# Patient Record
Sex: Male | Born: 1970 | Race: Black or African American | Hispanic: No | Smoking: Former smoker
Health system: Southern US, Community
[De-identification: ages and names within clinical notes are randomized; demographics above are authoritative.]

## PROBLEM LIST (undated history)

## (undated) DIAGNOSIS — N289 Disorder of kidney and ureter, unspecified: Secondary | ICD-10-CM

## (undated) DIAGNOSIS — G4730 Sleep apnea, unspecified: Secondary | ICD-10-CM

## (undated) DIAGNOSIS — J45909 Unspecified asthma, uncomplicated: Secondary | ICD-10-CM

## (undated) DIAGNOSIS — I10 Essential (primary) hypertension: Secondary | ICD-10-CM

## (undated) DIAGNOSIS — Z87442 Personal history of urinary calculi: Secondary | ICD-10-CM

## (undated) DIAGNOSIS — F32A Depression, unspecified: Secondary | ICD-10-CM

## (undated) DIAGNOSIS — D571 Sickle-cell disease without crisis: Secondary | ICD-10-CM

## (undated) DIAGNOSIS — I517 Cardiomegaly: Secondary | ICD-10-CM

## (undated) DIAGNOSIS — I739 Peripheral vascular disease, unspecified: Secondary | ICD-10-CM

## (undated) DIAGNOSIS — D573 Sickle-cell trait: Secondary | ICD-10-CM

## (undated) HISTORY — PX: NO PAST SURGERIES: SHX2092

---

## 1999-04-07 ENCOUNTER — Emergency Department (HOSPITAL_COMMUNITY): Admission: EM | Admit: 1999-04-07 | Discharge: 1999-04-07 | Payer: Self-pay | Admitting: Emergency Medicine

## 1999-11-30 ENCOUNTER — Encounter: Payer: Self-pay | Admitting: Internal Medicine

## 1999-11-30 ENCOUNTER — Emergency Department (HOSPITAL_COMMUNITY): Admission: EM | Admit: 1999-11-30 | Discharge: 1999-11-30 | Payer: Self-pay | Admitting: Internal Medicine

## 2000-07-22 ENCOUNTER — Emergency Department (HOSPITAL_COMMUNITY): Admission: EM | Admit: 2000-07-22 | Discharge: 2000-07-23 | Payer: Self-pay | Admitting: Emergency Medicine

## 2000-07-22 ENCOUNTER — Encounter: Payer: Self-pay | Admitting: Emergency Medicine

## 2015-03-12 ENCOUNTER — Emergency Department (HOSPITAL_COMMUNITY): Payer: Non-veteran care

## 2015-03-12 ENCOUNTER — Inpatient Hospital Stay (HOSPITAL_COMMUNITY)
Admission: EM | Admit: 2015-03-12 | Discharge: 2015-03-19 | DRG: 304 | Disposition: A | Discharge status: Home / Self Care | Payer: Non-veteran care | Attending: Internal Medicine | Admitting: Internal Medicine

## 2015-03-12 ENCOUNTER — Encounter (HOSPITAL_COMMUNITY): Payer: Self-pay | Admitting: Emergency Medicine

## 2015-03-12 DIAGNOSIS — M549 Dorsalgia, unspecified: Secondary | ICD-10-CM

## 2015-03-12 DIAGNOSIS — N9489 Other specified conditions associated with female genital organs and menstrual cycle: Secondary | ICD-10-CM

## 2015-03-12 DIAGNOSIS — R52 Pain, unspecified: Secondary | ICD-10-CM | POA: Diagnosis not present

## 2015-03-12 DIAGNOSIS — Z8249 Family history of ischemic heart disease and other diseases of the circulatory system: Secondary | ICD-10-CM

## 2015-03-12 DIAGNOSIS — F1210 Cannabis abuse, uncomplicated: Secondary | ICD-10-CM | POA: Diagnosis not present

## 2015-03-12 DIAGNOSIS — I10 Essential (primary) hypertension: Secondary | ICD-10-CM

## 2015-03-12 DIAGNOSIS — Z79899 Other long term (current) drug therapy: Secondary | ICD-10-CM | POA: Diagnosis not present

## 2015-03-12 DIAGNOSIS — R079 Chest pain, unspecified: Secondary | ICD-10-CM | POA: Diagnosis not present

## 2015-03-12 DIAGNOSIS — I129 Hypertensive chronic kidney disease with stage 1 through stage 4 chronic kidney disease, or unspecified chronic kidney disease: Secondary | ICD-10-CM | POA: Diagnosis present

## 2015-03-12 DIAGNOSIS — M5440 Lumbago with sciatica, unspecified side: Secondary | ICD-10-CM | POA: Diagnosis not present

## 2015-03-12 DIAGNOSIS — N289 Disorder of kidney and ureter, unspecified: Secondary | ICD-10-CM

## 2015-03-12 DIAGNOSIS — K5900 Constipation, unspecified: Secondary | ICD-10-CM | POA: Diagnosis present

## 2015-03-12 DIAGNOSIS — N186 End stage renal disease: Secondary | ICD-10-CM

## 2015-03-12 DIAGNOSIS — I701 Atherosclerosis of renal artery: Secondary | ICD-10-CM | POA: Diagnosis not present

## 2015-03-12 DIAGNOSIS — N182 Chronic kidney disease, stage 2 (mild): Secondary | ICD-10-CM | POA: Diagnosis present

## 2015-03-12 DIAGNOSIS — N179 Acute kidney failure, unspecified: Secondary | ICD-10-CM | POA: Diagnosis present

## 2015-03-12 DIAGNOSIS — D571 Sickle-cell disease without crisis: Secondary | ICD-10-CM | POA: Diagnosis present

## 2015-03-12 DIAGNOSIS — J45909 Unspecified asthma, uncomplicated: Secondary | ICD-10-CM | POA: Diagnosis present

## 2015-03-12 DIAGNOSIS — I7773 Dissection of renal artery: Secondary | ICD-10-CM | POA: Diagnosis present

## 2015-03-12 DIAGNOSIS — F1290 Cannabis use, unspecified, uncomplicated: Secondary | ICD-10-CM

## 2015-03-12 DIAGNOSIS — I160 Hypertensive urgency: Principal | ICD-10-CM | POA: Diagnosis present

## 2015-03-12 DIAGNOSIS — I776 Arteritis, unspecified: Secondary | ICD-10-CM

## 2015-03-12 HISTORY — DX: Sickle-cell disease without crisis: D57.1

## 2015-03-12 HISTORY — DX: Disorder of kidney and ureter, unspecified: N28.9

## 2015-03-12 HISTORY — DX: Unspecified asthma, uncomplicated: J45.909

## 2015-03-12 HISTORY — DX: Essential (primary) hypertension: I10

## 2015-03-12 LAB — URINALYSIS, ROUTINE W REFLEX MICROSCOPIC
Bilirubin Urine: NEGATIVE
GLUCOSE, UA: NEGATIVE mg/dL
Ketones, ur: NEGATIVE mg/dL
LEUKOCYTES UA: NEGATIVE
Nitrite: NEGATIVE
PH: 5.5 (ref 5.0–8.0)
Specific Gravity, Urine: 1.024 (ref 1.005–1.030)
Urobilinogen, UA: 0.2 mg/dL (ref 0.0–1.0)

## 2015-03-12 LAB — CREATININE, SERUM
CREATININE: 2.15 mg/dL — AB (ref 0.61–1.24)
GFR, EST AFRICAN AMERICAN: 41 mL/min — AB (ref >60)
GFR, EST NON AFRICAN AMERICAN: 35 mL/min — AB (ref >60)

## 2015-03-12 LAB — RAPID URINE DRUG SCREEN, HOSP PERFORMED
AMPHETAMINES: NOT DETECTED
BENZODIAZEPINES: NOT DETECTED
Barbiturates: NOT DETECTED
Cocaine: NOT DETECTED
Opiates: NOT DETECTED
TETRAHYDROCANNABINOL: POSITIVE — AB

## 2015-03-12 LAB — CBC WITH DIFFERENTIAL/PLATELET
BASOS ABS: 0 10*3/uL (ref 0.0–0.1)
Basophils Relative: 0 %
Eosinophils Absolute: 0.6 10*3/uL (ref 0.0–0.7)
Eosinophils Relative: 6 %
HEMATOCRIT: 44.9 % (ref 39.0–52.0)
Hemoglobin: 15.5 g/dL (ref 13.0–17.0)
LYMPHS ABS: 2.4 10*3/uL (ref 0.7–4.0)
LYMPHS PCT: 22 %
MCH: 29.3 pg (ref 26.0–34.0)
MCHC: 34.5 g/dL (ref 30.0–36.0)
MCV: 84.9 fL (ref 78.0–100.0)
Monocytes Absolute: 0.5 10*3/uL (ref 0.1–1.0)
Monocytes Relative: 5 %
NEUTROS ABS: 7.3 10*3/uL (ref 1.7–7.7)
Neutrophils Relative %: 67 %
Platelets: 276 10*3/uL (ref 150–400)
RBC: 5.29 MIL/uL (ref 4.22–5.81)
RDW: 12.1 % (ref 11.5–15.5)
WBC: 10.9 10*3/uL — ABNORMAL HIGH (ref 4.0–10.5)

## 2015-03-12 LAB — TSH: TSH: 1.597 u[IU]/mL (ref 0.350–4.500)

## 2015-03-12 LAB — CBC
HCT: 39.9 % (ref 39.0–52.0)
Hemoglobin: 13.6 g/dL (ref 13.0–17.0)
MCH: 29.3 pg (ref 26.0–34.0)
MCHC: 34.1 g/dL (ref 30.0–36.0)
MCV: 86 fL (ref 78.0–100.0)
PLATELETS: 242 10*3/uL (ref 150–400)
RBC: 4.64 MIL/uL (ref 4.22–5.81)
RDW: 12.1 % (ref 11.5–15.5)
WBC: 13.1 10*3/uL — AB (ref 4.0–10.5)

## 2015-03-12 LAB — URINE MICROSCOPIC-ADD ON

## 2015-03-12 LAB — MAGNESIUM: MAGNESIUM: 2 mg/dL (ref 1.7–2.4)

## 2015-03-12 LAB — I-STAT CHEM 8, ED
BUN: 24 mg/dL — ABNORMAL HIGH (ref 6–20)
CALCIUM ION: 1.1 mmol/L — AB (ref 1.12–1.23)
CHLORIDE: 100 mmol/L — AB (ref 101–111)
CREATININE: 1.8 mg/dL — AB (ref 0.61–1.24)
GLUCOSE: 95 mg/dL (ref 65–99)
HCT: 50 % (ref 39.0–52.0)
Hemoglobin: 17 g/dL (ref 13.0–17.0)
POTASSIUM: 3.7 mmol/L (ref 3.5–5.1)
Sodium: 138 mmol/L (ref 135–145)
TCO2: 29 mmol/L (ref 0–100)

## 2015-03-12 LAB — HEPATIC FUNCTION PANEL
ALBUMIN: 3.4 g/dL — AB (ref 3.5–5.0)
ALK PHOS: 48 U/L (ref 38–126)
ALT: 15 U/L — AB (ref 17–63)
AST: 18 U/L (ref 15–41)
BILIRUBIN TOTAL: 0.7 mg/dL (ref 0.3–1.2)
Bilirubin, Direct: <0.1 mg/dL — ABNORMAL LOW (ref 0.1–0.5)
TOTAL PROTEIN: 7 g/dL (ref 6.5–8.1)

## 2015-03-12 LAB — SEDIMENTATION RATE: SED RATE: 10 mm/h (ref 0–16)

## 2015-03-12 LAB — TROPONIN I: Troponin I: <0.03 ng/mL (ref <0.031)

## 2015-03-12 LAB — C-REACTIVE PROTEIN: CRP: 1.2 mg/dL — AB (ref <1.0)

## 2015-03-12 MED ORDER — LORAZEPAM 2 MG/ML IJ SOLN: 1.0000 mg | Freq: Four times a day (QID) | INTRAMUSCULAR | Status: AC | PRN

## 2015-03-12 MED ORDER — NITROGLYCERIN IN D5W 200-5 MCG/ML-% IV SOLN
5.0000 ug/min | INTRAVENOUS | Status: DC
  Administered 2015-03-12: 20 ug/min via INTRAVENOUS
  Filled 2015-03-12: qty 250

## 2015-03-12 MED ORDER — SODIUM CHLORIDE 0.9 % IV SOLN
INTRAVENOUS | Status: DC
  Administered 2015-03-12 – 2015-03-13 (×2): via INTRAVENOUS

## 2015-03-12 MED ORDER — MOMETASONE FURO-FORMOTEROL FUM 100-5 MCG/ACT IN AERO
2.0000 | INHALATION_SPRAY | Freq: Two times a day (BID) | RESPIRATORY_TRACT | Status: DC
  Administered 2015-03-13 – 2015-03-19 (×11): 2 via RESPIRATORY_TRACT
  Filled 2015-03-12 (×2): qty 8.8

## 2015-03-12 MED ORDER — SODIUM CHLORIDE 0.9 % IJ SOLN
3.0000 mL | Freq: Two times a day (BID) | INTRAMUSCULAR | Status: DC
  Administered 2015-03-12 – 2015-03-18 (×8): 3 mL via INTRAVENOUS

## 2015-03-12 MED ORDER — MOMETASONE FURO-FORMOTEROL FUM 100-5 MCG/ACT IN AERO
2.0000 | INHALATION_SPRAY | Freq: Two times a day (BID) | RESPIRATORY_TRACT | Status: DC
  Filled 2015-03-12: qty 8.8

## 2015-03-12 MED ORDER — TAMSULOSIN HCL 0.4 MG PO CAPS
0.4000 mg | ORAL_CAPSULE | Freq: Every day | ORAL | Status: DC
  Administered 2015-03-13 – 2015-03-14 (×2): 0.4 mg via ORAL
  Filled 2015-03-12 (×2): qty 1

## 2015-03-12 MED ORDER — HEPARIN SODIUM (PORCINE) 5000 UNIT/ML IJ SOLN: 5000.0000 [IU] | Freq: Three times a day (TID) | INTRAMUSCULAR | Status: DC

## 2015-03-12 MED ORDER — HYDROMORPHONE HCL 1 MG/ML IJ SOLN
0.5000 mg | INTRAMUSCULAR | Status: DC | PRN
  Administered 2015-03-13 – 2015-03-19 (×20): 0.5 mg via INTRAVENOUS
  Filled 2015-03-12 (×20): qty 1

## 2015-03-12 MED ORDER — ADULT MULTIVITAMIN W/MINERALS CH
1.0000 | ORAL_TABLET | Freq: Every day | ORAL | Status: DC
  Administered 2015-03-12 – 2015-03-19 (×8): 1 via ORAL
  Filled 2015-03-12 (×8): qty 1

## 2015-03-12 MED ORDER — ALBUTEROL SULFATE (2.5 MG/3ML) 0.083% IN NEBU: 3.0000 mL | INHALATION_SOLUTION | Freq: Four times a day (QID) | RESPIRATORY_TRACT | Status: DC | PRN

## 2015-03-12 MED ORDER — OMEGA-3-ACID ETHYL ESTERS 1 G PO CAPS
1.0000 g | ORAL_CAPSULE | Freq: Two times a day (BID) | ORAL | Status: DC
  Administered 2015-03-12 – 2015-03-19 (×14): 1 g via ORAL
  Filled 2015-03-12 (×17): qty 1

## 2015-03-12 MED ORDER — HYDRALAZINE HCL 50 MG PO TABS
100.0000 mg | ORAL_TABLET | Freq: Three times a day (TID) | ORAL | Status: DC
  Administered 2015-03-12 – 2015-03-19 (×21): 100 mg via ORAL
  Filled 2015-03-12 (×22): qty 2

## 2015-03-12 MED ORDER — HEPARIN (PORCINE) IN NACL 100-0.45 UNIT/ML-% IJ SOLN
1800.0000 [IU]/h | INTRAMUSCULAR | Status: DC
  Administered 2015-03-12: 1100 [IU]/h via INTRAVENOUS
  Administered 2015-03-13: 1300 [IU]/h via INTRAVENOUS
  Administered 2015-03-13: 1350 [IU]/h via INTRAVENOUS
  Administered 2015-03-14: 1750 [IU]/h via INTRAVENOUS
  Administered 2015-03-14: 1450 [IU]/h via INTRAVENOUS
  Administered 2015-03-16 – 2015-03-19 (×5): 1800 [IU]/h via INTRAVENOUS
  Filled 2015-03-12 (×11): qty 250

## 2015-03-12 MED ORDER — LABETALOL HCL 5 MG/ML IV SOLN
20.0000 mg | INTRAVENOUS | Status: AC | PRN
  Administered 2015-03-12 (×5): 20 mg via INTRAVENOUS
  Filled 2015-03-12 (×5): qty 4

## 2015-03-12 MED ORDER — ONDANSETRON HCL 4 MG PO TABS: 4.0000 mg | ORAL_TABLET | Freq: Four times a day (QID) | ORAL | Status: DC | PRN

## 2015-03-12 MED ORDER — LABETALOL HCL 5 MG/ML IV SOLN
5.0000 mg | INTRAVENOUS | Status: DC | PRN
  Administered 2015-03-13 – 2015-03-15 (×7): 5 mg via INTRAVENOUS
  Filled 2015-03-12 (×7): qty 4

## 2015-03-12 MED ORDER — ONDANSETRON HCL 4 MG/2ML IJ SOLN
4.0000 mg | Freq: Four times a day (QID) | INTRAMUSCULAR | Status: DC | PRN
Start: 2015-03-12 — End: 2015-03-19
  Filled 2015-03-12 (×2): qty 2

## 2015-03-12 MED ORDER — THIAMINE HCL 100 MG/ML IJ SOLN
100.0000 mg | Freq: Every day | INTRAMUSCULAR | Status: DC
  Filled 2015-03-12: qty 2

## 2015-03-12 MED ORDER — IOHEXOL 350 MG/ML SOLN
100.0000 mL | Freq: Once | INTRAVENOUS | Status: AC | PRN
  Administered 2015-03-12: 80 mL via INTRAVENOUS

## 2015-03-12 MED ORDER — VITAMIN B-1 100 MG PO TABS
100.0000 mg | ORAL_TABLET | Freq: Every day | ORAL | Status: DC
  Administered 2015-03-12 – 2015-03-19 (×8): 100 mg via ORAL
  Filled 2015-03-12 (×8): qty 1

## 2015-03-12 MED ORDER — LORAZEPAM 1 MG PO TABS: 1.0000 mg | ORAL_TABLET | Freq: Four times a day (QID) | ORAL | Status: AC | PRN

## 2015-03-12 MED ORDER — FOLIC ACID 1 MG PO TABS
1.0000 mg | ORAL_TABLET | Freq: Every day | ORAL | Status: DC
  Administered 2015-03-12 – 2015-03-19 (×8): 1 mg via ORAL
  Filled 2015-03-12 (×8): qty 1

## 2015-03-12 NOTE — ED Notes (Signed)
Report attempted 

## 2015-03-12 NOTE — H&P (Addendum)
[facility] History and Physical  Dylan Sweeney [id number] DOB: 08/10/1970 DOA: 03/12/2015  Referring physician: ER  PCP: No primary care provider on file.   Chief Complaint: Lower back pain  HPI:  44 year old male with a history of chronic kidney disease stage II followed at the [facility] in [place], hypertension, asthma which is under control, presents to the ER with left flank pain. Blood pressure upon arrival was 222/148. Flank pain is described as dull, constant pain, not particularly worse with movement, not reproducible to palpation. Denies any history of nephrolithiasis. Patient received 5 doses of IV labetalol, subsequently started on a nitroglycerin drip. CT scan which was performed to rule out dissection showed an irregularity and focal narrowing of the left renal artery with surrounding fat stranding. Findings nonspecific but ddx include renal artery dissection or vasculitis. Patient has a history of sickle cell trait, admits to using marijuana, denies cocaine use, drinks beer on a daily basis.       Review of Systems: negative for the following  Constitutional: Denies fever, chills, diaphoresis, appetite change and fatigue.  HEENT: Denies photophobia, eye pain, redness, hearing loss, ear pain, congestion, sore throat, rhinorrhea, sneezing, mouth sores, trouble swallowing, neck pain, neck stiffness and tinnitus.  Respiratory: Denies SOB, DOE, cough, chest tightness, and wheezing.  Cardiovascular: Denies chest pain, palpitations and leg swelling.  Gastrointestinal: Denies nausea, vomiting, abdominal pain, diarrhea, constipation, blood in stool and abdominal distention.  Genitourinary: Denies dysuria, urgency, frequency, hematuria, flank pain and difficulty urinating.  Musculoskeletal: Positive for back pain and neck stiffness. Negative for myalgias and arthralgias.  Skin: Denies pallor, rash and wound.  Neurological: Denies dizziness, seizures, syncope,  weakness, light-headedness, numbness and headaches.  Hematological: Denies adenopathy. Easy bruising, personal or family bleeding history  Psychiatric/Behavioral: Denies suicidal ideation, mood changes, confusion, nervousness, sleep disturbance and agitation       Past Medical History  Diagnosis Date  . Hypertension   . Renal disorder   . Asthma   . Renal insufficiency   . Sickle cell anemia (HCC)     Has the trait-per patient (03/12/15)     History reviewed. No pertinent past surgical history.    Social History:  reports that he has never smoked. He does not have any smokeless tobacco history on file. He reports that he drinks alcohol. He reports that he does not use illicit drugs.    Allergies  Allergen Reactions  . Other Other (See Comments)    G6 pill allergy: pill given before travel in [place]  When questioned  Directly-patient reports  No family history of HTN, CVA ,DIABETES, TB, Cancer CAD, Bleeding Disorder , diabetes, anemia, asthma,  Positive for sickle cell disease in his son and mother   Prior to Admission medications   Medication Sig Start Date End Date Taking? Authorizing Provider  albuterol (PROVENTIL HFA;VENTOLIN HFA) 108 (90 BASE) MCG/ACT inhaler Inhale into the lungs every 6 (six) hours as needed for wheezing or shortness of breath.   Yes Historical Provider, MD  cholecalciferol (VITAMIN D) 1000 UNITS tablet Take 1,000 Units by mouth daily.   Yes Historical Provider, MD  Fluticasone-Salmeterol (ADVAIR) 250-50 MCG/DOSE AEPB Inhale 1 puff into the lungs 2 (two) times daily.   Yes Historical Provider, MD  hydrALAZINE (APRESOLINE) 25 MG tablet Take 25 mg by mouth 2 (two) times daily.   Yes Historical Provider, MD  hydrochlorothiazide (HYDRODIURIL) 25 MG tablet Take 25 mg by mouth daily.  Yes Historical Provider, MD  losartan (COZAAR) 50 MG tablet Take 50 mg by mouth daily.   Yes Historical Provider, MD  omega-3 acid ethyl esters  (LOVAZA) 1 G capsule Take by mouth 2 (two) times daily.   Yes Historical Provider, MD  spironolactone (ALDACTONE) 50 MG tablet Take 50 mg by mouth daily.   Yes Historical Provider, MD  tamsulosin (FLOMAX) 0.4 MG CAPS capsule Take 0.4 mg by mouth.   Yes Historical Provider, MD     Physical Exam: Filed Vitals:   03/12/15 1830 03/12/15 1845 03/12/15 1900 03/12/15 1915  BP: 184/151 170/127 173/125 175/130  Pulse: 84 87 88 84  Temp:      TempSrc:      Resp:      Height:      Weight:      SpO2: 92% 91% 91% 95%    Constitutional: He is oriented to person, place, and time. He appears well-developed and well-nourished.  No acute distress  Neck:  Full ROM without pain No meningeal signs No midline tenderness Mild tenderness of paraspinal musculature  Cardiovascular: Normal rate, regular rhythm, normal heart sounds and intact distal pulses. Exam reveals no gallop and no friction rub.  No murmur heard. Pulmonary/Chest: Effort normal and breath sounds normal. No respiratory distress. He has no wheezes. He has no rales.  Abdominal: Soft. Bowel sounds are normal. He exhibits no distension. There is no tenderness.  Musculoskeletal:  Gait is not antalgic; patient is able to ambulate without difficulty.  No noted deformities or signs of inflammation. Curvature of cervical, thoracic, and lumbar spine within normal limits. No midline tenderness; no tenderness to palpation of lumbar paraspinal musculature Full lumbar ROM without pain Straight leg raises are negative bilaterally for radicular symptoms. 5/5 muscle strength of bilateral LE's  Neurological: He is alert and oriented to person, place, and time. He has normal reflexes.  Bilateral lower extremities neurovascularly intact.  Neurological: A&O x3, Strenght is normal and symmetric bilaterally, cranial nerve II-XII are grossly intact, no focal motor deficit, sensory intact to light touch bilaterally.  Skin: Warm, dry and intact. No rash,  cyanosis, or clubbing.  Psychiatric: Normal mood and affect. speech and behavior is normal. Judgment and thought content normal. Cognition and memory are normal.      Data Review   Micro Results No results found for this or any previous visit (from the past 240 hour(s)).  Radiology Reports Ct Angio Chest Aorta W/cm &/or Wo/cm  03/12/2015  CLINICAL DATA:  Patient with back pain. Evaluate for aortic dissection. EXAM: CT ANGIOGRAPHY CHEST, ABDOMEN AND PELVIS TECHNIQUE: Multidetector CT imaging of the chest, abdomen and pelvis was performed using the standard protocol during bolus administration of intravenous contrast. Multiplanar CT image reconstructions and MIPs were obtained to evaluate the vascular anatomy. CONTRAST:  53mL OMNIPAQUE IOHEXOL 350 MG/ML SOLN COMPARISON:  None. FINDINGS: CTA CHEST FINDINGS Mediastinum/Nodes: Noncontrast imaging through the thorax demonstrates no peripheral high attenuation within the thoracic aorta to suggest intramural hematoma formation. Post contrast-enhanced images demonstrate motion artifact limiting evaluation of the aortic root. The thoracic aorta is opacified with contrast material. No evidence for acute thoracic aortic dissection. The takeoff of the great vessels are patent. Visualized thyroid is unremarkable. No enlarged axillary, mediastinal or hilar lymphadenopathy. Heart is normal in size. No pericardial effusion. Aorta and main pulmonary artery normal in caliber. No evidence for central pulmonary embolism. Coronary arterial vascular calcifications. Lungs/Pleura: The central airways are patent. No consolidative pulmonary opacities. No pleural effusion or  pneumothorax. Review of the MIP images confirms the above findings. CTA ABDOMEN FINDINGS Hepatobiliary: Liver is normal in size and contour. No focal hepatic lesion is identified. The gallbladder is unremarkable. No intrahepatic or extrahepatic biliary ductal dilatation. Pancreas: Unremarkable Spleen:  Unremarkable Adrenals/Urinary Tract: The adrenal glands are normal. The kidneys enhance symmetrically with contrast. Multiple bilateral low-attenuation subcentimeter renal lesions are demonstrated, too small to accurately characterize. There is circumferential wall thickening of the urinary bladder. Stomach/Bowel: No abnormal bowel wall thickening or evidence for bowel obstruction. No free fluid or free intraperitoneal air. No evidence for bowel obstruction. Vascular/Lymphatic: The abdominal aorta is patent and normal in caliber. There is fat stranding about the left renal artery with apparent focal areas of narrowing, demonstrated best on sagittal images (image 103; series 503). The branch vessels including the celiac, superior mesenteric and inferior mesenteric arteries are patent. The common iliac vessels as well as the internal and external iliac vessels are patent. Other: Prostate enlarged. Musculoskeletal: No aggressive or acute appearing osseous lesions. Review of the MIP images confirms the above findings. IMPRESSION: There is irregularity and focal narrowing of the left renal artery with with surrounding fat stranding. There is some motion artifact limiting evaluation of this location. Overall findings are nonspecific however the top differential considerations would be focal left renal artery dissection or vasculitis. Additional but less likely consideration would be left renal artery infection in the setting of pyelonephritis. On single phase contrast-enhanced exam, the left kidney appears perfused. Critical Value/emergent results were called by telephone at the time of interpretation on 03/12/2015 at 3:28 pm to Dr. Eulis Foster, who verbally acknowledged these results. Electronically Signed   By: Lovey Newcomer M.D.   On: 03/12/2015 15:30   Ct Angio Abd/pel W/ And/or W/o  03/12/2015  CLINICAL DATA:  Patient with back pain. Evaluate for aortic dissection. EXAM: CT ANGIOGRAPHY CHEST, ABDOMEN AND PELVIS TECHNIQUE:  Multidetector CT imaging of the chest, abdomen and pelvis was performed using the standard protocol during bolus administration of intravenous contrast. Multiplanar CT image reconstructions and MIPs were obtained to evaluate the vascular anatomy. CONTRAST:  77mL OMNIPAQUE IOHEXOL 350 MG/ML SOLN COMPARISON:  None. FINDINGS: CTA CHEST FINDINGS Mediastinum/Nodes: Noncontrast imaging through the thorax demonstrates no peripheral high attenuation within the thoracic aorta to suggest intramural hematoma formation. Post contrast-enhanced images demonstrate motion artifact limiting evaluation of the aortic root. The thoracic aorta is opacified with contrast material. No evidence for acute thoracic aortic dissection. The takeoff of the great vessels are patent. Visualized thyroid is unremarkable. No enlarged axillary, mediastinal or hilar lymphadenopathy. Heart is normal in size. No pericardial effusion. Aorta and main pulmonary artery normal in caliber. No evidence for central pulmonary embolism. Coronary arterial vascular calcifications. Lungs/Pleura: The central airways are patent. No consolidative pulmonary opacities. No pleural effusion or pneumothorax. Review of the MIP images confirms the above findings. CTA ABDOMEN FINDINGS Hepatobiliary: Liver is normal in size and contour. No focal hepatic lesion is identified. The gallbladder is unremarkable. No intrahepatic or extrahepatic biliary ductal dilatation. Pancreas: Unremarkable Spleen: Unremarkable Adrenals/Urinary Tract: The adrenal glands are normal. The kidneys enhance symmetrically with contrast. Multiple bilateral low-attenuation subcentimeter renal lesions are demonstrated, too small to accurately characterize. There is circumferential wall thickening of the urinary bladder. Stomach/Bowel: No abnormal bowel wall thickening or evidence for bowel obstruction. No free fluid or free intraperitoneal air. No evidence for bowel obstruction. Vascular/Lymphatic: The  abdominal aorta is patent and normal in caliber. There is fat stranding about the left renal artery  with apparent focal areas of narrowing, demonstrated best on sagittal images (image 103; series 503). The branch vessels including the celiac, superior mesenteric and inferior mesenteric arteries are patent. The common iliac vessels as well as the internal and external iliac vessels are patent. Other: Prostate enlarged. Musculoskeletal: No aggressive or acute appearing osseous lesions. Review of the MIP images confirms the above findings. IMPRESSION: There is irregularity and focal narrowing of the left renal artery with with surrounding fat stranding. There is some motion artifact limiting evaluation of this location. Overall findings are nonspecific however the top differential considerations would be focal left renal artery dissection or vasculitis. Additional but less likely consideration would be left renal artery infection in the setting of pyelonephritis. On single phase contrast-enhanced exam, the left kidney appears perfused. Critical Value/emergent results were called by telephone at the time of interpretation on 03/12/2015 at 3:28 pm to Dr. Eulis Foster, who verbally acknowledged these results. Electronically Signed   By: Lovey Newcomer M.D.   On: 03/12/2015 15:30     CBC  Recent Labs Lab 03/12/15 1335 03/12/15 1345  WBC 10.9*  --   HGB 15.5 17.0  HCT 44.9 50.0  PLT 276  --   MCV 84.9  --   MCH 29.3  --   MCHC 34.5  --   RDW 12.1  --   LYMPHSABS 2.4  --   MONOABS 0.5  --   EOSABS 0.6  --   BASOSABS 0.0  --     Chemistries   Recent Labs Lab 03/12/15 1345  NA 138  K 3.7  CL 100*  GLUCOSE 95  BUN 24*  CREATININE 1.80*   ------------------------------------------------------------------------------------------------------------------ estimated creatinine clearance is 54.6 mL/min (by C-G formula based on Cr of  1.8). ------------------------------------------------------------------------------------------------------------------ No results for input(s): HGBA1C in the last 72 hours. ------------------------------------------------------------------------------------------------------------------ No results for input(s): CHOL, HDL, LDLCALC, TRIG, CHOLHDL, LDLDIRECT in the last 72 hours. ------------------------------------------------------------------------------------------------------------------ No results for input(s): TSH, T4TOTAL, T3FREE, THYROIDAB in the last 72 hours.  Invalid input(s): FREET3 ------------------------------------------------------------------------------------------------------------------ No results for input(s): VITAMINB12, FOLATE, FERRITIN, TIBC, IRON, RETICCTPCT in the last 72 hours.  Coagulation profile No results for input(s): INR, PROTIME in the last 168 hours.  No results for input(s): DDIMER in the last 72 hours.  Cardiac Enzymes No results for input(s): CKMB, TROPONINI, MYOGLOBIN in the last 168 hours.  Invalid input(s): CK ------------------------------------------------------------------------------------------------------------------ Invalid input(s): POCBNP   CBG: No results for input(s): GLUCAP in the last 168 hours.     EKG: Independently reviewed.    Assessment/Plan Active Problems:    Hypertensive urgency, malignant-in the setting of known history of hypertension, patient on HCTZ, Cozaar, Aldactone, hydralazine at home. He states that he has been compliant with his medications. CT imaging concerning for focal narrowing of the left renal artery, concern for focal left renal artery dissection vs vasculitis. Will obtain nephrology consultation, start MRA of the renal arteries to further evaluate for renal artery dissection, as well as order a vasculitic workup including antibody panel for   vasculitis. Increased dose of hydralazine, continue when  necessary labetalol, taper off nitroglycerin drip Admit patient to step down I have   called Dr. Florene Glen , he recommended to call vascular , also discussed with Dr Trula Slade for further recommendations.he does not recommend MRI/MRA tonight. He recommends anticoagulation with heparin, and repeat a scan in 48 hours to confirm findings. He will see the patient in the morning. I have also paged Dr. Daryll Brod with interventional radiology, to consult and  make further recommendations,  He recommends anticoagulation, will start heparin and wait for further recommendations from IR and vascular .    Marijuana use-counseling done  Asthma-continue Advair, albuterol,  Alcohol abuse patient started on CIWA protocol  Code Status:   full Family Communication: bedside Disposition Plan: admit   Total time spent 55 minutes.Greater than 50% of this time was spent in counseling, explanation of diagnosis, planning of further management, and coordination of care  [facility] Pager [phone]  If 7PM-7AM, please contact night-coverage [email or web address] Password [facility] 03/12/2015, 7:41 PM

## 2015-03-12 NOTE — Progress Notes (Signed)
ANTICOAGULATION CONSULT NOTE - Initial Consult  Pharmacy Consult for Heparin Indication: Renal artery dissection  Allergies  Allergen Reactions  . Other Other (See Comments)    G6 pill allergy: pill given before travel in [place]    Patient Measurements: Height: 5\' 11"  (180.3 cm) Weight: 190 lb (86.183 kg) IBW/kg (Calculated) : 75.3   Vital Signs: Temp: 98.2 F (36.8 C) (11/08 1141) Temp Source: Oral (11/08 1141) BP: 153/115 mmHg (11/08 2015) Pulse Rate: 82 (11/08 2015)  Labs:  Recent Labs  03/12/15 1335 03/12/15 1345  HGB 15.5 17.0  HCT 44.9 50.0  PLT 276  --   CREATININE  --  1.80*    Estimated Creatinine Clearance: 54.6 mL/min (by C-G formula based on Cr of 1.8).   Medical History: Past Medical History  Diagnosis Date  . Hypertension   . Renal disorder   . Asthma   . Renal insufficiency   . Sickle cell anemia (HCC)     Has the trait-per patient (03/12/15)    Assessment: 44 year old male with a history of chronic kidney disease stage II  hypertension, asthma,  presented to the ER with left flank pain.  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

## 2015-03-12 NOTE — ED Provider Notes (Signed)
CSN: [id number]     Arrival date & time 03/12/15  1114 History   First MD Initiated Contact with Patient 03/12/15 1302     Chief Complaint  Patient presents with  . Hypertension  . Flank Pain     (Consider location/radiation/quality/duration/timing/severity/associated sxs/prior Treatment) The history is provided by the patient and medical records. No language interpreter was used.   KEDRIC SETTERS is a 44 y.o. male  with a PMH of HTN, CKD stage 2 presents to the Emergency Department complaining of constant, dull, achy left lower back pain that has been worsening since Friday (11/04). Patient states he has a history of low back pain in the past, however this feels different from his usual symptoms. 10/10 at worst, 5/10 at present. Associated symptoms include neck stiffness. Of note, patient missed his home regimen of blood pressure medications this morning.     Past Medical History  Diagnosis Date  . Hypertension   . Renal disorder   . Asthma   . Renal insufficiency   . Sickle cell anemia (HCC)     Has the trait-per patient (03/12/15)   History reviewed. No pertinent past surgical history. No family history on file. Social History  Substance Use Topics  . Smoking status: Never Smoker   . Smokeless tobacco: None  . Alcohol Use: Yes     Comment: 6 pack/week    Review of Systems  Constitutional: Negative.   HENT: Negative for congestion, rhinorrhea and sore throat.   Eyes: Negative for visual disturbance.  Respiratory: Negative for cough, shortness of breath and wheezing.   Cardiovascular: Negative.   Gastrointestinal: Negative for nausea, vomiting, abdominal pain, diarrhea and constipation.  Endocrine: Negative for polydipsia and polyuria.  Musculoskeletal: Positive for back pain and neck stiffness. Negative for myalgias and arthralgias.  Skin: Negative for rash.  Neurological: Negative for dizziness, weakness and headaches.      Allergies  Other  Home Medications    Prior to Admission medications   Medication Sig Start Date End Date Taking? Authorizing Provider  albuterol (PROVENTIL HFA;VENTOLIN HFA) 108 (90 BASE) MCG/ACT inhaler Inhale into the lungs every 6 (six) hours as needed for wheezing or shortness of breath.   Yes Historical Provider, MD  cholecalciferol (VITAMIN D) 1000 UNITS tablet Take 1,000 Units by mouth daily.   Yes Historical Provider, MD  Fluticasone-Salmeterol (ADVAIR) 250-50 MCG/DOSE AEPB Inhale 1 puff into the lungs 2 (two) times daily.   Yes Historical Provider, MD  hydrALAZINE (APRESOLINE) 25 MG tablet Take 25 mg by mouth 2 (two) times daily.   Yes Historical Provider, MD  hydrochlorothiazide (HYDRODIURIL) 25 MG tablet Take 25 mg by mouth daily.   Yes Historical Provider, MD  losartan (COZAAR) 50 MG tablet Take 50 mg by mouth daily.   Yes Historical Provider, MD  omega-3 acid ethyl esters (LOVAZA) 1 G capsule Take by mouth 2 (two) times daily.   Yes Historical Provider, MD  spironolactone (ALDACTONE) 50 MG tablet Take 50 mg by mouth daily.   Yes Historical Provider, MD  tamsulosin (FLOMAX) 0.4 MG CAPS capsule Take 0.4 mg by mouth.   Yes Historical Provider, MD   BP 186/128 mmHg  Pulse 88  Temp(Src) 98.2 F (36.8 C) (Oral)  Resp 18  Ht 5\' 11"  (1.803 m)  Wt 190 lb (86.183 kg)  BMI 26.51 kg/m2  SpO2 92% Physical Exam  Constitutional: He is oriented to person, place, and time. He appears well-developed and well-nourished.  No acute distress  Neck:  Full ROM without pain No meningeal signs No midline tenderness Mild tenderness of paraspinal musculature  Cardiovascular: Normal rate, regular rhythm, normal heart sounds and intact distal pulses.  Exam reveals no gallop and no friction rub.   No murmur heard. Pulmonary/Chest: Effort normal and breath sounds normal. No respiratory distress. He has no wheezes. He has no rales.  Abdominal: Soft. Bowel sounds are normal. He exhibits no distension. There is no tenderness.   Musculoskeletal:  Gait is not antalgic; patient is able to ambulate without difficulty.  No noted deformities or signs of inflammation. Curvature of cervical, thoracic, and lumbar spine within normal limits. No midline tenderness; no tenderness to palpation of lumbar paraspinal musculature Full lumbar ROM without pain Straight leg raises are negative bilaterally for radicular symptoms. 5/5 muscle strength of bilateral LE's   Neurological: He is alert and oriented to person, place, and time. He has normal reflexes.  Bilateral lower extremities neurovascularly intact.  Skin: Skin is warm and dry. No rash noted. No erythema.  Nursing note and vitals reviewed.   ED Course  Procedures (including critical care time) Labs Review Labs Reviewed  CBC WITH DIFFERENTIAL/PLATELET - Abnormal; Notable for the following:    WBC 10.9 (*)    All other components within normal limits  URINALYSIS, ROUTINE W REFLEX MICROSCOPIC (NOT AT [facility]) - Abnormal; Notable for the following:    Hgb urine dipstick MODERATE (*)    Protein, ur >300 (*)    All other components within normal limits  URINE RAPID DRUG SCREEN, HOSP PERFORMED - Abnormal; Notable for the following:    Tetrahydrocannabinol POSITIVE (*)    All other components within normal limits  I-STAT CHEM 8, ED - Abnormal; Notable for the following:    Chloride 100 (*)    BUN 24 (*)    Creatinine, Ser 1.80 (*)    Calcium, Ion 1.10 (*)    All other components within normal limits  URINE MICROSCOPIC-ADD ON    Imaging Review Ct Angio Chest Aorta W/cm &/or Wo/cm  03/12/2015  CLINICAL DATA:  Patient with back pain. Evaluate for aortic dissection. EXAM: CT ANGIOGRAPHY CHEST, ABDOMEN AND PELVIS TECHNIQUE: Multidetector CT imaging of the chest, abdomen and pelvis was performed using the standard protocol during bolus administration of intravenous contrast. Multiplanar CT image reconstructions and MIPs were obtained to evaluate the vascular anatomy.  CONTRAST:  46mL OMNIPAQUE IOHEXOL 350 MG/ML SOLN COMPARISON:  None. FINDINGS: CTA CHEST FINDINGS Mediastinum/Nodes: Noncontrast imaging through the thorax demonstrates no peripheral high attenuation within the thoracic aorta to suggest intramural hematoma formation. Post contrast-enhanced images demonstrate motion artifact limiting evaluation of the aortic root. The thoracic aorta is opacified with contrast material. No evidence for acute thoracic aortic dissection. The takeoff of the great vessels are patent. Visualized thyroid is unremarkable. No enlarged axillary, mediastinal or hilar lymphadenopathy. Heart is normal in size. No pericardial effusion. Aorta and main pulmonary artery normal in caliber. No evidence for central pulmonary embolism. Coronary arterial vascular calcifications. Lungs/Pleura: The central airways are patent. No consolidative pulmonary opacities. No pleural effusion or pneumothorax. Review of the MIP images confirms the above findings. CTA ABDOMEN FINDINGS Hepatobiliary: Liver is normal in size and contour. No focal hepatic lesion is identified. The gallbladder is unremarkable. No intrahepatic or extrahepatic biliary ductal dilatation. Pancreas: Unremarkable Spleen: Unremarkable Adrenals/Urinary Tract: The adrenal glands are normal. The kidneys enhance symmetrically with contrast. Multiple bilateral low-attenuation subcentimeter renal lesions are demonstrated, too small to accurately characterize. There is circumferential wall thickening of  the urinary bladder. Stomach/Bowel: No abnormal bowel wall thickening or evidence for bowel obstruction. No free fluid or free intraperitoneal air. No evidence for bowel obstruction. Vascular/Lymphatic: The abdominal aorta is patent and normal in caliber. There is fat stranding about the left renal artery with apparent focal areas of narrowing, demonstrated best on sagittal images (image 103; series 503). The branch vessels including the celiac, superior  mesenteric and inferior mesenteric arteries are patent. The common iliac vessels as well as the internal and external iliac vessels are patent. Other: Prostate enlarged. Musculoskeletal: No aggressive or acute appearing osseous lesions. Review of the MIP images confirms the above findings. IMPRESSION: There is irregularity and focal narrowing of the left renal artery with with surrounding fat stranding. There is some motion artifact limiting evaluation of this location. Overall findings are nonspecific however the top differential considerations would be focal left renal artery dissection or vasculitis. Additional but less likely consideration would be left renal artery infection in the setting of pyelonephritis. On single phase contrast-enhanced exam, the left kidney appears perfused. Critical Value/emergent results were called by telephone at the time of interpretation on 03/12/2015 at 3:28 pm to Dr. Eulis Foster, who verbally acknowledged these results. Electronically Signed   By: Lovey Newcomer M.D.   On: 03/12/2015 15:30   Ct Angio Abd/pel W/ And/or W/o  03/12/2015  CLINICAL DATA:  Patient with back pain. Evaluate for aortic dissection. EXAM: CT ANGIOGRAPHY CHEST, ABDOMEN AND PELVIS TECHNIQUE: Multidetector CT imaging of the chest, abdomen and pelvis was performed using the standard protocol during bolus administration of intravenous contrast. Multiplanar CT image reconstructions and MIPs were obtained to evaluate the vascular anatomy. CONTRAST:  66mL OMNIPAQUE IOHEXOL 350 MG/ML SOLN COMPARISON:  None. FINDINGS: CTA CHEST FINDINGS Mediastinum/Nodes: Noncontrast imaging through the thorax demonstrates no peripheral high attenuation within the thoracic aorta to suggest intramural hematoma formation. Post contrast-enhanced images demonstrate motion artifact limiting evaluation of the aortic root. The thoracic aorta is opacified with contrast material. No evidence for acute thoracic aortic dissection. The takeoff of the  great vessels are patent. Visualized thyroid is unremarkable. No enlarged axillary, mediastinal or hilar lymphadenopathy. Heart is normal in size. No pericardial effusion. Aorta and main pulmonary artery normal in caliber. No evidence for central pulmonary embolism. Coronary arterial vascular calcifications. Lungs/Pleura: The central airways are patent. No consolidative pulmonary opacities. No pleural effusion or pneumothorax. Review of the MIP images confirms the above findings. CTA ABDOMEN FINDINGS Hepatobiliary: Liver is normal in size and contour. No focal hepatic lesion is identified. The gallbladder is unremarkable. No intrahepatic or extrahepatic biliary ductal dilatation. Pancreas: Unremarkable Spleen: Unremarkable Adrenals/Urinary Tract: The adrenal glands are normal. The kidneys enhance symmetrically with contrast. Multiple bilateral low-attenuation subcentimeter renal lesions are demonstrated, too small to accurately characterize. There is circumferential wall thickening of the urinary bladder. Stomach/Bowel: No abnormal bowel wall thickening or evidence for bowel obstruction. No free fluid or free intraperitoneal air. No evidence for bowel obstruction. Vascular/Lymphatic: The abdominal aorta is patent and normal in caliber. There is fat stranding about the left renal artery with apparent focal areas of narrowing, demonstrated best on sagittal images (image 103; series 503). The branch vessels including the celiac, superior mesenteric and inferior mesenteric arteries are patent. The common iliac vessels as well as the internal and external iliac vessels are patent. Other: Prostate enlarged. Musculoskeletal: No aggressive or acute appearing osseous lesions. Review of the MIP images confirms the above findings. IMPRESSION: There is irregularity and focal narrowing of the left renal  artery with with surrounding fat stranding. There is some motion artifact limiting evaluation of this location. Overall  findings are nonspecific however the top differential considerations would be focal left renal artery dissection or vasculitis. Additional but less likely consideration would be left renal artery infection in the setting of pyelonephritis. On single phase contrast-enhanced exam, the left kidney appears perfused. Critical Value/emergent results were called by telephone at the time of interpretation on 03/12/2015 at 3:28 pm to Dr. Eulis Foster, who verbally acknowledged these results. Electronically Signed   By: Lovey Newcomer M.D.   On: 03/12/2015 15:30   I have personally reviewed and evaluated these images and lab results as part of my medical decision-making.   EKG Interpretation None      MDM   Final diagnoses:  Back pain  Hypertensive urgency  Vasculitis ([place])  Renal insufficiency   [name] presents with ~ 4 days of worsening back pain that is not reproducible.  BP is markedly elevated: 208-222/148-153 -- Will order CT to r/o dissection   Labs: UA: > 300 protein, moderate hgb, Chem 8: cl 100, BUN 24, Cr 1.8, Ca ion 1.1; CBC: WBC 10.9 Imaging: CT showed an irregularity and focal narrowing of the left renal artery with surrounding fat stranding. Findings nonspecific but ddx include renal artery dissection or vasculitis.  BP - 193/131 after 3 doses of 20mg  labetalol 5 doses of labetalol tried with little improvement. Will start nitro drip  Patient to be admitted to hospitalist for further evaluation and care.   Patient seen by and discussed with Dr. Eulis Foster who agrees with treatment plan.    [facility] Shyah Cadmus, PA-C 03/12/15 1837  Daleen Bo, MD 03/13/15 [phone]

## 2015-03-12 NOTE — ED Provider Notes (Signed)
Face-to-face evaluation   History: Presents for evaluation of left mid to low back pain. He did not take his medication this morning. He has intermittent ongoing chronic low back pain.  Physical exam: Alert, calm, cooperative. Abdomen is soft and nontender. Normal range of motion  Back. Neuro- alert and oriented 3. Affect appropriate.  Medications  nitroGLYCERIN 50 mg in dextrose 5 % 250 mL (0.2 mg/mL) infusion (not administered)  iohexol (OMNIPAQUE) 350 MG/ML injection 100 mL (80 mLs Intravenous Contrast Given 03/12/15 1356)  labetalol (NORMODYNE,TRANDATE) injection 20 mg (20 mg Intravenous Given 03/12/15 1653)    Patient Vitals for the past 24 hrs:  BP Temp Temp src Pulse Resp SpO2 Height Weight  03/12/15 1745 (!) 186/128 mmHg - - 88 - 92 % - -  03/12/15 1730 (!) 170/116 mmHg - - 86 - 94 % - -  03/12/15 1715 (!) 181/121 mmHg - - 87 - 93 % - -  03/12/15 1700 (!) 183/124 mmHg - - 102 - 94 % - -  03/12/15 1654 (!) 182/137 mmHg - - 88 - 94 % - -  03/12/15 1645 (!) 193/131 mmHg - - 85 - 94 % - -  03/12/15 1630 (!) 197/136 mmHg - - 88 - 98 % - -  03/12/15 1615 (!) 204/142 mmHg - - 83 - 97 % - -  03/12/15 1600 (!) 209/147 mmHg - - - - - - -  03/12/15 1545 (!) 207/142 mmHg - - 92 - 94 % - -  03/12/15 1514 (!) 206/151 mmHg - - 88 - 96 % - -  03/12/15 1315 (!) 213/161 mmHg - - 98 - 94 % - -  03/12/15 1300 (!) 208/145 mmHg - - 86 - 97 % - -  03/12/15 1230 (!) 212/153 mmHg - - 89 - 96 % - -  03/12/15 1141 (!) 222/148 mmHg 98.2 F (36.8 C) Oral 61 18 95 % 5\' 11"  (1.803 m) 190 lb (86.183 kg)    6:23 PM Reevaluation with update and discussion. After initial assessment and treatment, an updated evaluation rev. He continues to have significant hypertension, mean arterial pressure has improved from 171-148, but remains markedly elevated. Nitroglycerin drip has been ordered. Dayn Barich L  6:24 PM-Consult complete with Hospitalist. Patient case explained and discussed. She agrees to admit patient  for further evaluation and treatment. Call ended at [place] Performed by: Richarda Blade Total critical care time: 50 minutes minutes Critical care time was exclusive of separately billable procedures and treating other patients. Critical care was necessary to treat or prevent imminent or life-threatening deterioration. Critical care was time spent personally by me on the following activities: development of treatment plan with patient and/or surrogate as well as nursing, discussions with consultants, evaluation of patient's response to treatment, examination of patient, obtaining history from patient or surrogate, ordering and performing treatments and interventions, ordering and review of laboratory studies, ordering and review of radiographic studies, pulse oximetry and re-evaluation of patient's condition.  Ct Angio Chest Aorta W/cm &/or Wo/cm  03/12/2015  CLINICAL DATA:  Patient with back pain. Evaluate for aortic dissection. EXAM: CT ANGIOGRAPHY CHEST, ABDOMEN AND PELVIS TECHNIQUE: Multidetector CT imaging of the chest, abdomen and pelvis was performed using the standard protocol during bolus administration of intravenous contrast. Multiplanar CT image reconstructions and MIPs were obtained to evaluate the vascular anatomy. CONTRAST:  19mL OMNIPAQUE IOHEXOL 350 MG/ML SOLN COMPARISON:  None. FINDINGS: CTA CHEST FINDINGS Mediastinum/Nodes: Noncontrast imaging through the thorax demonstrates no peripheral  high attenuation within the thoracic aorta to suggest intramural hematoma formation. Post contrast-enhanced images demonstrate motion artifact limiting evaluation of the aortic root. The thoracic aorta is opacified with contrast material. No evidence for acute thoracic aortic dissection. The takeoff of the great vessels are patent. Visualized thyroid is unremarkable. No enlarged axillary, mediastinal or hilar lymphadenopathy. Heart is normal in size. No pericardial effusion. Aorta and main  pulmonary artery normal in caliber. No evidence for central pulmonary embolism. Coronary arterial vascular calcifications. Lungs/Pleura: The central airways are patent. No consolidative pulmonary opacities. No pleural effusion or pneumothorax. Review of the MIP images confirms the above findings. CTA ABDOMEN FINDINGS Hepatobiliary: Liver is normal in size and contour. No focal hepatic lesion is identified. The gallbladder is unremarkable. No intrahepatic or extrahepatic biliary ductal dilatation. Pancreas: Unremarkable Spleen: Unremarkable Adrenals/Urinary Tract: The adrenal glands are normal. The kidneys enhance symmetrically with contrast. Multiple bilateral low-attenuation subcentimeter renal lesions are demonstrated, too small to accurately characterize. There is circumferential wall thickening of the urinary bladder. Stomach/Bowel: No abnormal bowel wall thickening or evidence for bowel obstruction. No free fluid or free intraperitoneal air. No evidence for bowel obstruction. Vascular/Lymphatic: The abdominal aorta is patent and normal in caliber. There is fat stranding about the left renal artery with apparent focal areas of narrowing, demonstrated best on sagittal images (image 103; series 503). The branch vessels including the celiac, superior mesenteric and inferior mesenteric arteries are patent. The common iliac vessels as well as the internal and external iliac vessels are patent. Other: Prostate enlarged. Musculoskeletal: No aggressive or acute appearing osseous lesions. Review of the MIP images confirms the above findings. IMPRESSION: There is irregularity and focal narrowing of the left renal artery with with surrounding fat stranding. There is some motion artifact limiting evaluation of this location. Overall findings are nonspecific however the top differential considerations would be focal left renal artery dissection or vasculitis. Additional but less likely consideration would be left renal  artery infection in the setting of pyelonephritis. On single phase contrast-enhanced exam, the left kidney appears perfused. Critical Value/emergent results were called by telephone at the time of interpretation on 03/12/2015 at 3:28 pm to Dr. Eulis Foster, who verbally acknowledged these results. Electronically Signed   By: Lovey Newcomer M.D.   On: 03/12/2015 15:30   Ct Angio Abd/pel W/ And/or W/o  03/12/2015  CLINICAL DATA:  Patient with back pain. Evaluate for aortic dissection. EXAM: CT ANGIOGRAPHY CHEST, ABDOMEN AND PELVIS TECHNIQUE: Multidetector CT imaging of the chest, abdomen and pelvis was performed using the standard protocol during bolus administration of intravenous contrast. Multiplanar CT image reconstructions and MIPs were obtained to evaluate the vascular anatomy. CONTRAST:  57mL OMNIPAQUE IOHEXOL 350 MG/ML SOLN COMPARISON:  None. FINDINGS: CTA CHEST FINDINGS Mediastinum/Nodes: Noncontrast imaging through the thorax demonstrates no peripheral high attenuation within the thoracic aorta to suggest intramural hematoma formation. Post contrast-enhanced images demonstrate motion artifact limiting evaluation of the aortic root. The thoracic aorta is opacified with contrast material. No evidence for acute thoracic aortic dissection. The takeoff of the great vessels are patent. Visualized thyroid is unremarkable. No enlarged axillary, mediastinal or hilar lymphadenopathy. Heart is normal in size. No pericardial effusion. Aorta and main pulmonary artery normal in caliber. No evidence for central pulmonary embolism. Coronary arterial vascular calcifications. Lungs/Pleura: The central airways are patent. No consolidative pulmonary opacities. No pleural effusion or pneumothorax. Review of the MIP images confirms the above findings. CTA ABDOMEN FINDINGS Hepatobiliary: Liver is normal in size and contour. No focal  hepatic lesion is identified. The gallbladder is unremarkable. No intrahepatic or extrahepatic biliary  ductal dilatation. Pancreas: Unremarkable Spleen: Unremarkable Adrenals/Urinary Tract: The adrenal glands are normal. The kidneys enhance symmetrically with contrast. Multiple bilateral low-attenuation subcentimeter renal lesions are demonstrated, too small to accurately characterize. There is circumferential wall thickening of the urinary bladder. Stomach/Bowel: No abnormal bowel wall thickening or evidence for bowel obstruction. No free fluid or free intraperitoneal air. No evidence for bowel obstruction. Vascular/Lymphatic: The abdominal aorta is patent and normal in caliber. There is fat stranding about the left renal artery with apparent focal areas of narrowing, demonstrated best on sagittal images (image 103; series 503). The branch vessels including the celiac, superior mesenteric and inferior mesenteric arteries are patent. The common iliac vessels as well as the internal and external iliac vessels are patent. Other: Prostate enlarged. Musculoskeletal: No aggressive or acute appearing osseous lesions. Review of the MIP images confirms the above findings. IMPRESSION: There is irregularity and focal narrowing of the left renal artery with with surrounding fat stranding. There is some motion artifact limiting evaluation of this location. Overall findings are nonspecific however the top differential considerations would be focal left renal artery dissection or vasculitis. Additional but less likely consideration would be left renal artery infection in the setting of pyelonephritis. On single phase contrast-enhanced exam, the left kidney appears perfused. Critical Value/emergent results were called by telephone at the time of interpretation on 03/12/2015 at 3:28 pm to Dr. Eulis Foster, who verbally acknowledged these results. Electronically Signed   By: Lovey Newcomer M.D.   On: 03/12/2015 15:30   Medical decision-making- marked blood pressure elevation secondary to medication noncompliance. Back pain is nonspecific but  may be related to a renal artery abnormality on the left side. The CT findings were nondiagnostic. He may require evaluation and further workup by vascular surgery. He has mild renal insufficiency. There no baseline labs for comparison.  Diagnoses that have been ruled out:  None  Diagnoses that are still under consideration:  None  Final diagnoses:  Back pain  Hypertensive urgency  Vasculitis [facility])  Renal insufficiency     Medical screening examination/treatment/procedure(s) were conducted as a shared visit with non-physician practitioner(s) and myself.  I personally evaluated the patient during the encounter  Daleen Bo, MD 03/12/15 [phone]

## 2015-03-12 NOTE — ED Notes (Signed)
Patient states "I need to have my back checked to make sure it's not my kidneys".  Patient denies urinary symptoms or other issues, but bp is 222/148.   Patient states he has not taken his htn medications today.

## 2015-03-12 NOTE — ED Notes (Signed)
Pt screened for [facility]. "I want to make sure my back pain is not coming from my kidneys.Marland KitchenMarland KitchenI have kidney disease".

## 2015-03-12 NOTE — ED Notes (Signed)
Meal tray ordered 

## 2015-03-13 ENCOUNTER — Inpatient Hospital Stay (HOSPITAL_COMMUNITY): Payer: Non-veteran care

## 2015-03-13 DIAGNOSIS — R079 Chest pain, unspecified: Secondary | ICD-10-CM

## 2015-03-13 LAB — TROPONIN I
Troponin I: <0.03 ng/mL (ref <0.031)
Troponin I: <0.03 ng/mL (ref <0.031)

## 2015-03-13 LAB — PROTEIN / CREATININE RATIO, URINE
Creatinine, Urine: 122.62 mg/dL
PROTEIN CREATININE RATIO: 0.93 mg/mg{creat} — AB (ref 0.00–0.15)
Total Protein, Urine: 114 mg/dL

## 2015-03-13 LAB — COMPREHENSIVE METABOLIC PANEL
ALBUMIN: 3.3 g/dL — AB (ref 3.5–5.0)
ALK PHOS: 47 U/L (ref 38–126)
ALT: 14 U/L — ABNORMAL LOW (ref 17–63)
AST: 17 U/L (ref 15–41)
Anion gap: 8 (ref 5–15)
BUN: 17 mg/dL (ref 6–20)
CALCIUM: 8.9 mg/dL (ref 8.9–10.3)
CO2: 28 mmol/L (ref 22–32)
CREATININE: 1.91 mg/dL — AB (ref 0.61–1.24)
Chloride: 100 mmol/L — ABNORMAL LOW (ref 101–111)
GFR calc Af Amer: 47 mL/min — ABNORMAL LOW (ref >60)
GFR calc non Af Amer: 40 mL/min — ABNORMAL LOW (ref >60)
GLUCOSE: 105 mg/dL — AB (ref 65–99)
Potassium: 3.2 mmol/L — ABNORMAL LOW (ref 3.5–5.1)
SODIUM: 136 mmol/L (ref 135–145)
Total Bilirubin: 0.7 mg/dL (ref 0.3–1.2)
Total Protein: 6.4 g/dL — ABNORMAL LOW (ref 6.5–8.1)

## 2015-03-13 LAB — CBC
HCT: 38.5 % — ABNORMAL LOW (ref 39.0–52.0)
HEMOGLOBIN: 13.1 g/dL (ref 13.0–17.0)
MCH: 29.2 pg (ref 26.0–34.0)
MCHC: 34 g/dL (ref 30.0–36.0)
MCV: 85.7 fL (ref 78.0–100.0)
Platelets: 239 10*3/uL (ref 150–400)
RBC: 4.49 MIL/uL (ref 4.22–5.81)
RDW: 12.3 % (ref 11.5–15.5)
WBC: 14.5 10*3/uL — ABNORMAL HIGH (ref 4.0–10.5)

## 2015-03-13 LAB — HEPARIN LEVEL (UNFRACTIONATED)
HEPARIN UNFRACTIONATED: 0.13 [IU]/mL — AB (ref 0.30–0.70)
Heparin Unfractionated: 0.36 IU/mL (ref 0.30–0.70)

## 2015-03-13 LAB — LIPID PANEL
CHOLESTEROL: 163 mg/dL (ref 0–200)
HDL: 32 mg/dL — ABNORMAL LOW (ref >40)
LDL Cholesterol: 66 mg/dL (ref 0–99)
TRIGLYCERIDES: 325 mg/dL — AB (ref <150)
Total CHOL/HDL Ratio: 5.1 RATIO
VLDL: 65 mg/dL — AB (ref 0–40)

## 2015-03-13 LAB — MRSA PCR SCREENING: MRSA by PCR: NEGATIVE

## 2015-03-13 MED ORDER — POTASSIUM CHLORIDE IN NACL 40-0.9 MEQ/L-% IV SOLN
INTRAVENOUS | Status: DC
  Administered 2015-03-13 (×2): 75 mL/h via INTRAVENOUS
  Filled 2015-03-13 (×3): qty 1000

## 2015-03-13 MED ORDER — ONDANSETRON HCL 4 MG/2ML IJ SOLN
4.0000 mg | Freq: Four times a day (QID) | INTRAMUSCULAR | Status: DC | PRN
  Administered 2015-03-13: 4 mg via INTRAVENOUS

## 2015-03-13 MED ORDER — LABETALOL HCL 200 MG PO TABS
200.0000 mg | ORAL_TABLET | Freq: Two times a day (BID) | ORAL | Status: DC
  Administered 2015-03-13 – 2015-03-14 (×3): 200 mg via ORAL
  Filled 2015-03-13 (×3): qty 1

## 2015-03-13 MED ORDER — ACETAMINOPHEN 325 MG PO TABS
650.0000 mg | ORAL_TABLET | ORAL | Status: DC | PRN
  Administered 2015-03-13 – 2015-03-17 (×4): 650 mg via ORAL
  Filled 2015-03-13 (×4): qty 2

## 2015-03-13 MED ORDER — HEPARIN BOLUS VIA INFUSION
2000.0000 [IU] | Freq: Once | INTRAVENOUS | Status: AC
  Administered 2015-03-13: 2000 [IU] via INTRAVENOUS
  Filled 2015-03-13: qty 2000

## 2015-03-13 NOTE — Progress Notes (Signed)
ANTICOAGULATION CONSULT NOTE - Follow up [place] for Heparin Indication: Renal artery dissection  Allergies  Allergen Reactions  . Other Other (See Comments)    G6 pill allergy: pill given before travel in [place]    Patient Measurements: Height: 5\' 11"  (180.3 cm) Weight: 187 lb 13.3 oz (85.2 kg) IBW/kg (Calculated) : 75.3   Vital Signs: Temp: 97.7 F (36.5 C) (11/09 0400) Temp Source: Oral (11/09 0400) BP: 158/104 mmHg (11/09 0700) Pulse Rate: 95 (11/09 0700)  Labs:  Recent Labs  03/12/15 1335 03/12/15 1345 03/12/15 2115 03/13/15 0049 03/13/15 0710 03/13/15 0810  HGB 15.5 17.0 13.6  --  13.1  --   HCT 44.9 50.0 39.9  --  38.5*  --   PLT 276  --  242  --  239  --   HEPARINUNFRC  --   --   --  0.13*  --  0.36  CREATININE  --  1.80* 2.15* 1.91*  --   --   TROPONINI  --   --  <0.03 <0.03 <0.03  --     Estimated Creatinine Clearance: 51.5 mL/min (by C-G formula based on Cr of 1.91).   Medical History: Past Medical History  Diagnosis Date  . Hypertension   . Renal disorder   . Asthma   . Renal insufficiency   . Sickle cell anemia (HCC)     Has the trait-per patient (03/12/15)    Assessment: 44 year old male with a history of chronic kidney disease stage II  hypertension, asthma,  presented to the ER with left flank pain.  Possible renal artery dissection - to get repeat CTA. HL therapeutic at 0.36 drawn 5 hrs and 20 minutes after 2000 unit bolus and rate increase to 1300 units/hr.  Drawn early and could have some bolus effect. CBC stable. RN reports no s/s of bleeding   Goal of Therapy:  Heparin level 0.3-0.7 units/ml Monitor platelets by anticoagulation protocol: Yes   Plan:  -Increase heparin drip to 1350 units / hr  -Heparin level and CBC daily  Eudelia Bunch, Pharm.D. [id number] 03/13/2015 10:18 AM

## 2015-03-13 NOTE — Consult Note (Signed)
Nephrology Consult Note  Patient name: Dylan Sweeney Medical record number: [id number] Date of birth: 09/06/1970 Age: 44 y.o. Gender: male  Primary Care Provider: No primary care provider on file.  Pt Overview and Major Events to Date:  11/8: Pt. 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 Blood pressure difficult to control, but coming down with Nitroglycerin drip. Evaluated by Vascular Surgery and placed on Heparin with plan for repeat imaging.  11/9: Pt. With ongoing elevated BP's, but better than admission.   Pt. Is a 44 y/o M with hx of HTN (on 4 BP meds at home), Asthma, CKD II (followed by nephrology in [facility] up to this point), BPH?. Presents with left sided back pain and malignant hypertension. 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 He says he had some back pain on Monday that improved with narcotic medication given to him by a family member. He says that he had a headache, with ongoing back pain, so he came in to the ED. No nausea, vomiting, fever, chills. No dysuria, hematuria, or urinary frequency. He has some mild abdominal pain. He has no chest pain. He has not had any numbness, or weakness. No acute changes in vision on admission, but did have some with his headache when the nitroglycerin drip was started. Does not smoke, does drink 2-3 beers / day. Strong family Hx of HTN with all of his siblings having hypertension requiring multiple medications. No hx of polycystic kidney disease, autoimmune disease, or renal artery stenosis in the past. No hx of any abdominal surgery.  Blood pressure has been reasonably controlled on nitro drip and hydralazine not too low.  He tells me his baseline creatinine has been 1.7 since his "30's"  Assessment and Plan: Essentially 44 y/o [place] here with Malignant HTN and possible renal artery dissection along with acute on chronic kidney disease.    Malignant HTN - Pt. With poorly controlled htn at baseline. On four medications at home. Claims compliance, says he is followed by [facility] nephrology provider in [facility] for this. Presents here with BP 200's / 150's and evidence of possible renal artery dissection. BP brought down with prn labetalol and nitro drip. Now 160's / 105.  - Nitro drip for now until able to get his blood pressure lower with PO meds.  - Start Labetalol 200mg  BID po.  - Titrate nitro drip to goal of 140-160 / 90 - 100.  -  Continue Hydralazine.  - Hold off on Aldactone for now, but likely will eventually resume.  - Holding Cozaar.  - Trend BMET.   AKI on CKD II - Pt. States that his baseline creatinine is 1.7. Cr - 1.8 on admission then up to 2 and now 1.9. He received Contrast for CTA. He says he follows with a nephrologist from the [place] in [facility]. Getting gentle fluids. Likely combination of HTN, and IV contrast.  - Continue NS, KCL at 75cc/hr for now.  - Avoid dropping BP too low. Goals as above.  - Avoid nephrotoxic agents. Holding ARB. No NSAID's for pain (pt. Says he was taking alleve at home).  - If improving Cr tomorrow, could likely tolerate second CTA.  - Trend daily BMET.   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  - On Heparin - repeat scan tomorrow if Cr better.  - Will follow along.  Asthma - no exacerbation. Mgmt per primary team.   BPH? -pt. Young for this, but does endorse urinary difficulty relieved by Flomax.   Disposition: Pending improvement of blood pressure and further evaluation for renal artery dissection.   Objective: Temp:  [97.7 F (36.5 C)-98.8 F (37.1 C)] 98.8 F (37.1 C) (11/09 1200) Pulse Rate:  [61-102] 102 (11/09 1200) Resp:  [14-21] 21 (11/09 0922) BP: (145-209)/(96-151) 169/108 mmHg (11/09 1345) SpO2:  [91 %-98 %] 91 % (11/09 1200) Weight:  [187 lb 13.3 oz (85.2 kg)] 187 lb 13.3 oz (85.2 kg) (11/08  2100) Physical Exam: General: NAD, AAOx3, resting in bed.  Cardiovascular: RRR, no MGR, normal S1/S2, 2+ distal pulses.  Respiratory: CTA Bilaterally, appropriate rate, unlabored.  Abdomen: S, NT, ND, No abdominal bruits audible. +BS.  Extremities: WWP, 2+ distal pulses, no edema, MAEW.   Laboratory:  Recent Labs Lab 03/12/15 1335 03/12/15 1345 03/12/15 2115 03/13/15 0710  WBC 10.9*  --  13.1* 14.5*  HGB 15.5 17.0 13.6 13.1  HCT 44.9 50.0 39.9 38.5*  PLT 276  --  242 239    Recent Labs Lab 03/12/15 1345 03/12/15 2115 03/13/15 0049  NA 138  --  136  K 3.7  --  3.2*  CL 100*  --  100*  CO2  --   --  28  BUN 24*  --  17  CREATININE 1.80* 2.15* 1.91*  CALCIUM  --   --  8.9  PROT  --  7.0 6.4*  BILITOT  --  0.7 0.7  ALKPHOS  --  48 47  ALT  --  15* 14*  AST  --  18 17  GLUCOSE 95  --  105*    Echo pending.   Imaging/Diagnostic Tests: IMPRESSION: There is irregularity and focal narrowing of the left renal artery with with surrounding fat stranding. There is some motion artifact limiting evaluation of this location. Overall findings are nonspecific however the top differential considerations would be focal left renal artery dissection or vasculitis. Additional but less likely consideration would be left renal artery infection in the setting of pyelonephritis. On single phase contrast-enhanced exam, the left kidney appears perfused.  Critical Value/emergent results were called by telephone at the time of interpretation on 03/12/2015 at 3:28 pm to Dr. Eulis Foster, who verbally acknowledged these results.   Electronically Signed  By: Lovey Newcomer M.D.  On: 03/12/2015 15:30  Aquilla Hacker, MD 03/13/2015, 2:36 PM PGY-2  Patient seen and examined, agree with above note with above modifications. 44 year old who says he has had difficult to control HTN and CKD since his 50's with baseline CKD creatinine of 1.7.  He presents with flank pain- found to have  abnormality of his left renal artery ? Dissection.  Vascualr on board, for re imaging at some point.  Regarding BP is reasonably well controlled at this time- do not want to drop too low.  Will add labetalol today and likely aldactone tomorrow and wean nitro drip but using nitro drip right now in the short term to keep BP from getting too high again  Corliss Parish, MD 03/14/2015

## 2015-03-13 NOTE — Progress Notes (Signed)
The patients Bp was trending down over the last 3 hours.  Called MD Abrol and was asked to D/C nitro drip.  Drip discontinued and charted. Will continue to monitor.

## 2015-03-13 NOTE — Progress Notes (Signed)
Referring Physician(s): CCM  Chief Complaint:  Left renal artery dissection Malignant hypertension  Subjective:  Pt feels weak Tired H/H 13/38.5 today Complains of abd pain today   Allergies: Other  Medications: Prior to Admission medications   Medication Sig Start Date End Date Taking? Authorizing Provider  albuterol (PROVENTIL HFA;VENTOLIN HFA) 108 (90 BASE) MCG/ACT inhaler Inhale into the lungs every 6 (six) hours as needed for wheezing or shortness of breath.   Yes Historical Provider, MD  cholecalciferol (VITAMIN D) 1000 UNITS tablet Take 1,000 Units by mouth daily.   Yes Historical Provider, MD  Fluticasone-Salmeterol (ADVAIR) 250-50 MCG/DOSE AEPB Inhale 1 puff into the lungs 2 (two) times daily.   Yes Historical Provider, MD  hydrALAZINE (APRESOLINE) 25 MG tablet Take 25 mg by mouth 2 (two) times daily.   Yes Historical Provider, MD  hydrochlorothiazide (HYDRODIURIL) 25 MG tablet Take 25 mg by mouth daily.   Yes Historical Provider, MD  losartan (COZAAR) 50 MG tablet Take 50 mg by mouth daily.   Yes Historical Provider, MD  omega-3 acid ethyl esters (LOVAZA) 1 G capsule Take by mouth 2 (two) times daily.   Yes Historical Provider, MD  spironolactone (ALDACTONE) 50 MG tablet Take 50 mg by mouth daily.   Yes Historical Provider, MD  tamsulosin (FLOMAX) 0.4 MG CAPS capsule Take 0.4 mg by mouth.   Yes Historical Provider, MD     Vital Signs: BP 162/113 mmHg  Pulse 102  Temp(Src) 98.8 F (37.1 C) (Oral)  Resp 21  Ht 5\' 11"  (1.803 m)  Wt 187 lb 13.3 oz (85.2 kg)  BMI 26.21 kg/m2  SpO2 91%  Physical Exam  Constitutional: He is oriented to person, place, and time.  Musculoskeletal: Normal range of motion.  Neurological: He is oriented to person, place, and time.  Skin: Skin is warm and dry.  Psychiatric: He has a normal mood and affect. His behavior is normal. Judgment and thought content normal.  Nursing note and vitals reviewed. Resting  comfortably  Imaging: Ct Angio Chest Aorta W/cm &/or Wo/cm  03/12/2015  CLINICAL DATA:  Patient with back pain. Evaluate for aortic dissection. EXAM: CT ANGIOGRAPHY CHEST, ABDOMEN AND PELVIS TECHNIQUE: Multidetector CT imaging of the chest, abdomen and pelvis was performed using the standard protocol during bolus administration of intravenous contrast. Multiplanar CT image reconstructions and MIPs were obtained to evaluate the vascular anatomy. CONTRAST:  31mL OMNIPAQUE IOHEXOL 350 MG/ML SOLN COMPARISON:  None. FINDINGS: CTA CHEST FINDINGS Mediastinum/Nodes: Noncontrast imaging through the thorax demonstrates no peripheral high attenuation within the thoracic aorta to suggest intramural hematoma formation. Post contrast-enhanced images demonstrate motion artifact limiting evaluation of the aortic root. The thoracic aorta is opacified with contrast material. No evidence for acute thoracic aortic dissection. The takeoff of the great vessels are patent. Visualized thyroid is unremarkable. No enlarged axillary, mediastinal or hilar lymphadenopathy. Heart is normal in size. No pericardial effusion. Aorta and main pulmonary artery normal in caliber. No evidence for central pulmonary embolism. Coronary arterial vascular calcifications. Lungs/Pleura: The central airways are patent. No consolidative pulmonary opacities. No pleural effusion or pneumothorax. Review of the MIP images confirms the above findings. CTA ABDOMEN FINDINGS Hepatobiliary: Liver is normal in size and contour. No focal hepatic lesion is identified. The gallbladder is unremarkable. No intrahepatic or extrahepatic biliary ductal dilatation. Pancreas: Unremarkable Spleen: Unremarkable Adrenals/Urinary Tract: The adrenal glands are normal. The kidneys enhance symmetrically with contrast. Multiple bilateral low-attenuation subcentimeter renal lesions are demonstrated, too small to accurately characterize. There  is circumferential wall thickening of the  urinary bladder. Stomach/Bowel: No abnormal bowel wall thickening or evidence for bowel obstruction. No free fluid or free intraperitoneal air. No evidence for bowel obstruction. Vascular/Lymphatic: The abdominal aorta is patent and normal in caliber. There is fat stranding about the left renal artery with apparent focal areas of narrowing, demonstrated best on sagittal images (image 103; series 503). The branch vessels including the celiac, superior mesenteric and inferior mesenteric arteries are patent. The common iliac vessels as well as the internal and external iliac vessels are patent. Other: Prostate enlarged. Musculoskeletal: No aggressive or acute appearing osseous lesions. Review of the MIP images confirms the above findings. IMPRESSION: There is irregularity and focal narrowing of the left renal artery with with surrounding fat stranding. There is some motion artifact limiting evaluation of this location. Overall findings are nonspecific however the top differential considerations would be focal left renal artery dissection or vasculitis. Additional but less likely consideration would be left renal artery infection in the setting of pyelonephritis. On single phase contrast-enhanced exam, the left kidney appears perfused. Critical Value/emergent results were called by telephone at the time of interpretation on 03/12/2015 at 3:28 pm to Dr. Eulis Foster, who verbally acknowledged these results. Electronically Signed   By: Lovey Newcomer M.D.   On: 03/12/2015 15:30   Ct Angio Abd/pel W/ And/or W/o  03/12/2015  CLINICAL DATA:  Patient with back pain. Evaluate for aortic dissection. EXAM: CT ANGIOGRAPHY CHEST, ABDOMEN AND PELVIS TECHNIQUE: Multidetector CT imaging of the chest, abdomen and pelvis was performed using the standard protocol during bolus administration of intravenous contrast. Multiplanar CT image reconstructions and MIPs were obtained to evaluate the vascular anatomy. CONTRAST:  59mL OMNIPAQUE IOHEXOL 350  MG/ML SOLN COMPARISON:  None. FINDINGS: CTA CHEST FINDINGS Mediastinum/Nodes: Noncontrast imaging through the thorax demonstrates no peripheral high attenuation within the thoracic aorta to suggest intramural hematoma formation. Post contrast-enhanced images demonstrate motion artifact limiting evaluation of the aortic root. The thoracic aorta is opacified with contrast material. No evidence for acute thoracic aortic dissection. The takeoff of the great vessels are patent. Visualized thyroid is unremarkable. No enlarged axillary, mediastinal or hilar lymphadenopathy. Heart is normal in size. No pericardial effusion. Aorta and main pulmonary artery normal in caliber. No evidence for central pulmonary embolism. Coronary arterial vascular calcifications. Lungs/Pleura: The central airways are patent. No consolidative pulmonary opacities. No pleural effusion or pneumothorax. Review of the MIP images confirms the above findings. CTA ABDOMEN FINDINGS Hepatobiliary: Liver is normal in size and contour. No focal hepatic lesion is identified. The gallbladder is unremarkable. No intrahepatic or extrahepatic biliary ductal dilatation. Pancreas: Unremarkable Spleen: Unremarkable Adrenals/Urinary Tract: The adrenal glands are normal. The kidneys enhance symmetrically with contrast. Multiple bilateral low-attenuation subcentimeter renal lesions are demonstrated, too small to accurately characterize. There is circumferential wall thickening of the urinary bladder. Stomach/Bowel: No abnormal bowel wall thickening or evidence for bowel obstruction. No free fluid or free intraperitoneal air. No evidence for bowel obstruction. Vascular/Lymphatic: The abdominal aorta is patent and normal in caliber. There is fat stranding about the left renal artery with apparent focal areas of narrowing, demonstrated best on sagittal images (image 103; series 503). The branch vessels including the celiac, superior mesenteric and inferior mesenteric  arteries are patent. The common iliac vessels as well as the internal and external iliac vessels are patent. Other: Prostate enlarged. Musculoskeletal: No aggressive or acute appearing osseous lesions. Review of the MIP images confirms the above findings. IMPRESSION: There is irregularity and  focal narrowing of the left renal artery with with surrounding fat stranding. There is some motion artifact limiting evaluation of this location. Overall findings are nonspecific however the top differential considerations would be focal left renal artery dissection or vasculitis. Additional but less likely consideration would be left renal artery infection in the setting of pyelonephritis. On single phase contrast-enhanced exam, the left kidney appears perfused. Critical Value/emergent results were called by telephone at the time of interpretation on 03/12/2015 at 3:28 pm to Dr. Eulis Foster, who verbally acknowledged these results. Electronically Signed   By: Lovey Newcomer M.D.   On: 03/12/2015 15:30    Labs:  CBC:  Recent Labs  03/12/15 1335 03/12/15 1345 03/12/15 2115 03/13/15 0710  WBC 10.9*  --  13.1* 14.5*  HGB 15.5 17.0 13.6 13.1  HCT 44.9 50.0 39.9 38.5*  PLT 276  --  242 239    COAGS: No results for input(s): INR, APTT in the last 8760 hours.  BMP:  Recent Labs  03/12/15 1345 03/12/15 2115 03/13/15 0049  NA 138  --  136  K 3.7  --  3.2*  CL 100*  --  100*  CO2  --   --  28  GLUCOSE 95  --  105*  BUN 24*  --  17  CALCIUM  --   --  8.9  CREATININE 1.80* 2.15* 1.91*  GFRNONAA  --  35* 40*  GFRAA  --  41* 47*    LIVER FUNCTION TESTS:  Recent Labs  03/12/15 2115 03/13/15 0049  BILITOT 0.7 0.7  AST 18 17  ALT 15* 14*  ALKPHOS 48 47  PROT 7.0 6.4*  ALBUMIN 3.4* 3.3*    Assessment and Plan:  Malignant HTN L renal dissection Call us if need IR  Signed: Philamena Kramar A 03/13/2015, 1:15 PM   I spent a total of 15 Minutes at the the patient's bedside AND on the patient's  hospital floor or unit, greater than 50% of which was counseling/coordinating care for renal artery dissection

## 2015-03-13 NOTE — Consult Note (Signed)
[facility] NOTE   MRN : [id number]  Reason for Consult: left flank pain  Referring Physician: Reyne Dumas, MD  History of Present Illness: 44 y/o male admitted from the ED with hypertension urgency and complaints of left flank pain.  He states his pain was primarily in the back on the left and he has a history of back pain.  He states on Monday it was a 10/10 and he took his wife's hydrocodone and the pain got better.  He states the pain is now a 2/10.  Past medical history includes: hypertension, patient on HCTZ, Cozaar, Aldactone, hydralazine at home.       Current Facility-Administered Medications  Medication Dose Route Frequency Provider Last Rate Last Dose  . 0.9 % NaCl with KCl 40 mEq / L  infusion   Intravenous Continuous Reyne Dumas, MD 75 mL/hr at 03/13/15 0825 75 mL/hr at 03/13/15 0825  . acetaminophen (TYLENOL) tablet 650 mg  650 mg Oral Q4H PRN Rhetta Mura Schorr, NP   650 mg at 03/13/15 0151  . albuterol (PROVENTIL) (2.5 MG/3ML) 0.083% nebulizer solution 3 mL  3 mL Inhalation Q6H PRN Reyne Dumas, MD      . folic acid (FOLVITE) tablet 1 mg  1 mg Oral Daily Reyne Dumas, MD   1 mg at 03/12/15 2127  . heparin ADULT infusion 100 units/mL (25000 units/250 mL)  1,300 Units/hr Intravenous Continuous Darnell Level Mancheril, RPH 13 mL/hr at 03/13/15 0248 1,300 Units/hr at 03/13/15 0248  . hydrALAZINE (APRESOLINE) tablet 100 mg  100 mg Oral 3 times per day Reyne Dumas, MD   100 mg at 03/13/15 0513  . HYDROmorphone (DILAUDID) injection 0.5 mg  0.5 mg Intravenous Q3H PRN Reyne Dumas, MD   0.5 mg at 03/13/15 0711  . labetalol (NORMODYNE,TRANDATE) injection 5 mg  5 mg Intravenous Q4H PRN Reyne Dumas, MD      . LORazepam (ATIVAN) tablet 1 mg  1 mg Oral Q6H PRN Reyne Dumas, MD       Or  . LORazepam (ATIVAN) injection 1 mg  1 mg Intravenous Q6H PRN Reyne Dumas, MD      . mometasone-formoterol (DULERA) 100-5 MCG/ACT inhaler 2 puff  2 puff Inhalation  BID Reyne Dumas, MD   2 puff at 03/13/15 0808  . multivitamin with minerals tablet 1 tablet  1 tablet Oral Daily Reyne Dumas, MD   1 tablet at 03/12/15 2127  . nitroGLYCERIN 50 mg in dextrose 5 % 250 mL (0.2 mg/mL) infusion  5 mcg/min Intravenous Titrated [facility] Ward, PA-C 9 mL/hr at 03/13/15 0510 30 mcg/min at 03/13/15 0510  . omega-3 acid ethyl esters (LOVAZA) capsule 1 g  1 g Oral BID Reyne Dumas, MD   1 g at 03/12/15 2206  . ondansetron (ZOFRAN) tablet 4 mg  4 mg Oral Q6H PRN Reyne Dumas, MD       Or  . ondansetron (ZOFRAN) injection 4 mg  4 mg Intravenous Q6H PRN Reyne Dumas, MD      . ondansetron (ZOFRAN) injection 4 mg  4 mg Intravenous Q6H PRN Reyne Dumas, MD      . sodium chloride 0.9 % injection 3 mL  3 mL Intravenous Q12H Reyne Dumas, MD   3 mL at 03/12/15 2200  . tamsulosin (FLOMAX) capsule 0.4 mg  0.4 mg Oral QPC breakfast Reyne Dumas, MD   0.4 mg at 03/13/15 0837  . thiamine (VITAMIN B-1) tablet 100 mg  100 mg Oral Daily  Reyne Dumas, MD   100 mg at 03/12/15 2127   Or  . thiamine (B-1) injection 100 mg  100 mg Intravenous Daily Reyne Dumas, MD        Pt meds include: Statin :No Betablocker: No ASA: No Other anticoagulants/antiplatelets:   Past Medical History  Diagnosis Date  . Hypertension   . Renal disorder   . Asthma   . Renal insufficiency   . Sickle cell anemia (HCC)     Has the trait-per patient (03/12/15)    History reviewed. No pertinent past surgical history.  Social History Social History  Substance Use Topics  . Smoking status: Never Smoker   . Smokeless tobacco: None  . Alcohol Use: Yes     Comment: 6 pack/week    Family History All 5 siblings have hypertension and mother has hypertension and sickle cell   Allergies  Allergen Reactions  . Other Other (See Comments)    G6 pill allergy: pill given before travel in [place]: [ ]  Weight loss, [ ]  Fever, [ ]  chills Neurologic: [ ]  Dizziness, [ ]   Blackouts, [ ]  Seizure [ ]  Stroke, [ ]  "Mini stroke", [ ]  Slurred speech, [ ]  Temporary blindness; [ ]  weakness in arms or legs, [ ]  Hoarseness [ ]  Dysphagia Cardiac: [ ]  Chest pain/pressure, [ ]  Shortness of breath at rest [ ]  Shortness of breath with exertion, [ ]  Atrial fibrillation or irregular heartbeat  Vascular: [ ]  Pain in legs with walking, [ ]  Pain in legs at rest, [ ]  Pain in legs at night,  [ ]  Non-healing ulcer, [ ]  Blood clot in vein/DVT,   Pulmonary: [ ]  Home oxygen, [ ]  Productive cough, [ ]  Coughing up blood, [ ]  Asthma,  [ ]  Wheezing [ ]  COPD Musculoskeletal:  [ ]  Arthritis, [ ]  Low back pain, [ ]  Joint pain [x]  back pain Hematologic: [ ]  Easy Bruising, [ ]  Anemia; [ ]  Hepatitis Gastrointestinal: [ ]  Blood in stool, [ ]  Gastroesophageal Reflux/heartburn, Urinary: [x ] chronic Kidney disease, [ ]  on HD - [ ]  MWF or [ ]  TTHS, [ ]  Burning with urination, [ ]  Difficulty urinating Skin: [ ]  Rashes, [ ]  Wounds Psychological: [ ]  Anxiety, [ ]  Depression  Physical Examination Filed Vitals:   03/13/15 0513 03/13/15 0600 03/13/15 0700 03/13/15 0808  BP: 176/109 168/107 158/104   Pulse:  61 95   Temp:      TempSrc:      Resp:  19 16   Height:      Weight:      SpO2:  95% 97% 97%   Body mass index is 26.21 kg/(m^2).  General:  WDWN in NAD HENT: WNL Eyes: Pupils equal  Pulmonary: normal non-labored breathing , without Rales, rhonchi,  wheezing Cardiac: RRR, without  Murmurs, rubs or gallops; No carotid bruits Abdomen: soft, NT, no masses Skin: no rashes, ulcers noted;  no Gangrene , no cellulitis; no open wounds;   Vascular Exam/Pulses:Palpable radial, femoral, PT/DP 2+   Musculoskeletal: no muscle wasting or atrophy; no edema  Neurologic: A&O X 3; Appropriate Affect ;  SENSATION: normal; MOTOR FUNCTION: 5/5 Symmetric Speech is fluent/normal   Significant Diagnostic Studies: CBC Lab Results  Component Value Date   WBC 14.5* 03/13/2015   HGB 13.1 03/13/2015    HCT 38.5* 03/13/2015   MCV 85.7 03/13/2015   PLT 239 03/13/2015    BMET    Component Value  Date/Time   NA 136 03/13/2015 0049   K 3.2* 03/13/2015 0049   CL 100* 03/13/2015 0049   CO2 28 03/13/2015 0049   GLUCOSE 105* 03/13/2015 0049   BUN 17 03/13/2015 0049   CREATININE 1.91* 03/13/2015 0049   CALCIUM 8.9 03/13/2015 0049   GFRNONAA 40* 03/13/2015 0049   GFRAA 47* 03/13/2015 0049   Estimated Creatinine Clearance: 51.5 mL/min (by C-G formula based on Cr of 1.91).  COAG No results found for: INR, PROTIME   Non-Invasive Vascular Imaging:  CTA 03/12/2015 IMPRESSION: There is irregularity and focal narrowing of the left renal artery with with surrounding fat stranding. There is some motion artifact limiting evaluation of this location. Overall findings are nonspecific however the top differential considerations would be focal left renal artery dissection or vasculitis. Additional but less likely consideration would be left renal artery infection in the setting of pyelonephritis. On single phase contrast-enhanced exam, the left kidney appears perfused.  ASSESSMENT/PLAN:  Hypertensive urgency R/O left renal artery dissection We will repeat the CTA with caution to the contrast dye in regards to his CKD We recommend anticoagulation and stick BP control   COLLINS, EMMA MAUREEN 03/13/2015 8:57 AM  I agree with the above.  I have seen and examined the patient.  He presented with a hypertensive urgency and left flank pain.  CTA suggests s possible left renal dissection.  The timing of the contrast was not ideal.  Thererfore, I would recommend repeating the CTA on Thursday after he has had time for hydration given his underlying renal insufficiency.  If he had a spontaneous renal artery dissection (which is rare, unless he has FMD which could not be determined by his CTA), he would require 3 months of anticoagulation and then conversion to anti-platelet therapy.  In the meantime,  continue with IV heparin and strict blood pressure control with close monitoring of his renal function.  There is no role for acute surgical intervention or stenting, however this could be considered at a later date.  I will order his CTA.  Annamarie Major

## 2015-03-13 NOTE — Progress Notes (Signed)
Echocardiogram 2D Echocardiogram has been performed.  Dylan Sweeney 03/13/2015, 2:22 PM

## 2015-03-13 NOTE — Progress Notes (Signed)
[facility] PROGRESS NOTE  TYMOTHY COTTON [id number] DOB: 05/19/1970 DOA: 03/12/2015 PCP: No primary care provider on file.  Length of stay: 1   Assessment/Plan: Active Problems:   Hypertensive urgency   Hypertensive urgency, malignant   Marijuana use   Hypertensive urgency, malignant / R/O left renal artery dissection -in the setting of known history of hypertension, patient on HCTZ, Cozaar, Aldactone, hydralazine at home. He states that he has been compliant with his medications. CT imaging concerning for focal narrowing of the left renal artery, concern for focal left renal artery dissection vs vasculitis. 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 Continue hydralazine, continue when necessary labetalol, taper off nitroglycerin drip Continue stepdown Dr. Florene Glen , , Dr Trula Slade will make further recommendations. Continue anticoagulation with heparin drip, duration of anticoagulation to be specified by vascular surgery Interventional radiology, Dr. Daryll Brod was also notified yesterday .  Marijuana use-counseling done  Asthma-continue Advair, albuterol, stable  Alcohol abuse patient continue on CIWA protocol   DVT prophylaxsis heparin drip  Code Status:      Code Status Orders        Start     Ordered   03/12/15 1917  Full code   Continuous     03/12/15 1918     Family Communication: family updated about patient's clinical progress Disposition Plan:  Continue stepdown   Brief narrative: 44 year old male with a history of chronic kidney disease stage II followed at the [facility] in [place], hypertension, asthma which is under control, presents to the ER with left flank pain. Blood pressure upon arrival was  222/148. Flank pain is described as dull, constant pain, not particularly worse with movement, not reproducible to palpation. Denies any history of nephrolithiasis. Patient received 5 doses of IV labetalol, subsequently started on a nitroglycerin drip. CT scan which was performed to rule out dissection showed an irregularity and focal narrowing of the left renal artery with surrounding fat stranding. Findings nonspecific but ddx include renal artery dissection or vasculitis. Patient has a history of sickle cell trait, admits to using marijuana, denies cocaine use, drinks beer on a daily basis.    Consultants:  Nephrology  Vascular surgery  Procedures:  None none  Antibiotics: Anti-infectives    None         HPI/Subjective: Complains of a headache because of the nitroglycerin drip, also complains of left flank pain, blood pressure better controlled this morning.  Objective: Filed Vitals:   03/13/15 0513 03/13/15 0600 03/13/15 0700 03/13/15 0808  BP: 176/109 168/107 158/104   Pulse:  61 95   Temp:      TempSrc:      Resp:  19 16   Height:      Weight:      SpO2:  95% 97% 97%    Intake/Output Summary (Last 24 hours) at 03/13/15 0933 Last data filed at 03/13/15 0700  Gross per 24 hour  Intake 1507.5 ml  Output    800 ml  Net  707.5 ml    Exam:  General: No acute respiratory distress Lungs: Clear to auscultation bilaterally without wheezes or crackles Cardiovascular: Regular rate and rhythm without murmur gallop or rub normal S1 and S2 Abdomen: Nontender, nondistended, soft, bowel sounds positive, no rebound, no ascites, no appreciable mass Extremities: No significant cyanosis, clubbing, or  edema bilateral lower extremities     Data Review   Micro Results Recent Results (from the past 240 hour(s))  MRSA PCR Screening     Status: None   Collection Time: 03/12/15 10:47 PM  Result Value Ref Range Status   MRSA by PCR NEGATIVE NEGATIVE Final    Comment:         The GeneXpert MRSA Assay (FDA approved for NASAL specimens only), is one component of a comprehensive MRSA colonization surveillance program. It is not intended to diagnose MRSA infection nor to guide or monitor treatment for MRSA infections.     Radiology Reports Ct Angio Chest Aorta W/cm &/or Wo/cm  03/12/2015  CLINICAL DATA:  Patient with back pain. Evaluate for aortic dissection. EXAM: CT ANGIOGRAPHY CHEST, ABDOMEN AND PELVIS TECHNIQUE: Multidetector CT imaging of the chest, abdomen and pelvis was performed using the standard protocol during bolus administration of intravenous contrast. Multiplanar CT image reconstructions and MIPs were obtained to evaluate the vascular anatomy. CONTRAST:  74mL OMNIPAQUE IOHEXOL 350 MG/ML SOLN COMPARISON:  None. FINDINGS: CTA CHEST FINDINGS Mediastinum/Nodes: Noncontrast imaging through the thorax demonstrates no peripheral high attenuation within the thoracic aorta to suggest intramural hematoma formation. Post contrast-enhanced images demonstrate motion artifact limiting evaluation of the aortic root. The thoracic aorta is opacified with contrast material. No evidence for acute thoracic aortic dissection. The takeoff of the great vessels are patent. Visualized thyroid is unremarkable. No enlarged axillary, mediastinal or hilar lymphadenopathy. Heart is normal in size. No pericardial effusion. Aorta and main pulmonary artery normal in caliber. No evidence for central pulmonary embolism. Coronary arterial vascular calcifications. Lungs/Pleura: The central airways are patent. No consolidative pulmonary opacities. No pleural effusion or pneumothorax. Review of the MIP images confirms the above findings. CTA ABDOMEN FINDINGS Hepatobiliary: Liver is normal in size and contour. No focal hepatic lesion is identified. The gallbladder is unremarkable. No intrahepatic or extrahepatic biliary ductal dilatation. Pancreas: Unremarkable Spleen: Unremarkable Adrenals/Urinary  Tract: The adrenal glands are normal. The kidneys enhance symmetrically with contrast. Multiple bilateral low-attenuation subcentimeter renal lesions are demonstrated, too small to accurately characterize. There is circumferential wall thickening of the urinary bladder. Stomach/Bowel: No abnormal bowel wall thickening or evidence for bowel obstruction. No free fluid or free intraperitoneal air. No evidence for bowel obstruction. Vascular/Lymphatic: The abdominal aorta is patent and normal in caliber. There is fat stranding about the left renal artery with apparent focal areas of narrowing, demonstrated best on sagittal images (image 103; series 503). The branch vessels including the celiac, superior mesenteric and inferior mesenteric arteries are patent. The common iliac vessels as well as the internal and external iliac vessels are patent. Other: Prostate enlarged. Musculoskeletal: No aggressive or acute appearing osseous lesions. Review of the MIP images confirms the above findings. IMPRESSION: There is irregularity and focal narrowing of the left renal artery with with surrounding fat stranding. There is some motion artifact limiting evaluation of this location. Overall findings are nonspecific however the top differential considerations would be focal left renal artery dissection or vasculitis. Additional but less likely consideration would be left renal artery infection in the setting of pyelonephritis. On single phase contrast-enhanced exam, the left kidney appears perfused. Critical Value/emergent results were called by telephone at the time of interpretation on 03/12/2015 at 3:28 pm to Dr. Eulis Foster, who verbally acknowledged these results. Electronically Signed   By: Lovey Newcomer M.D.   On: 03/12/2015 15:30   Ct Angio Abd/pel W/ And/or W/o  03/12/2015  CLINICAL DATA:  Patient  with back pain. Evaluate for aortic dissection. EXAM: CT ANGIOGRAPHY CHEST, ABDOMEN AND PELVIS TECHNIQUE: Multidetector CT imaging of  the chest, abdomen and pelvis was performed using the standard protocol during bolus administration of intravenous contrast. Multiplanar CT image reconstructions and MIPs were obtained to evaluate the vascular anatomy. CONTRAST:  64mL OMNIPAQUE IOHEXOL 350 MG/ML SOLN COMPARISON:  None. FINDINGS: CTA CHEST FINDINGS Mediastinum/Nodes: Noncontrast imaging through the thorax demonstrates no peripheral high attenuation within the thoracic aorta to suggest intramural hematoma formation. Post contrast-enhanced images demonstrate motion artifact limiting evaluation of the aortic root. The thoracic aorta is opacified with contrast material. No evidence for acute thoracic aortic dissection. The takeoff of the great vessels are patent. Visualized thyroid is unremarkable. No enlarged axillary, mediastinal or hilar lymphadenopathy. Heart is normal in size. No pericardial effusion. Aorta and main pulmonary artery normal in caliber. No evidence for central pulmonary embolism. Coronary arterial vascular calcifications. Lungs/Pleura: The central airways are patent. No consolidative pulmonary opacities. No pleural effusion or pneumothorax. Review of the MIP images confirms the above findings. CTA ABDOMEN FINDINGS Hepatobiliary: Liver is normal in size and contour. No focal hepatic lesion is identified. The gallbladder is unremarkable. No intrahepatic or extrahepatic biliary ductal dilatation. Pancreas: Unremarkable Spleen: Unremarkable Adrenals/Urinary Tract: The adrenal glands are normal. The kidneys enhance symmetrically with contrast. Multiple bilateral low-attenuation subcentimeter renal lesions are demonstrated, too small to accurately characterize. There is circumferential wall thickening of the urinary bladder. Stomach/Bowel: No abnormal bowel wall thickening or evidence for bowel obstruction. No free fluid or free intraperitoneal air. No evidence for bowel obstruction. Vascular/Lymphatic: The abdominal aorta is patent and  normal in caliber. There is fat stranding about the left renal artery with apparent focal areas of narrowing, demonstrated best on sagittal images (image 103; series 503). The branch vessels including the celiac, superior mesenteric and inferior mesenteric arteries are patent. The common iliac vessels as well as the internal and external iliac vessels are patent. Other: Prostate enlarged. Musculoskeletal: No aggressive or acute appearing osseous lesions. Review of the MIP images confirms the above findings. IMPRESSION: There is irregularity and focal narrowing of the left renal artery with with surrounding fat stranding. There is some motion artifact limiting evaluation of this location. Overall findings are nonspecific however the top differential considerations would be focal left renal artery dissection or vasculitis. Additional but less likely consideration would be left renal artery infection in the setting of pyelonephritis. On single phase contrast-enhanced exam, the left kidney appears perfused. Critical Value/emergent results were called by telephone at the time of interpretation on 03/12/2015 at 3:28 pm to Dr. Eulis Foster, who verbally acknowledged these results. Electronically Signed   By: Lovey Newcomer M.D.   On: 03/12/2015 15:30     CBC  Recent Labs Lab 03/12/15 1335 03/12/15 1345 03/12/15 2115 03/13/15 0710  WBC 10.9*  --  13.1* 14.5*  HGB 15.5 17.0 13.6 13.1  HCT 44.9 50.0 39.9 38.5*  PLT 276  --  242 239  MCV 84.9  --  86.0 85.7  MCH 29.3  --  29.3 29.2  MCHC 34.5  --  34.1 34.0  RDW 12.1  --  12.1 12.3  LYMPHSABS 2.4  --   --   --   MONOABS 0.5  --   --   --   EOSABS 0.6  --   --   --   BASOSABS 0.0  --   --   --     Chemistries   Recent Labs Lab 03/12/15  1345 03/12/15 2115 03/13/15 0049  NA 138  --  136  K 3.7  --  3.2*  CL 100*  --  100*  CO2  --   --  28  GLUCOSE 95  --  105*  BUN 24*  --  17  CREATININE 1.80* 2.15* 1.91*  CALCIUM  --   --  8.9  MG  --  2.0  --    AST  --  18 17  ALT  --  15* 14*  ALKPHOS  --  48 47  BILITOT  --  0.7 0.7   ------------------------------------------------------------------------------------------------------------------ estimated creatinine clearance is 51.5 mL/min (by C-G formula based on Cr of 1.91). ------------------------------------------------------------------------------------------------------------------ No results for input(s): HGBA1C in the last 72 hours. ------------------------------------------------------------------------------------------------------------------  Recent Labs  03/13/15 0049  CHOL 163  HDL 32*  LDLCALC 66  TRIG 325*  CHOLHDL 5.1   ------------------------------------------------------------------------------------------------------------------  Recent Labs  03/12/15 2115  TSH 1.597   ------------------------------------------------------------------------------------------------------------------ No results for input(s): VITAMINB12, FOLATE, FERRITIN, TIBC, IRON, RETICCTPCT in the last 72 hours.  Coagulation profile No results for input(s): INR, PROTIME in the last 168 hours.  No results for input(s): DDIMER in the last 72 hours.  Cardiac Enzymes  Recent Labs Lab 03/12/15 2115 03/13/15 0049 03/13/15 0710  TROPONINI <0.03 <0.03 <0.03   ------------------------------------------------------------------------------------------------------------------ Invalid input(s): POCBNP   CBG: No results for input(s): GLUCAP in the last 168 hours.     Studies: Ct Angio Chest Aorta W/cm &/or Wo/cm  03/12/2015  CLINICAL DATA:  Patient with back pain. Evaluate for aortic dissection. EXAM: CT ANGIOGRAPHY CHEST, ABDOMEN AND PELVIS TECHNIQUE: Multidetector CT imaging of the chest, abdomen and pelvis was performed using the standard protocol during bolus administration of intravenous contrast. Multiplanar CT image reconstructions and MIPs were obtained to evaluate the  vascular anatomy. CONTRAST:  7mL OMNIPAQUE IOHEXOL 350 MG/ML SOLN COMPARISON:  None. FINDINGS: CTA CHEST FINDINGS Mediastinum/Nodes: Noncontrast imaging through the thorax demonstrates no peripheral high attenuation within the thoracic aorta to suggest intramural hematoma formation. Post contrast-enhanced images demonstrate motion artifact limiting evaluation of the aortic root. The thoracic aorta is opacified with contrast material. No evidence for acute thoracic aortic dissection. The takeoff of the great vessels are patent. Visualized thyroid is unremarkable. No enlarged axillary, mediastinal or hilar lymphadenopathy. Heart is normal in size. No pericardial effusion. Aorta and main pulmonary artery normal in caliber. No evidence for central pulmonary embolism. Coronary arterial vascular calcifications. Lungs/Pleura: The central airways are patent. No consolidative pulmonary opacities. No pleural effusion or pneumothorax. Review of the MIP images confirms the above findings. CTA ABDOMEN FINDINGS Hepatobiliary: Liver is normal in size and contour. No focal hepatic lesion is identified. The gallbladder is unremarkable. No intrahepatic or extrahepatic biliary ductal dilatation. Pancreas: Unremarkable Spleen: Unremarkable Adrenals/Urinary Tract: The adrenal glands are normal. The kidneys enhance symmetrically with contrast. Multiple bilateral low-attenuation subcentimeter renal lesions are demonstrated, too small to accurately characterize. There is circumferential wall thickening of the urinary bladder. Stomach/Bowel: No abnormal bowel wall thickening or evidence for bowel obstruction. No free fluid or free intraperitoneal air. No evidence for bowel obstruction. Vascular/Lymphatic: The abdominal aorta is patent and normal in caliber. There is fat stranding about the left renal artery with apparent focal areas of narrowing, demonstrated best on sagittal images (image 103; series 503). The branch vessels including the  celiac, superior mesenteric and inferior mesenteric arteries are patent. The common iliac vessels as well as the internal and external iliac vessels are patent. Other: Prostate enlarged.  Musculoskeletal: No aggressive or acute appearing osseous lesions. Review of the MIP images confirms the above findings. IMPRESSION: There is irregularity and focal narrowing of the left renal artery with with surrounding fat stranding. There is some motion artifact limiting evaluation of this location. Overall findings are nonspecific however the top differential considerations would be focal left renal artery dissection or vasculitis. Additional but less likely consideration would be left renal artery infection in the setting of pyelonephritis. On single phase contrast-enhanced exam, the left kidney appears perfused. Critical Value/emergent results were called by telephone at the time of interpretation on 03/12/2015 at 3:28 pm to Dr. Eulis Foster, who verbally acknowledged these results. Electronically Signed   By: Lovey Newcomer M.D.   On: 03/12/2015 15:30   Ct Angio Abd/pel W/ And/or W/o  03/12/2015  CLINICAL DATA:  Patient with back pain. Evaluate for aortic dissection. EXAM: CT ANGIOGRAPHY CHEST, ABDOMEN AND PELVIS TECHNIQUE: Multidetector CT imaging of the chest, abdomen and pelvis was performed using the standard protocol during bolus administration of intravenous contrast. Multiplanar CT image reconstructions and MIPs were obtained to evaluate the vascular anatomy. CONTRAST:  80mL OMNIPAQUE IOHEXOL 350 MG/ML SOLN COMPARISON:  None. FINDINGS: CTA CHEST FINDINGS Mediastinum/Nodes: Noncontrast imaging through the thorax demonstrates no peripheral high attenuation within the thoracic aorta to suggest intramural hematoma formation. Post contrast-enhanced images demonstrate motion artifact limiting evaluation of the aortic root. The thoracic aorta is opacified with contrast material. No evidence for acute thoracic aortic dissection. The  takeoff of the great vessels are patent. Visualized thyroid is unremarkable. No enlarged axillary, mediastinal or hilar lymphadenopathy. Heart is normal in size. No pericardial effusion. Aorta and main pulmonary artery normal in caliber. No evidence for central pulmonary embolism. Coronary arterial vascular calcifications. Lungs/Pleura: The central airways are patent. No consolidative pulmonary opacities. No pleural effusion or pneumothorax. Review of the MIP images confirms the above findings. CTA ABDOMEN FINDINGS Hepatobiliary: Liver is normal in size and contour. No focal hepatic lesion is identified. The gallbladder is unremarkable. No intrahepatic or extrahepatic biliary ductal dilatation. Pancreas: Unremarkable Spleen: Unremarkable Adrenals/Urinary Tract: The adrenal glands are normal. The kidneys enhance symmetrically with contrast. Multiple bilateral low-attenuation subcentimeter renal lesions are demonstrated, too small to accurately characterize. There is circumferential wall thickening of the urinary bladder. Stomach/Bowel: No abnormal bowel wall thickening or evidence for bowel obstruction. No free fluid or free intraperitoneal air. No evidence for bowel obstruction. Vascular/Lymphatic: The abdominal aorta is patent and normal in caliber. There is fat stranding about the left renal artery with apparent focal areas of narrowing, demonstrated best on sagittal images (image 103; series 503). The branch vessels including the celiac, superior mesenteric and inferior mesenteric arteries are patent. The common iliac vessels as well as the internal and external iliac vessels are patent. Other: Prostate enlarged. Musculoskeletal: No aggressive or acute appearing osseous lesions. Review of the MIP images confirms the above findings. IMPRESSION: There is irregularity and focal narrowing of the left renal artery with with surrounding fat stranding. There is some motion artifact limiting evaluation of this location.  Overall findings are nonspecific however the top differential considerations would be focal left renal artery dissection or vasculitis. Additional but less likely consideration would be left renal artery infection in the setting of pyelonephritis. On single phase contrast-enhanced exam, the left kidney appears perfused. Critical Value/emergent results were called by telephone at the time of interpretation on 03/12/2015 at 3:28 pm to Dr. Eulis Foster, who verbally acknowledged these results. Electronically Signed   By: Dian Situ  Rosana Hoes M.D.   On: 03/12/2015 15:30      No results found for: HGBA1C Lab Results  Component Value Date   LDLCALC 66 03/13/2015   CREATININE 1.91* 03/13/2015       Scheduled Meds: . folic acid  1 mg Oral Daily  . hydrALAZINE  100 mg Oral 3 times per day  . mometasone-formoterol  2 puff Inhalation BID  . multivitamin with minerals  1 tablet Oral Daily  . omega-3 acid ethyl esters  1 g Oral BID  . sodium chloride  3 mL Intravenous Q12H  . tamsulosin  0.4 mg Oral QPC breakfast  . thiamine  100 mg Oral Daily   Or  . thiamine  100 mg Intravenous Daily   Continuous Infusions: . 0.9 % NaCl with KCl 40 mEq / L 75 mL/hr (03/13/15 0825)  . heparin 1,300 Units/hr (03/13/15 0248)  . nitroGLYCERIN 30 mcg/min (03/13/15 0510)    Active Problems:   Hypertensive urgency   Hypertensive urgency, malignant   Marijuana use    Time spent: 45 minutes   [facility] Pager [phone]. If 7PM-7AM, please contact night-coverage at [email or web address], password [facility] 03/13/2015, 9:33 AM  LOS: 1 day

## 2015-03-13 NOTE — Progress Notes (Signed)
ANTICOAGULATION CONSULT NOTE - Follow up [place] for Heparin Indication: Renal artery dissection  Allergies  Allergen Reactions  . Other Other (See Comments)    G6 pill allergy: pill given before travel in [place]    Patient Measurements: Height: 5\' 11"  (180.3 cm) Weight: 187 lb 13.3 oz (85.2 kg) IBW/kg (Calculated) : 75.3   Vital Signs: Temp: 98.7 F (37.1 C) (11/08 2347) Temp Source: Oral (11/08 2347) BP: 168/111 mmHg (11/09 0100) Pulse Rate: 82 (11/09 0100)  Labs:  Recent Labs  03/12/15 1335 03/12/15 1345 03/12/15 2115 03/13/15 0049  HGB 15.5 17.0 13.6  --   HCT 44.9 50.0 39.9  --   PLT 276  --  242  --   HEPARINUNFRC  --   --   --  0.13*  CREATININE  --  1.80* 2.15* 1.91*  TROPONINI  --   --  <0.03 <0.03    Estimated Creatinine Clearance: 51.5 mL/min (by C-G formula based on Cr of 1.91).   Medical History: Past Medical History  Diagnosis Date  . Hypertension   . Renal disorder   . Asthma   . Renal insufficiency   . Sickle cell anemia (HCC)     Has the trait-per patient (03/12/15)    Assessment: 44 year old male with a history of chronic kidney disease stage II  hypertension, asthma,  presented to the ER with left flank pain.  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 Currently on IV heparin. Initial heparin level drawn 2 hours early but is low at 0.13. Suspect a well timed draw would also have resulted in a low level. H/H and Plt wnl. RN reports no s/s of bleeding   Goal of Therapy:  Heparin level 0.3-0.7 units/ml Monitor platelets by anticoagulation protocol: Yes   Plan:  -Give heparin IV 2000 units bolus -Increase heparin drip to 1300 units / hr  -F/u 6 hr HL -Heparin level and CBC daily  Albertina Parr, PharmD., BCPS Clinical Pharmacist Pager [phone]

## 2015-03-13 NOTE — Progress Notes (Signed)
Utilization Review Completed.  

## 2015-03-14 ENCOUNTER — Inpatient Hospital Stay (HOSPITAL_COMMUNITY): Payer: Non-veteran care

## 2015-03-14 LAB — HEMOGLOBIN A1C
HEMOGLOBIN A1C: 5 % (ref 4.8–5.6)
MEAN PLASMA GLUCOSE: 97 mg/dL

## 2015-03-14 LAB — COMPREHENSIVE METABOLIC PANEL
ALK PHOS: 40 U/L (ref 38–126)
ALT: 14 U/L — AB (ref 17–63)
AST: 16 U/L (ref 15–41)
Albumin: 3.2 g/dL — ABNORMAL LOW (ref 3.5–5.0)
Anion gap: 6 (ref 5–15)
BUN: 11 mg/dL (ref 6–20)
CALCIUM: 8.9 mg/dL (ref 8.9–10.3)
CO2: 26 mmol/L (ref 22–32)
CREATININE: 1.91 mg/dL — AB (ref 0.61–1.24)
Chloride: 104 mmol/L (ref 101–111)
GFR calc non Af Amer: 40 mL/min — ABNORMAL LOW (ref >60)
GFR, EST AFRICAN AMERICAN: 47 mL/min — AB (ref >60)
GLUCOSE: 105 mg/dL — AB (ref 65–99)
Potassium: 3.6 mmol/L (ref 3.5–5.1)
SODIUM: 136 mmol/L (ref 135–145)
Total Bilirubin: 0.9 mg/dL (ref 0.3–1.2)
Total Protein: 7.1 g/dL (ref 6.5–8.1)

## 2015-03-14 LAB — CBC
HCT: 40.6 % (ref 39.0–52.0)
HEMOGLOBIN: 13.5 g/dL (ref 13.0–17.0)
MCH: 28.6 pg (ref 26.0–34.0)
MCHC: 33.3 g/dL (ref 30.0–36.0)
MCV: 86 fL (ref 78.0–100.0)
Platelets: 268 10*3/uL (ref 150–400)
RBC: 4.72 MIL/uL (ref 4.22–5.81)
RDW: 12.4 % (ref 11.5–15.5)
WBC: 13.3 10*3/uL — AB (ref 4.0–10.5)

## 2015-03-14 LAB — ANTINUCLEAR ANTIBODIES, IFA: ANA Ab, IFA: NEGATIVE

## 2015-03-14 LAB — HEPARIN LEVEL (UNFRACTIONATED)
Heparin Unfractionated: 0.27 IU/mL — ABNORMAL LOW (ref 0.30–0.70)
Heparin Unfractionated: 0.12 IU/mL — ABNORMAL LOW (ref 0.30–0.70)

## 2015-03-14 LAB — MITOCHONDRIAL ANTIBODIES: Mitochondrial M2 Ab, IgG: 11.7 Units (ref 0.0–20.0)

## 2015-03-14 LAB — MPO/PR-3 (ANCA) ANTIBODIES
ANCA Proteinase 3: <3.5 U/mL (ref 0.0–3.5)
Myeloperoxidase Abs: <9 U/mL (ref 0.0–9.0)

## 2015-03-14 LAB — CYCLIC CITRUL PEPTIDE ANTIBODY, IGG/IGA: CCP Antibodies IgG/IgA: 8 units (ref 0–19)

## 2015-03-14 LAB — ANCA TITERS: C-ANCA: <1:20 {titer}

## 2015-03-14 LAB — ANTI-MICROSOMAL ANTIBODY LIVER / KIDNEY: LKM1 AB: 1.8 U (ref 0.0–20.0)

## 2015-03-14 LAB — ANTI-DNA ANTIBODY, DOUBLE-STRANDED: ds DNA Ab: 1 IU/mL (ref 0–9)

## 2015-03-14 MED ORDER — HYDRALAZINE HCL 20 MG/ML IJ SOLN
15.0000 mg | Freq: Once | INTRAMUSCULAR | Status: AC
  Administered 2015-03-14: 15 mg via INTRAVENOUS
  Filled 2015-03-14: qty 1

## 2015-03-14 MED ORDER — LABETALOL HCL 200 MG PO TABS
300.0000 mg | ORAL_TABLET | Freq: Two times a day (BID) | ORAL | Status: DC
  Administered 2015-03-14: 300 mg via ORAL
  Filled 2015-03-14: qty 2

## 2015-03-14 MED ORDER — IOHEXOL 350 MG/ML SOLN
100.0000 mL | Freq: Once | INTRAVENOUS | Status: AC | PRN
  Administered 2015-03-14: 80 mL via INTRAVENOUS

## 2015-03-14 MED ORDER — SODIUM CHLORIDE 0.9 % IV SOLN
INTRAVENOUS | Status: AC
  Administered 2015-03-14: 09:00:00 via INTRAVENOUS

## 2015-03-14 MED ORDER — SPIRONOLACTONE 50 MG PO TABS
50.0000 mg | ORAL_TABLET | Freq: Every day | ORAL | Status: DC
  Administered 2015-03-14: 50 mg via ORAL
  Filled 2015-03-14 (×2): qty 1

## 2015-03-14 NOTE — Progress Notes (Signed)
ANTICOAGULATION CONSULT NOTE - Follow up [place] for Heparin Indication: Renal artery dissection  Allergies  Allergen Reactions  . Other Other (See Comments)    G6 pill allergy: pill given before travel in [place]    Patient Measurements: Height: 5\' 11"  (180.3 cm) Weight: 187 lb 13.3 oz (85.2 kg) IBW/kg (Calculated) : 75.3  Heparin Wt: 85.2 kg   Vital Signs: Temp: 98.7 F (37.1 C) (11/10 1615) Temp Source: Oral (11/10 1615) BP: 188/129 mmHg (11/10 1615) Pulse Rate: 92 (11/10 1615)  Labs:  Recent Labs  03/12/15 2115  03/13/15 0049 03/13/15 0710 03/13/15 0810 03/14/15 0412 03/14/15 1546  HGB 13.6  --   --  13.1  --  13.5  --   HCT 39.9  --   --  38.5*  --  40.6  --   PLT 242  --   --  239  --  268  --   HEPARINUNFRC  --   < > 0.13*  --  0.36 0.27* 0.12*  CREATININE 2.15*  --  1.91*  --   --  1.91*  --   TROPONINI <0.03  --  <0.03 <0.03  --   --   --   < > = values in this interval not displayed.  Estimated Creatinine Clearance: 51.5 mL/min (by C-G formula based on Cr of 1.91).   Medical History: Past Medical History  Diagnosis Date  . Hypertension   . Renal disorder   . Asthma   . Renal insufficiency   . Sickle cell anemia (HCC)     Has the trait-per patient (03/12/15)    Assessment: 60 YOM with hx CKD II and HTN who presented to the [facility] on 11/8 with left flank pain.  Abdominal CT showed possible renal artery dissection and pharmacy was consulted to start heparin for anticoagulation.  HL subtherapeutic (0.12) after increase to 1450 units/h. CBC wnl. No bleed IV/line issues per RN. Final call on anticoag to be made tomorrow per Vasc Surg Dr. Trula Slade.  Goal of Therapy:  Heparin level 0.3-0.7 units/ml Monitor platelets by anticoagulation protocol: Yes   Plan: Increase heparin to 1750 units/h 6h HL Daily HL/CBC Mon s/sx bleeding  Elicia Lamp, PharmD Clinical Pharmacist Pager [phone] 03/14/2015 5:29 PM

## 2015-03-14 NOTE — Progress Notes (Signed)
ANTICOAGULATION CONSULT NOTE - Follow up [place] for Heparin Indication: Renal artery dissection  Allergies  Allergen Reactions  . Other Other (See Comments)    G6 pill allergy: pill given before travel in [place]    Patient Measurements: Height: 5\' 11"  (180.3 cm) Weight: 187 lb 13.3 oz (85.2 kg) IBW/kg (Calculated) : 75.3  Heparin Wt: 85.2 kg   Vital Signs: Temp: 98.2 F (36.8 C) (11/10 0400) Temp Source: Oral (11/10 0400) BP: 158/102 mmHg (11/10 0500) Pulse Rate: 98 (11/10 0500)  Labs:  Recent Labs  03/12/15 2115 03/13/15 0049 03/13/15 0710 03/13/15 0810 03/14/15 0412  HGB 13.6  --  13.1  --  13.5  HCT 39.9  --  38.5*  --  40.6  PLT 242  --  239  --  268  HEPARINUNFRC  --  0.13*  --  0.36 0.27*  CREATININE 2.15* 1.91*  --   --  1.91*  TROPONINI <0.03 <0.03 <0.03  --   --     Estimated Creatinine Clearance: 51.5 mL/min (by C-G formula based on Cr of 1.91).   Medical History: Past Medical History  Diagnosis Date  . Hypertension   . Renal disorder   . Asthma   . Renal insufficiency   . Sickle cell anemia (HCC)     Has the trait-per patient (03/12/15)    Assessment: 37 YOM with hx CKD II and HTN who presented to the [facility] on 11/8 with left flank pain.  Abdominal CT showed possible renal artery dissection and pharmacy was consulted to start heparin for anticoagulation.  The patient's heparin level this morning is slight SUBtherapeutic (HL 0.27, goal of 0.3-0.7). CBC stable - no s/sx of bleeding noted.   Goal of Therapy:  Heparin level 0.3-0.7 units/ml Monitor platelets by anticoagulation protocol: Yes   Plan:  1. Increase heparin drip rate to 1450 units/hr (14.5 ml/hr) 2. Will continue to monitor for any signs/symptoms of bleeding and will follow up with heparin level in 6 hours   Alycia Rossetti, PharmD, BCPS Clinical Pharmacist Pager: [phone] 03/14/2015 7:12 AM

## 2015-03-14 NOTE — Progress Notes (Addendum)
[facility] PROGRESS NOTE  RAMELLO CORDIAL BZM:[id number] DOB: 10/13/1970 DOA: 03/12/2015 PCP: No primary care provider on file.  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 He states that he has been compliant with his medications. CT imaging concerning for focal narrowing of the left renal artery, concern for focal left renal artery dissection vs vasculitis.  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 Hold Cozaar Follow renal function closely  Left renal artery dissection Pending vasculitic workup including antibody panel which is still pending, CRP mildly elevated, ESR normal, CTA today, vascular surgery to make further recommendations Continue stepdown Continue anticoagulation with heparin drip, duration of anticoagulation to be specified by vascular surgery Interventional radiology, Dr. Daryll Brod was also notified about the patient's presentation   .AKI on CKD II - Pt. States that his baseline creatinine is 1.7. Cr - 1.8 on admission then up to 2 and now 1.9. He received Contrast for CTA. He says he follows with a nephrologist from the [place] in [facility]. Currently on normal saline at 75 mL per hour, anticipate this will be discharged tomorrow. Likely combination of HTN, and IV contrast.   Marijuana use-counseling done  Asthma-continue Advair, albuterol, stable  Alcohol abuse patient continue on CIWA protocol  BPH? Continue  Flomax.   DVT prophylaxsis heparin drip  Code Status:      Code Status Orders        Start     Ordered   03/12/15 1917  Full code   Continuous     03/12/15 1918     Family Communication: family updated about patient's clinical progress Disposition Plan:  Continue stepdown, not stable for discharge, plan as above   Brief narrative: 44 year old male with a history of chronic kidney disease stage II followed at the [facility] in [place], hypertension, asthma which is under control, presents to the ER with left flank pain. Blood pressure upon arrival was 222/148. Flank pain is described as dull, constant pain, not particularly worse with movement, not reproducible to palpation. Denies any history of nephrolithiasis. Patient received 5 doses of IV labetalol, subsequently started on a nitroglycerin drip. CT scan which was performed to rule out dissection showed an irregularity and focal narrowing of the left renal artery with surrounding fat stranding. Findings nonspecific but ddx include renal artery dissection or vasculitis. Patient has a history of sickle cell trait, admits to using marijuana, denies cocaine use, drinks beer on a daily basis.    Consultants:  Nephrology  Vascular surgery  Procedures:  None none  Antibiotics: Anti-infectives    None         HPI/Subjective: Headache better after discontinuation of nitroglycerin drip   Objective: Filed Vitals:   03/14/15 0719 03/14/15 0800 03/14/15 0900 03/14/15 1000  BP:  162/113 152/94 153/99  Pulse:  98 104 95  Temp:  98.1 F (36.7 C)    TempSrc:  Oral    Resp:  20 17 17   Height:  Weight:      SpO2: 93% 93% 94% 92%    Intake/Output Summary (Last 24 hours) at 03/14/15 1059 Last data filed at 03/14/15 1002  Gross per 24 hour  Intake 1586.47 ml  Output   2800 ml  Net -1213.53 ml    Exam:  General: NAD, AAOx3, resting in bed.  Cardiovascular: RRR, no MGR, normal S1/S2, 2+ distal pulses.   Respiratory: CTA Bilaterally, appropriate rate, unlabored.  Abdomen: S, NT, ND, No abdominal bruits audible. +BS.  Extremities: WWP, 2+ distal pulses, no edema, MAEW.  Data Review   Micro Results Recent Results (from the past 240 hour(s))  MRSA PCR Screening     Status: None   Collection Time: 03/12/15 10:47 PM  Result Value Ref Range Status   MRSA by PCR NEGATIVE NEGATIVE Final    Comment:        The GeneXpert MRSA Assay (FDA approved for NASAL specimens only), is one component of a comprehensive MRSA colonization surveillance program. It is not intended to diagnose MRSA infection nor to guide or monitor treatment for MRSA infections.     Radiology Reports Ct Angio Chest Aorta W/cm &/or Wo/cm  03/12/2015  CLINICAL DATA:  Patient with back pain. Evaluate for aortic dissection. EXAM: CT ANGIOGRAPHY CHEST, ABDOMEN AND PELVIS TECHNIQUE: Multidetector CT imaging of the chest, abdomen and pelvis was performed using the standard protocol during bolus administration of intravenous contrast. Multiplanar CT image reconstructions and MIPs were obtained to evaluate the vascular anatomy. CONTRAST:  30m OMNIPAQUE IOHEXOL 350 MG/ML SOLN COMPARISON:  None. FINDINGS: CTA CHEST FINDINGS Mediastinum/Nodes: Noncontrast imaging through the thorax demonstrates no peripheral high attenuation within the thoracic aorta to suggest intramural hematoma formation. Post contrast-enhanced images demonstrate motion artifact limiting evaluation of the aortic root. The thoracic aorta is opacified with contrast material. No evidence for acute thoracic aortic dissection. The takeoff of the great vessels are patent. Visualized thyroid is unremarkable. No enlarged axillary, mediastinal or hilar lymphadenopathy. Heart is normal in size. No pericardial effusion. Aorta and main pulmonary artery normal in caliber. No evidence for central pulmonary embolism. Coronary arterial vascular calcifications. Lungs/Pleura: The  central airways are patent. No consolidative pulmonary opacities. No pleural effusion or pneumothorax. Review of the MIP images confirms the above findings. CTA ABDOMEN FINDINGS Hepatobiliary: Liver is normal in size and contour. No focal hepatic lesion is identified. The gallbladder is unremarkable. No intrahepatic or extrahepatic biliary ductal dilatation. Pancreas: Unremarkable Spleen: Unremarkable Adrenals/Urinary Tract: The adrenal glands are normal. The kidneys enhance symmetrically with contrast. Multiple bilateral low-attenuation subcentimeter renal lesions are demonstrated, too small to accurately characterize. There is circumferential wall thickening of the urinary bladder. Stomach/Bowel: No abnormal bowel wall thickening or evidence for bowel obstruction. No free fluid or free intraperitoneal air. No evidence for bowel obstruction. Vascular/Lymphatic: The abdominal aorta is patent and normal in caliber. There is fat stranding about the left renal artery with apparent focal areas of narrowing, demonstrated best on sagittal images (image 103; series 503). The branch vessels including the celiac, superior mesenteric and inferior mesenteric arteries are patent. The common iliac vessels as well as the internal and external iliac vessels are patent. Other: Prostate enlarged. Musculoskeletal: No aggressive or acute appearing osseous lesions. Review of the MIP images confirms the above findings. IMPRESSION: There is irregularity and focal narrowing of the left renal artery with with surrounding fat stranding. There is some motion artifact limiting evaluation of this location. Overall findings are nonspecific however the top differential considerations would be  focal left renal artery dissection or vasculitis. Additional but less likely consideration would be left renal artery infection in the setting of pyelonephritis. On single phase contrast-enhanced exam, the left kidney appears perfused. Critical  Value/emergent results were called by telephone at the time of interpretation on 03/12/2015 at 3:28 pm to Dr. Eulis Foster, who verbally acknowledged these results. Electronically Signed   By: Lovey Newcomer M.D.   On: 03/12/2015 15:30   Ct Angio Abd/pel W/ And/or W/o  03/12/2015  CLINICAL DATA:  Patient with back pain. Evaluate for aortic dissection. EXAM: CT ANGIOGRAPHY CHEST, ABDOMEN AND PELVIS TECHNIQUE: Multidetector CT imaging of the chest, abdomen and pelvis was performed using the standard protocol during bolus administration of intravenous contrast. Multiplanar CT image reconstructions and MIPs were obtained to evaluate the vascular anatomy. CONTRAST:  67m OMNIPAQUE IOHEXOL 350 MG/ML SOLN COMPARISON:  None. FINDINGS: CTA CHEST FINDINGS Mediastinum/Nodes: Noncontrast imaging through the thorax demonstrates no peripheral high attenuation within the thoracic aorta to suggest intramural hematoma formation. Post contrast-enhanced images demonstrate motion artifact limiting evaluation of the aortic root. The thoracic aorta is opacified with contrast material. No evidence for acute thoracic aortic dissection. The takeoff of the great vessels are patent. Visualized thyroid is unremarkable. No enlarged axillary, mediastinal or hilar lymphadenopathy. Heart is normal in size. No pericardial effusion. Aorta and main pulmonary artery normal in caliber. No evidence for central pulmonary embolism. Coronary arterial vascular calcifications. Lungs/Pleura: The central airways are patent. No consolidative pulmonary opacities. No pleural effusion or pneumothorax. Review of the MIP images confirms the above findings. CTA ABDOMEN FINDINGS Hepatobiliary: Liver is normal in size and contour. No focal hepatic lesion is identified. The gallbladder is unremarkable. No intrahepatic or extrahepatic biliary ductal dilatation. Pancreas: Unremarkable Spleen: Unremarkable Adrenals/Urinary Tract: The adrenal glands are normal. The kidneys enhance  symmetrically with contrast. Multiple bilateral low-attenuation subcentimeter renal lesions are demonstrated, too small to accurately characterize. There is circumferential wall thickening of the urinary bladder. Stomach/Bowel: No abnormal bowel wall thickening or evidence for bowel obstruction. No free fluid or free intraperitoneal air. No evidence for bowel obstruction. Vascular/Lymphatic: The abdominal aorta is patent and normal in caliber. There is fat stranding about the left renal artery with apparent focal areas of narrowing, demonstrated best on sagittal images (image 103; series 503). The branch vessels including the celiac, superior mesenteric and inferior mesenteric arteries are patent. The common iliac vessels as well as the internal and external iliac vessels are patent. Other: Prostate enlarged. Musculoskeletal: No aggressive or acute appearing osseous lesions. Review of the MIP images confirms the above findings. IMPRESSION: There is irregularity and focal narrowing of the left renal artery with with surrounding fat stranding. There is some motion artifact limiting evaluation of this location. Overall findings are nonspecific however the top differential considerations would be focal left renal artery dissection or vasculitis. Additional but less likely consideration would be left renal artery infection in the setting of pyelonephritis. On single phase contrast-enhanced exam, the left kidney appears perfused. Critical Value/emergent results were called by telephone at the time of interpretation on 03/12/2015 at 3:28 pm to Dr. WEulis Foster who verbally acknowledged these results. Electronically Signed   By: DLovey NewcomerM.D.   On: 03/12/2015 15:30     CBC  Recent Labs Lab 03/12/15 1335 03/12/15 1345 03/12/15 2115 03/13/15 0710 03/14/15 0412  WBC 10.9*  --  13.1* 14.5* 13.3*  HGB 15.5 17.0 13.6 13.1 13.5  HCT 44.9 50.0 39.9 38.5* 40.6  PLT 276  --  242 239 268  MCV 84.9  --  86.0 85.7 86.0  MCH  29.3  --  29.3 29.2 28.6  MCHC 34.5  --  34.1 34.0 33.3  RDW 12.1  --  12.1 12.3 12.4  LYMPHSABS 2.4  --   --   --   --   MONOABS 0.5  --   --   --   --   EOSABS 0.6  --   --   --   --   BASOSABS 0.0  --   --   --   --     Chemistries   Recent Labs Lab 03/12/15 1345 03/12/15 2115 03/13/15 0049 03/14/15 0412  NA 138  --  136 136  K 3.7  --  3.2* 3.6  CL 100*  --  100* 104  CO2  --   --  28 26  GLUCOSE 95  --  105* 105*  BUN 24*  --  17 11  CREATININE 1.80* 2.15* 1.91* 1.91*  CALCIUM  --   --  8.9 8.9  MG  --  2.0  --   --   AST  --  18 17 16   ALT  --  15* 14* 14*  ALKPHOS  --  48 47 40  BILITOT  --  0.7 0.7 0.9   ------------------------------------------------------------------------------------------------------------------ estimated creatinine clearance is 51.5 mL/min (by C-G formula based on Cr of 1.91). ------------------------------------------------------------------------------------------------------------------  Recent Labs  03/12/15 2115  HGBA1C 5.0   ------------------------------------------------------------------------------------------------------------------  Recent Labs  03/13/15 0049  CHOL 163  HDL 32*  LDLCALC 66  TRIG 325*  CHOLHDL 5.1   ------------------------------------------------------------------------------------------------------------------  Recent Labs  03/12/15 2115  TSH 1.597   ------------------------------------------------------------------------------------------------------------------ No results for input(s): VITAMINB12, FOLATE, FERRITIN, TIBC, IRON, RETICCTPCT in the last 72 hours.  Coagulation profile No results for input(s): INR, PROTIME in the last 168 hours.  No results for input(s): DDIMER in the last 72 hours.  Cardiac Enzymes  Recent Labs Lab 03/12/15 2115 03/13/15 0049 03/13/15 0710  TROPONINI <0.03 <0.03 <0.03    ------------------------------------------------------------------------------------------------------------------ Invalid input(s): POCBNP   CBG: No results for input(s): GLUCAP in the last 168 hours.     Studies: Ct Angio Chest Aorta W/cm &/or Wo/cm  03/12/2015  CLINICAL DATA:  Patient with back pain. Evaluate for aortic dissection. EXAM: CT ANGIOGRAPHY CHEST, ABDOMEN AND PELVIS TECHNIQUE: Multidetector CT imaging of the chest, abdomen and pelvis was performed using the standard protocol during bolus administration of intravenous contrast. Multiplanar CT image reconstructions and MIPs were obtained to evaluate the vascular anatomy. CONTRAST:  47m OMNIPAQUE IOHEXOL 350 MG/ML SOLN COMPARISON:  None. FINDINGS: CTA CHEST FINDINGS Mediastinum/Nodes: Noncontrast imaging through the thorax demonstrates no peripheral high attenuation within the thoracic aorta to suggest intramural hematoma formation. Post contrast-enhanced images demonstrate motion artifact limiting evaluation of the aortic root. The thoracic aorta is opacified with contrast material. No evidence for acute thoracic aortic dissection. The takeoff of the great vessels are patent. Visualized thyroid is unremarkable. No enlarged axillary, mediastinal or hilar lymphadenopathy. Heart is normal in size. No pericardial effusion. Aorta and main pulmonary artery normal in caliber. No evidence for central pulmonary embolism. Coronary arterial vascular calcifications. Lungs/Pleura: The central airways are patent. No consolidative pulmonary opacities. No pleural effusion or pneumothorax. Review of the MIP images confirms the above findings. CTA ABDOMEN FINDINGS Hepatobiliary: Liver is normal in size and contour. No focal hepatic lesion is identified. The gallbladder is unremarkable. No intrahepatic or extrahepatic biliary ductal dilatation. Pancreas: Unremarkable  Spleen: Unremarkable Adrenals/Urinary Tract: The adrenal glands are normal. The kidneys  enhance symmetrically with contrast. Multiple bilateral low-attenuation subcentimeter renal lesions are demonstrated, too small to accurately characterize. There is circumferential wall thickening of the urinary bladder. Stomach/Bowel: No abnormal bowel wall thickening or evidence for bowel obstruction. No free fluid or free intraperitoneal air. No evidence for bowel obstruction. Vascular/Lymphatic: The abdominal aorta is patent and normal in caliber. There is fat stranding about the left renal artery with apparent focal areas of narrowing, demonstrated best on sagittal images (image 103; series 503). The branch vessels including the celiac, superior mesenteric and inferior mesenteric arteries are patent. The common iliac vessels as well as the internal and external iliac vessels are patent. Other: Prostate enlarged. Musculoskeletal: No aggressive or acute appearing osseous lesions. Review of the MIP images confirms the above findings. IMPRESSION: There is irregularity and focal narrowing of the left renal artery with with surrounding fat stranding. There is some motion artifact limiting evaluation of this location. Overall findings are nonspecific however the top differential considerations would be focal left renal artery dissection or vasculitis. Additional but less likely consideration would be left renal artery infection in the setting of pyelonephritis. On single phase contrast-enhanced exam, the left kidney appears perfused. Critical Value/emergent results were called by telephone at the time of interpretation on 03/12/2015 at 3:28 pm to Dr. Eulis Foster, who verbally acknowledged these results. Electronically Signed   By: Lovey Newcomer M.D.   On: 03/12/2015 15:30   Ct Angio Abd/pel W/ And/or W/o  03/12/2015  CLINICAL DATA:  Patient with back pain. Evaluate for aortic dissection. EXAM: CT ANGIOGRAPHY CHEST, ABDOMEN AND PELVIS TECHNIQUE: Multidetector CT imaging of the chest, abdomen and pelvis was performed using  the standard protocol during bolus administration of intravenous contrast. Multiplanar CT image reconstructions and MIPs were obtained to evaluate the vascular anatomy. CONTRAST:  49m OMNIPAQUE IOHEXOL 350 MG/ML SOLN COMPARISON:  None. FINDINGS: CTA CHEST FINDINGS Mediastinum/Nodes: Noncontrast imaging through the thorax demonstrates no peripheral high attenuation within the thoracic aorta to suggest intramural hematoma formation. Post contrast-enhanced images demonstrate motion artifact limiting evaluation of the aortic root. The thoracic aorta is opacified with contrast material. No evidence for acute thoracic aortic dissection. The takeoff of the great vessels are patent. Visualized thyroid is unremarkable. No enlarged axillary, mediastinal or hilar lymphadenopathy. Heart is normal in size. No pericardial effusion. Aorta and main pulmonary artery normal in caliber. No evidence for central pulmonary embolism. Coronary arterial vascular calcifications. Lungs/Pleura: The central airways are patent. No consolidative pulmonary opacities. No pleural effusion or pneumothorax. Review of the MIP images confirms the above findings. CTA ABDOMEN FINDINGS Hepatobiliary: Liver is normal in size and contour. No focal hepatic lesion is identified. The gallbladder is unremarkable. No intrahepatic or extrahepatic biliary ductal dilatation. Pancreas: Unremarkable Spleen: Unremarkable Adrenals/Urinary Tract: The adrenal glands are normal. The kidneys enhance symmetrically with contrast. Multiple bilateral low-attenuation subcentimeter renal lesions are demonstrated, too small to accurately characterize. There is circumferential wall thickening of the urinary bladder. Stomach/Bowel: No abnormal bowel wall thickening or evidence for bowel obstruction. No free fluid or free intraperitoneal air. No evidence for bowel obstruction. Vascular/Lymphatic: The abdominal aorta is patent and normal in caliber. There is fat stranding about the  left renal artery with apparent focal areas of narrowing, demonstrated best on sagittal images (image 103; series 503). The branch vessels including the celiac, superior mesenteric and inferior mesenteric arteries are patent. The common iliac vessels as well as the internal and external  iliac vessels are patent. Other: Prostate enlarged. Musculoskeletal: No aggressive or acute appearing osseous lesions. Review of the MIP images confirms the above findings. IMPRESSION: There is irregularity and focal narrowing of the left renal artery with with surrounding fat stranding. There is some motion artifact limiting evaluation of this location. Overall findings are nonspecific however the top differential considerations would be focal left renal artery dissection or vasculitis. Additional but less likely consideration would be left renal artery infection in the setting of pyelonephritis. On single phase contrast-enhanced exam, the left kidney appears perfused. Critical Value/emergent results were called by telephone at the time of interpretation on 03/12/2015 at 3:28 pm to Dr. Eulis Foster, who verbally acknowledged these results. Electronically Signed   By: Lovey Newcomer M.D.   On: 03/12/2015 15:30      Lab Results  Component Value Date   HGBA1C 5.0 03/12/2015   Lab Results  Component Value Date   LDLCALC 66 03/13/2015   CREATININE 1.91* 03/14/2015       Scheduled Meds: . folic acid  1 mg Oral Daily  . hydrALAZINE  100 mg Oral 3 times per day  . labetalol  300 mg Oral BID  . mometasone-formoterol  2 puff Inhalation BID  . multivitamin with minerals  1 tablet Oral Daily  . omega-3 acid ethyl esters  1 g Oral BID  . sodium chloride  3 mL Intravenous Q12H  . spironolactone  50 mg Oral Daily  . tamsulosin  0.4 mg Oral QPC breakfast  . thiamine  100 mg Oral Daily   Continuous Infusions: . sodium chloride 75 mL/hr at 03/14/15 0832  . heparin 1,450 Units/hr (03/14/15 0352)    Active Problems:    Hypertensive urgency   Hypertensive urgency, malignant   Marijuana use    Time spent: 45 minutes   [facility] Pager [phone]. If 7PM-7AM, please contact night-coverage at [email or web address], password [facility] 03/14/2015, 10:59 AM  LOS: 2 days

## 2015-03-14 NOTE — Progress Notes (Addendum)
  [facility] Progress Note  Subjective  - Comfortable. Denies flank pain. Complains of difficulty voiding.    Objective Filed Vitals:   03/14/15 1615  BP: 188/129  Pulse: 92  Temp: 98.7 F (37.1 C)  Resp: 20    Intake/Output Summary (Last 24 hours) at 03/14/15 1654 Last data filed at 03/14/15 1002  Gross per 24 hour  Intake 1451.7 ml  Output   2400 ml  Net -948.3 ml   Lungs: Non labored breathing Cardiac: regular  Assessment/Planning: 44 y.o. male with hypertensive urgency.   Reviewed repeat CTA abd/pelv today with Dr. Scot Dock who is unable to determine  whether he has an acute left renal dissection versus chronic dissection versus long segment FMD.   Dr Trula Slade will be back tomorrow to final call on anticoagulation. Continue heparin for now.    Alvia Grove 03/14/2015 4:54 PM --  Laboratory CBC    Component Value Date/Time   WBC 13.3* 03/14/2015 0412   HGB 13.5 03/14/2015 0412   HCT 40.6 03/14/2015 0412   PLT 268 03/14/2015 0412    BMET    Component Value Date/Time   NA 136 03/14/2015 0412   K 3.6 03/14/2015 0412   CL 104 03/14/2015 0412   CO2 26 03/14/2015 0412   GLUCOSE 105* 03/14/2015 0412   BUN 11 03/14/2015 0412   CREATININE 1.91* 03/14/2015 0412   CALCIUM 8.9 03/14/2015 0412   GFRNONAA 40* 03/14/2015 0412   GFRAA 47* 03/14/2015 0412    COAG No results found for: INR, PROTIME No results found for: PTT  Antibiotics Anti-infectives    None       Virgina Jock, PA-C [facility] Office: [phone] Pager: [phone] 03/14/2015 4:54 PM     CT scan reviewed.  Unable to clearly identify a dissection, however this is still possible.  Other possible etiologies include vasculitis.  Recommend full anticoagulation for 3 months and then transition to anti-platelet therapy.  Would also send off vasculitis panel.  Annamarie Major

## 2015-03-14 NOTE — Consult Note (Signed)
Nephrology Consult Note  Patient name: Dylan Sweeney Medical record number: [id number] Date of birth: October 16, 1970 Age: 44 y.o. Gender: male  Primary Care Provider: No primary care provider on file.  Pt Overview and Major Events to Date:  11/8: Pt. 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 Blood pressure difficult to control, but coming down with Nitroglycerin drip. Evaluated by Vascular Surgery and placed on Heparin with plan for repeat imaging.  11/9: Pt. With ongoing elevated BP's, but better than admission.   Pt. Is a 44 y/o M with hx of HTN (on 4 BP meds at home), Asthma, CKD II (followed by nephrology in [facility] up to this point), BPH?. Presents with left sided back pain and malignant hypertension. 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 He says he had some back pain on Monday that improved with narcotic medication given to him by a family member. He says that he had a headache, with ongoing back pain, so he came in to the ED. No nausea, vomiting, fever, chills. No dysuria, hematuria, or urinary frequency. He has some mild abdominal pain. He has no chest pain. He has not had any numbness, or weakness. No acute changes in vision on admission, but did have some with his headache when the nitroglycerin drip was started. Does not smoke, does drink 2-3 beers / day. Strong family Hx of HTN with all of his siblings having hypertension requiring multiple medications. No hx of polycystic kidney disease, autoimmune disease, or renal artery stenosis in the past. No hx of any abdominal surgery.  Blood pressure has been reasonably controlled on nitro drip and hydralazine not too low.  He tells me his baseline creatinine has been 1.7 since his "30's"  S: No issues overnight. Pt. Does mention that he has some difficulty urinating in spite of the flomax. Says he is urinating frequently. Otherwise pain is much  improved.   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   Malignant HTN - Pt. With poorly controlled htn at baseline. On four medications at home. Claims compliance, says he is followed by [facility] nephrology provider in [facility] for this. Presents here with BP 200's / 150's and evidence of possible renal artery dissection. BP brought down with prn labetalol and nitro drip which has now been discontinued. BP's remain elevated.  - Nitro drip discontinued overnight. BP 176/ 117 in the room this am.  - Goal BP 150 - 160 / 90 - 100 - On Labetalol 200mg  BID consider titrating up, will increase to 300 BID - Continue Hydralazine 100mg  TID - Restart Aldactone, and consider adding Norvasc if he continues to have elevated BP's. And stop IVF as soon as testing over - Was requiring 4 meds for control before.  - Will see if a renal artery stenosis is contributing to this by CT today.  - Holding Cozaar - Trend BMET  AKI on CKD II - Pt. States that his baseline creatinine is 1.7. Cr - 1.8 on admission then up to 2 and now 1.9. He received Contrast for CTA. He says he follows with a nephrologist from the [place] in [facility]. Getting gentle fluids. Likely combination of HTN, and IV contrast.  - Continue NS at 75cc/hr.  Stop as soon as we can - Avoid dropping BP too low. Goals as above.  -  Avoid nephrotoxic agents. Holding ARB. No NSAID's for pain (pt. Says he was taking alleve at home).  - CTA today. Judicious contrast. NS afterward to help excrete contrast.  - Trend daily BMET.   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  - On Heparin - Repeat scan today with judicious contrast.  - Will follow along.   Asthma - no exacerbation. Mgmt per primary team.   BPH? -pt. Young for this, but does endorse urinary difficulty relieved by Flomax.    Disposition: Pending improvement of blood pressure and further evaluation for renal artery dissection.   Objective: Temp:  [98.2 F (36.8 C)-99 F (37.2 C)] 98.2 F (36.8 C) (11/10 0400) Pulse Rate:  [89-102] 94 (11/10 0700) Resp:  [15-22] 16 (11/10 0700) BP: (142-182)/(87-121) 172/121 mmHg (11/10 0700) SpO2:  [91 %-97 %] 93 % (11/10 0719) Physical Exam: General: NAD, AAOx3, resting in bed.  Cardiovascular: RRR, no MGR, normal S1/S2, 2+ distal pulses.  Respiratory: CTA Bilaterally, appropriate rate, unlabored.  Abdomen: S, NT, ND, No abdominal bruits audible. +BS.  Extremities: WWP, 2+ distal pulses, no edema, MAEW.   Laboratory:  Recent Labs Lab 03/12/15 2115 03/13/15 0710 03/14/15 0412  WBC 13.1* 14.5* 13.3*  HGB 13.6 13.1 13.5  HCT 39.9 38.5* 40.6  PLT 242 239 268    Recent Labs Lab 03/12/15 1345 03/12/15 2115 03/13/15 0049 03/14/15 0412  NA 138  --  136 136  K 3.7  --  3.2* 3.6  CL 100*  --  100* 104  CO2  --   --  28 26  BUN 24*  --  17 11  CREATININE 1.80* 2.15* 1.91* 1.91*  CALCIUM  --   --  8.9 8.9  PROT  --  7.0 6.4* 7.1  BILITOT  --  0.7 0.7 0.9  ALKPHOS  --  48 47 40  ALT  --  15* 14* 14*  AST  --  18 17 16   GLUCOSE 95  --  105* 105*    Echo > Moderate LVH, EF 60 -65%, G1 DD.    Imaging/Diagnostic Tests: IMPRESSION: There is irregularity and focal narrowing of the left renal artery with with surrounding fat stranding. There is some motion artifact limiting evaluation of this location. Overall findings are nonspecific however the top differential considerations would be focal left renal artery dissection or vasculitis. Additional but less likely consideration would be left renal artery infection in the setting of pyelonephritis. On single phase contrast-enhanced exam, the left kidney appears perfused.  Critical Value/emergent results were called by telephone at the time of interpretation on 03/12/2015 at 3:28 pm to Dr. Eulis Foster, who  verbally acknowledged these results.   Electronically Signed  By: Lovey Newcomer M.D.  On: 03/12/2015 15:30  Aquilla Hacker, MD 03/14/2015, 8:04 AM PGY-2  Patient seen and examined, agree with above note with above modifications. Pt doing well.  In terms of BP will increase labetalol and add back aldactone and stop IVF when we can.  Repeat CTA of renal artery pending for today to determine plan Corliss Parish, MD 03/14/2015

## 2015-03-15 ENCOUNTER — Other Ambulatory Visit: Payer: Self-pay | Admitting: *Deleted

## 2015-03-15 ENCOUNTER — Inpatient Hospital Stay (HOSPITAL_COMMUNITY): Payer: Non-veteran care

## 2015-03-15 DIAGNOSIS — I7773 Dissection of renal artery: Secondary | ICD-10-CM

## 2015-03-15 LAB — PROTIME-INR
INR: 1.17 (ref 0.00–1.49)
Prothrombin Time: 15 seconds (ref 11.6–15.2)

## 2015-03-15 LAB — COMPREHENSIVE METABOLIC PANEL
ALT: 14 U/L — AB (ref 17–63)
AST: 17 U/L (ref 15–41)
Albumin: 3.5 g/dL (ref 3.5–5.0)
Alkaline Phosphatase: 41 U/L (ref 38–126)
Anion gap: 13 (ref 5–15)
BUN: 13 mg/dL (ref 6–20)
CHLORIDE: 101 mmol/L (ref 101–111)
CO2: 23 mmol/L (ref 22–32)
CREATININE: 2.18 mg/dL — AB (ref 0.61–1.24)
Calcium: 8.8 mg/dL — ABNORMAL LOW (ref 8.9–10.3)
GFR calc non Af Amer: 35 mL/min — ABNORMAL LOW (ref >60)
GFR, EST AFRICAN AMERICAN: 40 mL/min — AB (ref >60)
Glucose, Bld: 108 mg/dL — ABNORMAL HIGH (ref 65–99)
Potassium: 3.5 mmol/L (ref 3.5–5.1)
SODIUM: 137 mmol/L (ref 135–145)
Total Bilirubin: 0.8 mg/dL (ref 0.3–1.2)
Total Protein: 6.8 g/dL (ref 6.5–8.1)

## 2015-03-15 LAB — URINE MICROSCOPIC-ADD ON

## 2015-03-15 LAB — CBC
HCT: 39.4 % (ref 39.0–52.0)
Hemoglobin: 13.2 g/dL (ref 13.0–17.0)
MCH: 28.9 pg (ref 26.0–34.0)
MCHC: 33.5 g/dL (ref 30.0–36.0)
MCV: 86.2 fL (ref 78.0–100.0)
PLATELETS: 271 10*3/uL (ref 150–400)
RBC: 4.57 MIL/uL (ref 4.22–5.81)
RDW: 12.6 % (ref 11.5–15.5)
WBC: 13 10*3/uL — AB (ref 4.0–10.5)

## 2015-03-15 LAB — URINALYSIS, ROUTINE W REFLEX MICROSCOPIC
BILIRUBIN URINE: NEGATIVE
Glucose, UA: NEGATIVE mg/dL
Hgb urine dipstick: NEGATIVE
KETONES UR: NEGATIVE mg/dL
LEUKOCYTES UA: NEGATIVE
NITRITE: NEGATIVE
PROTEIN: 100 mg/dL — AB
Specific Gravity, Urine: 1.014 (ref 1.005–1.030)
UROBILINOGEN UA: 1 mg/dL (ref 0.0–1.0)
pH: 6 (ref 5.0–8.0)

## 2015-03-15 LAB — HEPARIN LEVEL (UNFRACTIONATED)
Heparin Unfractionated: 0.36 IU/mL (ref 0.30–0.70)
Heparin Unfractionated: 0.3 IU/mL (ref 0.30–0.70)

## 2015-03-15 MED ORDER — TAMSULOSIN HCL 0.4 MG PO CAPS
0.8000 mg | ORAL_CAPSULE | Freq: Every day | ORAL | Status: DC
  Administered 2015-03-15 – 2015-03-19 (×5): 0.8 mg via ORAL
  Filled 2015-03-15 (×5): qty 2

## 2015-03-15 MED ORDER — PATIENT'S GUIDE TO USING COUMADIN BOOK
Freq: Once | Status: DC
  Filled 2015-03-15: qty 1

## 2015-03-15 MED ORDER — LABETALOL HCL 200 MG PO TABS
400.0000 mg | ORAL_TABLET | Freq: Two times a day (BID) | ORAL | Status: DC
  Administered 2015-03-15 – 2015-03-19 (×9): 400 mg via ORAL
  Filled 2015-03-15 (×9): qty 2

## 2015-03-15 MED ORDER — WARFARIN SODIUM 5 MG PO TABS
7.5000 mg | ORAL_TABLET | Freq: Once | ORAL | Status: AC
  Administered 2015-03-15: 7.5 mg via ORAL
  Filled 2015-03-15: qty 2

## 2015-03-15 MED ORDER — WARFARIN - PHARMACIST DOSING INPATIENT: Freq: Every day | Status: DC

## 2015-03-15 MED ORDER — LABETALOL HCL 5 MG/ML IV SOLN
5.0000 mg | INTRAVENOUS | Status: DC | PRN
  Administered 2015-03-15 – 2015-03-16 (×3): 5 mg via INTRAVENOUS
  Filled 2015-03-15 (×4): qty 4

## 2015-03-15 MED ORDER — WARFARIN VIDEO: Freq: Once | Status: DC

## 2015-03-15 MED ORDER — SPIRONOLACTONE 25 MG PO TABS
50.0000 mg | ORAL_TABLET | Freq: Two times a day (BID) | ORAL | Status: DC
  Administered 2015-03-15 – 2015-03-19 (×8): 50 mg via ORAL
  Filled 2015-03-15: qty 1
  Filled 2015-03-15: qty 2
  Filled 2015-03-15: qty 1
  Filled 2015-03-15 (×2): qty 2
  Filled 2015-03-15: qty 1
  Filled 2015-03-15 (×3): qty 2
  Filled 2015-03-15: qty 1

## 2015-03-15 NOTE — Progress Notes (Signed)
    Subjective  -   Had been feeling better, however left flank pain returned last night as his blood pressure went up to 180.   Physical Exam:  Pulmonary: Nonlabored respirations Cardiovascular: Regular rhythm Abdomen soft       Assessment/Plan:    I suspect that the renal pathology seen on CT scan is a dissection however a spontaneous dissection is uncommon, unless there is an underlying condition such as fibromuscular dysplasia.  CT scan findings raised the possibility that this could be a vasculitis, however a vasculitis limited to a single renal vessel was also very uncommon.  I think it would be reasonable to send additional laboratory markers in addition to the sedimentation rate which was normal, to rule this out.  At this point, I would recommend anticoagulation with Coumadin for 3 months.  After the 3 month.,  I would consider switching to aspirin or Plavix.  He will need aggressive blood pressure control.  His pain appears to be correlated with his hypertension.  I would try to manage this medically.  Stenting in the acute setting, is associated with an increased complication rate.  I will schedule the patient for follow-up in 3 months with a repeat CT scan  Ahmaad Neidhardt, Rock Nephew 03/15/2015 12:58 PM --  Danley Danker Vitals:   03/15/15 1200  BP: 147/101  Pulse: 87  Temp: 98.5 F (36.9 C)  Resp: 13    Intake/Output Summary (Last 24 hours) at 03/15/15 1258 Last data filed at 03/15/15 0854  Gross per 24 hour  Intake      0 ml  Output    775 ml  Net   -775 ml     Laboratory CBC    Component Value Date/Time   WBC 13.0* 03/15/2015 0239   HGB 13.2 03/15/2015 0239   HCT 39.4 03/15/2015 0239   PLT 271 03/15/2015 0239    BMET    Component Value Date/Time   NA 137 03/15/2015 0239   K 3.5 03/15/2015 0239   CL 101 03/15/2015 0239   CO2 23 03/15/2015 0239   GLUCOSE 108* 03/15/2015 0239   BUN 13 03/15/2015 0239   CREATININE 2.18* 03/15/2015 0239   CALCIUM 8.8*  03/15/2015 0239   GFRNONAA 35* 03/15/2015 0239   GFRAA 40* 03/15/2015 0239    COAG No results found for: INR, PROTIME No results found for: PTT  Antibiotics Anti-infectives    None       V. Leia Alf, M.D. [facility] Office: [phone] Pager:  [phone]

## 2015-03-15 NOTE — Progress Notes (Signed)
ANTICOAGULATION CONSULT NOTE - Follow Up Consult  Pharmacy Consult for Heparin  Indication: Renal artery dissection  Allergies  Allergen Reactions  . Other Other (See Comments)    G6 pill allergy: pill given before travel in [place]    Patient Measurements: Height: 5\' 11"  (180.3 cm) Weight: 187 lb 13.3 oz (85.2 kg) IBW/kg (Calculated) : 75.3  Vital Signs: Temp: 98.7 F (37.1 C) (11/10 2000) Temp Source: Oral (11/10 2000) BP: 177/114 mmHg (11/10 2000) Pulse Rate: 100 (11/10 2000)  Labs:  Recent Labs  03/12/15 2115 03/13/15 0049 03/13/15 0710  03/14/15 0412 03/14/15 1546 03/15/15 0042  HGB 13.6  --  13.1  --  13.5  --   --   HCT 39.9  --  38.5*  --  40.6  --   --   PLT 242  --  239  --  268  --   --   HEPARINUNFRC  --  0.13*  --   < > 0.27* 0.12* 0.36  CREATININE 2.15* 1.91*  --   --  1.91*  --   --   TROPONINI <0.03 <0.03 <0.03  --   --   --   --   < > = values in this interval not displayed.  Estimated Creatinine Clearance: 51.5 mL/min (by C-G formula based on Cr of 1.91).   Assessment: Therapeutic heparin level x 1 after rate increase  Goal of Therapy:  Heparin level 0.3-0.7 units/ml Monitor platelets by anticoagulation protocol: Yes   Plan:  -Continue heparin at 1750 units/hr -HL with AM labs  Narda Bonds 03/15/2015,1:33 AM

## 2015-03-15 NOTE — Progress Notes (Addendum)
[facility] PROGRESS NOTE  Dylan Sweeney IRC:[id number] DOB: 11-18-1970 DOA: 03/12/2015 PCP: No primary care provider on file.  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 He states that he has been compliant with his medications. CT imaging concerning for focal narrowing of the left renal artery, concern for focal left renal artery dissection vs vasculitis.  Still requiring PRN labetalol. BP 168 / 92 in the room this am. 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 Unable to clearly identify a dissection, however this is still possible. needs  full anticoagulation for 3 months and then transition to anti-platelet therapy Continue stepdown Continue anticoagulation with heparin drip, duration of anticoagulation   specified by vascular surgery as above  Interventional radiology, Dr. TrDaryll Brodas also notified about the patient's presentation Patient may have underlying fibromuscular dysplasia or focal renal artery stenosis Nephrology recommends renal ultrasound to rule out obstruction in the setting of complaints of pelvic pain and inability to urinate Recheck UA  .AKI on CKD II - Pt. States that his  baseline creatinine is 1.7. Cr - 1.8 on admission Cr up slightly again today to 2.18 with Contrast yesterday. Should hopefully come down with clearance of contrast and BP control. He received Contrast for CTA. [facility]  normal saline at 75 mL per hour,   Likely combination of HTN, and IV contrast.   Marijuana use-counseling done  Asthma-continue Advair, albuterol, stable  Alcohol abuse patient continue on CIWA protocol  BPH? Increased Flomax.   DVT prophylaxsis heparin drip  Code Status:      Code Status Orders        Start     Ordered   03/12/15 1917  Full code   Continuous     03/12/15 1918     Family Communication: family updated about patient's clinical progress Disposition Plan:  Continue stepdown, not stable for discharge, plan as above   Brief narrative: [age]ear old male with a history of chronic kidney disease stage [facility] which is under control, presents to the ER with left flank pain. Blood pressure upon arrival was 222/148. Flank pain is described as dull, constant pain, not particularly worse with movement, not reproducible to palpation. Denies any history of nephrolithiasis. Patient received 5 doses of IV labetalol, subsequently started on a nitroglycerin drip. CT scan which was performed to rule out dissection showed an irregularity and focal narrowing of the left renal artery with surrounding fat stranding. Findings nonspecific but ddx include renal artery dissection or vasculitis. Patient has a history of sickle cell trait, admits to using marijuana, denies cocaine use, drinks beer on a daily basis.    Consultants:  Nephrology  Vascular surgery  Procedures:  None none  Antibiotics: Anti-infectives    None         HPI/Subjective: Complaining of left flank pain and pelvic pain, still has difficulty  urinating   Objective: Filed Vitals:   03/15/15 0600 03/15/15 0700 03/15/15 0722 03/15/15 0750  BP: 169/124 183/125  175/124  Pulse:    87  Temp:    97.2 F (36.2 C)  TempSrc:    Axillary  Resp: _0 Height:      Weight:      SpO2:   94%     Intake/Output Summary (Last 24 hours) at 03/15/15 1027 Last data filed at 03/15/15 0854  Gross per 24 hour  Intake      0 ml  Output    775 ml  Net   -775 ml    Exam:  General: NAD, AAOx3, resting in bed.  Cardiovascular: RRR, no MGR, normal S1/S2, 2+ distal pulses.  Respiratory: CTA Bilaterally, appropriate rate, unlabored.  Abdomen: S, NT, ND, No abdominal bruits audible. +BS.  Extremities: WWP, 2+ distal pulses, no edema, MAEW.  Data Review   Micro Results Recent Results (from the past 240 hour(s))  MRSA PCR Screening     Status: None   Collection Time: 03/12/15 10:47 PM  Result Value Ref Range Status   MRSA by PCR NEGATIVE NEGATIVE Final    Comment:        The GeneXpert MRSA Assay (FDA approved for NASAL specimens only), is one component of a comprehensive MRSA colonization surveillance program. It is not intended to diagnose MRSA infection nor to guide or monitor treatment for MRSA infections.     Radiology Reports Ct Angio Chest Aorta W/cm &/or Wo/cm  03/12/2015  CLINICAL DATA:  Patient with back pain. Evaluate for aortic dissection. EXAM: CT ANGIOGRAPHY CHEST, ABDOMEN AND PELVIS TECHNIQUE: Multidetector CT imaging of the chest, abdomen and pelvis was performed using the standard protocol during bolus administration of intravenous contrast. Multiplanar CT image reconstructions and MIPs were obtained to evaluate the vascular anatomy. CONTRAST:  61m OMNIPAQUE IOHEXOL 350 MG/ML SOLN COMPARISON:  None. FINDINGS: CTA CHEST FINDINGS Mediastinum/Nodes: Noncontrast imaging through the thorax demonstrates no peripheral high attenuation within the thoracic aorta to suggest intramural hematoma formation. Post  contrast-enhanced images demonstrate motion artifact limiting evaluation of the aortic root. The thoracic aorta is opacified with contrast material. No evidence for acute thoracic aortic dissection. The takeoff of the great vessels are patent. Visualized thyroid is unremarkable. No enlarged axillary, mediastinal or hilar lymphadenopathy. Heart is normal in size. No pericardial effusion. Aorta and main pulmonary artery normal in caliber. No evidence for central pulmonary embolism. Coronary arterial vascular calcifications. Lungs/Pleura: The central airways are patent. No consolidative pulmonary opacities. No pleural effusion or pneumothorax. Review of the MIP images confirms the above findings. CTA ABDOMEN FINDINGS Hepatobiliary: Liver is normal in size and contour. No focal hepatic lesion is identified. The gallbladder is unremarkable. No intrahepatic or extrahepatic biliary ductal dilatation. Pancreas: Unremarkable Spleen: Unremarkable Adrenals/Urinary Tract: The adrenal glands are normal. The kidneys enhance symmetrically with contrast. Multiple bilateral low-attenuation subcentimeter renal lesions are demonstrated, too small to accurately characterize. There is circumferential wall thickening of the urinary bladder. Stomach/Bowel: No abnormal bowel wall thickening or evidence for bowel obstruction. No free fluid or free intraperitoneal air. No evidence for bowel obstruction. Vascular/Lymphatic: The abdominal aorta is patent and normal in caliber. There is fat stranding about the left renal artery with apparent focal areas  of narrowing, demonstrated best on sagittal images (image 103; series 503). The branch vessels including the celiac, superior mesenteric and inferior mesenteric arteries are patent. The common iliac vessels as well as the internal and external iliac vessels are patent. Other: Prostate enlarged. Musculoskeletal: No aggressive or acute appearing osseous lesions. Review of the MIP images confirms  the above findings. IMPRESSION: There is irregularity and focal narrowing of the left renal artery with with surrounding fat stranding. There is some motion artifact limiting evaluation of this location. Overall findings are nonspecific however the top differential considerations would be focal left renal artery dissection or vasculitis. Additional but less likely consideration would be left renal artery infection in the setting of pyelonephritis. On single phase contrast-enhanced exam, the left kidney appears perfused. Critical Value/emergent results were called by telephone at the time of interpretation on 03/12/2015 at 3:28 pm to Dr. Eulis Foster, who verbally acknowledged these results. Electronically Signed   By: Lovey Newcomer M.D.   On: 03/12/2015 15:30   Ct Angio Abd/pel W/ And/or W/o  03/14/2015  CLINICAL DATA:  RULE OUT DISSECTION, PREVIOUS SCAN SUBOPTIMAL DUE TO MOTION ARTIFACT AND POOR CONTRAST BOLUS ENHANCEMENT CONCERN FOR LEFT RENAL ARTERY PATHOLOGY LOWER LEFT SIDE BACK PAIN SINCE Friday LAST WEEK : CT ANGIOGRAPHY ABDOMEN AND PELVIS TECHNIQUE: Multidetector CT imaging of the abdomen and pelvis was performed using the standard protocol during bolus administration of intravenous contrast. Multiplanar reconstructed images including MIPs were obtained and reviewed to evaluate the vascular anatomy. CONTRAST:  54m OMNIPAQUE IOHEXOL 350 MG/ML SOLN, reduced dose due to renal insufficiency COMPARISON:  COMPARISON 03/12/2015 FINDINGS: ARTERIAL FINDINGS: Aorta:                Normal Celiac axis:          Patent Superior mesenteric:  Patent, classic distal branch anatomy. Left renal: There are 3. In the superior left renal artery, there is Circumferential wall thickening and luminal narrowing through most of the artery, with a short segment of relative sparing Extending over length of less than 1 cm in the midportion. The circumferential wall thickening and luminal narrowing extends through the bifurcation to involve  posterior and to a lesser degree anterior divisions. There are mild regional inflammatory changes. The more diminutive middle left renal artery is unremarkable. The inferior left renal artery is aberrant, arising from the aorta below the level of the IMA, widely patent and unremarkable. Right renal:          Single, unremarkable. Inferior mesenteric:  Patent Left iliac:           Unremarkable Right iliac:          Unremarkable Venous findings: Dedicated venous phase imaging not obtained. Circumaortic left renal vein is noted. Review of the MIP images confirms the above findings. Nonvascular findings: Platelike atelectasis or scarring in the lateral basal segment right lower lobe. Unremarkable arterial phase evaluation of liver, nondilated gallbladder, spleen, adrenal glands, pancreas. Probable cyst in the lower pole left kidney as before, incompletely characterized. Stomach, small bowel, and colon are nondilated. Normal appendix. A few scattered descending and sigmoid diverticula without adjacent inflammatory/ edematous change. Urinary bladder is somewhat thick walled, physiologically distended. Moderate prostatic enlargement. No ascites. No free air. IMPRESSION: 1. Long segment wall thickening and luminal narrowing in the superior left renal artery extending into anterior posterior divisions. The findings are nonspecific and may represent atypical changes of fibromuscular dysplasia, large vessel arteritis (without any other evident areas of involvement), possibly with superimposed dissection.  2. Widely patent middle and aberrant inferior left renal arteries perfuse a significant portion of the left renal parenchyma. Electronically Signed   By: Lucrezia Europe M.D.   On: 03/14/2015 13:29   Ct Angio Abd/pel W/ And/or W/o  03/12/2015  CLINICAL DATA:  Patient with back pain. Evaluate for aortic dissection. EXAM: CT ANGIOGRAPHY CHEST, ABDOMEN AND PELVIS TECHNIQUE: Multidetector CT imaging of the chest, abdomen and pelvis  was performed using the standard protocol during bolus administration of intravenous contrast. Multiplanar CT image reconstructions and MIPs were obtained to evaluate the vascular anatomy. CONTRAST:  48m OMNIPAQUE IOHEXOL 350 MG/ML SOLN COMPARISON:  None. FINDINGS: CTA CHEST FINDINGS Mediastinum/Nodes: Noncontrast imaging through the thorax demonstrates no peripheral high attenuation within the thoracic aorta to suggest intramural hematoma formation. Post contrast-enhanced images demonstrate motion artifact limiting evaluation of the aortic root. The thoracic aorta is opacified with contrast material. No evidence for acute thoracic aortic dissection. The takeoff of the great vessels are patent. Visualized thyroid is unremarkable. No enlarged axillary, mediastinal or hilar lymphadenopathy. Heart is normal in size. No pericardial effusion. Aorta and main pulmonary artery normal in caliber. No evidence for central pulmonary embolism. Coronary arterial vascular calcifications. Lungs/Pleura: The central airways are patent. No consolidative pulmonary opacities. No pleural effusion or pneumothorax. Review of the MIP images confirms the above findings. CTA ABDOMEN FINDINGS Hepatobiliary: Liver is normal in size and contour. No focal hepatic lesion is identified. The gallbladder is unremarkable. No intrahepatic or extrahepatic biliary ductal dilatation. Pancreas: Unremarkable Spleen: Unremarkable Adrenals/Urinary Tract: The adrenal glands are normal. The kidneys enhance symmetrically with contrast. Multiple bilateral low-attenuation subcentimeter renal lesions are demonstrated, too small to accurately characterize. There is circumferential wall thickening of the urinary bladder. Stomach/Bowel: No abnormal bowel wall thickening or evidence for bowel obstruction. No free fluid or free intraperitoneal air. No evidence for bowel obstruction. Vascular/Lymphatic: The abdominal aorta is patent and normal in caliber. There is fat  stranding about the left renal artery with apparent focal areas of narrowing, demonstrated best on sagittal images (image 103; series 503). The branch vessels including the celiac, superior mesenteric and inferior mesenteric arteries are patent. The common iliac vessels as well as the internal and external iliac vessels are patent. Other: Prostate enlarged. Musculoskeletal: No aggressive or acute appearing osseous lesions. Review of the MIP images confirms the above findings. IMPRESSION: There is irregularity and focal narrowing of the left renal artery with with surrounding fat stranding. There is some motion artifact limiting evaluation of this location. Overall findings are nonspecific however the top differential considerations would be focal left renal artery dissection or vasculitis. Additional but less likely consideration would be left renal artery infection in the setting of pyelonephritis. On single phase contrast-enhanced exam, the left kidney appears perfused. Critical Value/emergent results were called by telephone at the time of interpretation on 03/12/2015 at 3:28 pm to Dr. WEulis Foster who verbally acknowledged these results. Electronically Signed   By: DLovey NewcomerM.D.   On: 03/12/2015 15:30     CBC  Recent Labs Lab 03/12/15 1335 03/12/15 1345 03/12/15 2115 03/13/15 0710 03/14/15 0412 03/15/15 0239  WBC 10.9*  --  13.1* 14.5* 13.3* 13.0*  HGB 15.5 17.0 13.6 13.1 13.5 13.2  HCT 44.9 50.0 39.9 38.5* 40.6 39.4  PLT 276  --  242 239 268 271  MCV 84.9  --  86.0 85.7 86.0 86.2  MCH 29.3  --  29.3 29.2 28.6 28.9  MCHC 34.5  --  34.1 34.0 33.3 33.5  RDW 12.1  --  12.1 12.3 12.4 12.6  LYMPHSABS 2.4  --   --   --   --   --   MONOABS 0.5  --   --   --   --   --   EOSABS 0.6  --   --   --   --   --   BASOSABS 0.0  --   --   --   --   --     Chemistries   Recent Labs Lab 03/12/15 1345 03/12/15 2115 03/13/15 0049 03/14/15 0412 03/15/15 0239  NA 138  --  136 136 137  K 3.7  --  3.2*  3.6 3.5  CL 100*  --  100* 104 101  CO2  --   --  _0 GLUCOSE 95  --  105* 105* 108*  BUN 24*  --  _1 CREATININE 1.80* 2.15* 1.91* 1.91* 2.18*  CALCIUM  --   --  8.9 8.9 8.8*  MG  --  2.0  --   --   --   AST  --  _2 ALT  --  15* 14* 14* 14*  ALKPHOS  --  48 47 40 41  BILITOT  --  0.7 0.7 0.9 0.8   ------------------------------------------------------------------------------------------------------------------ estimated creatinine clearance is 45.1 mL/min (by C-G formula based on Cr of 2.18). ------------------------------------------------------------------------------------------------------------------  Recent Labs  03/12/15 2115  HGBA1C 5.0   ------------------------------------------------------------------------------------------------------------------  Recent Labs  03/13/15 0049  CHOL 163  HDL 32*  LDLCALC 66  TRIG 325*  CHOLHDL 5.1   ------------------------------------------------------------------------------------------------------------------  Recent Labs  03/12/15 2115  TSH 1.597   ------------------------------------------------------------------------------------------------------------------ No results for input(s): VITAMINB12, FOLATE, FERRITIN, TIBC, IRON, RETICCTPCT in the last 72 hours.  Coagulation profile No results for input(s): INR, PROTIME in the last 168 hours.  No results for input(s): DDIMER in the last 72 hours.  Cardiac Enzymes  Recent Labs Lab 03/12/15 2115 03/13/15 0049 03/13/15 0710  TROPONINI <0.03 <0.03 <0.03   ------------------------------------------------------------------------------------------------------------------ Invalid input(s): POCBNP   CBG: No results for input(s): GLUCAP in the last 168 hours.     Studies: Ct Angio Abd/pel W/ And/or W/o  03/14/2015  CLINICAL DATA:  RULE OUT DISSECTION, PREVIOUS SCAN SUBOPTIMAL DUE TO MOTION ARTIFACT AND POOR CONTRAST BOLUS ENHANCEMENT  CONCERN FOR LEFT RENAL ARTERY PATHOLOGY LOWER LEFT SIDE BACK PAIN SINCE Friday LAST WEEK : CT ANGIOGRAPHY ABDOMEN AND PELVIS TECHNIQUE: Multidetector CT imaging of the abdomen and pelvis was performed using the standard protocol during bolus administration of intravenous contrast. Multiplanar reconstructed images including MIPs were obtained and reviewed to evaluate the vascular anatomy. CONTRAST:  39m OMNIPAQUE IOHEXOL 350 MG/ML SOLN, reduced dose due to renal insufficiency COMPARISON:  COMPARISON 03/12/2015 FINDINGS: ARTERIAL FINDINGS: Aorta:                Normal Celiac axis:          Patent Superior mesenteric:  Patent, classic distal branch anatomy. Left renal: There are 3. In the superior left renal artery, there is Circumferential wall thickening and luminal narrowing through most of the artery, with a short segment of relative sparing Extending over length of less than 1 cm in the midportion. The circumferential wall thickening and luminal narrowing extends through the bifurcation to involve posterior and to a lesser degree anterior divisions. There are mild regional inflammatory changes. The more diminutive middle left renal artery is unremarkable. The inferior left renal artery is  aberrant, arising from the aorta below the level of the IMA, widely patent and unremarkable. Right renal:          Single, unremarkable. Inferior mesenteric:  Patent Left iliac:           Unremarkable Right iliac:          Unremarkable Venous findings: Dedicated venous phase imaging not obtained. Circumaortic left renal vein is noted. Review of the MIP images confirms the above findings. Nonvascular findings: Platelike atelectasis or scarring in the lateral basal segment right lower lobe. Unremarkable arterial phase evaluation of liver, nondilated gallbladder, spleen, adrenal glands, pancreas. Probable cyst in the lower pole left kidney as before, incompletely characterized. Stomach, small bowel, and colon are nondilated. Normal  appendix. A few scattered descending and sigmoid diverticula without adjacent inflammatory/ edematous change. Urinary bladder is somewhat thick walled, physiologically distended. Moderate prostatic enlargement. No ascites. No free air. IMPRESSION: 1. Long segment wall thickening and luminal narrowing in the superior left renal artery extending into anterior posterior divisions. The findings are nonspecific and may represent atypical changes of fibromuscular dysplasia, large vessel arteritis (without any other evident areas of involvement), possibly with superimposed dissection. 2. Widely patent middle and aberrant inferior left renal arteries perfuse a significant portion of the left renal parenchyma. Electronically Signed   By: Lucrezia Europe M.D.   On: 03/14/2015 13:29      Lab Results  Component Value Date   HGBA1C 5.0 03/12/2015   Lab Results  Component Value Date   LDLCALC 66 03/13/2015   CREATININE 2.18* 03/15/2015       Scheduled Meds: . folic acid  1 mg Oral Daily  . hydrALAZINE  100 mg Oral 3 times per day  . labetalol  400 mg Oral BID  . mometasone-formoterol  2 puff Inhalation BID  . multivitamin with minerals  1 tablet Oral Daily  . omega-3 acid ethyl esters  1 g Oral BID  . sodium chloride  3 mL Intravenous Q12H  . spironolactone  50 mg Oral BID  . tamsulosin  0.8 mg Oral QPC breakfast  . thiamine  100 mg Oral Daily   Continuous Infusions: . heparin 1,750 Units/hr (03/14/15 2232)    Active Problems:   Hypertensive urgency   Hypertensive urgency, malignant   Marijuana use    Time spent: 45 minutes   [address] [facility] Pager [phone]. If 7PM-7AM, please contact night-coverage at [email or web address], password [facility] 03/15/2015, 10:27 AM  LOS: 3 days

## 2015-03-15 NOTE — Progress Notes (Signed)
ANTICOAGULATION CONSULT NOTE - Follow up [facility] for Heparin Indication: Renal artery dissection  Allergies  Allergen Reactions  . Other Other (See Comments)    G6 pill allergy: pill given before travel in [place]    Patient Measurements: Height: 5\' 11"  (180.3 cm) Weight: 187 lb 13.3 oz (85.2 kg) IBW/kg (Calculated) : 75.3  Heparin Wt: 85.2 kg   Vital Signs: Temp: 98.5 F (36.9 C) (11/11 1200) Temp Source: Oral (11/11 1200) BP: 147/101 mmHg (11/11 1200) Pulse Rate: 87 (11/11 1200)  Labs:  Recent Labs  03/12/15 2115 03/13/15 0049 03/13/15 0710  03/14/15 0412 03/14/15 1546 03/15/15 0042 03/15/15 0239  HGB 13.6  --  13.1  --  13.5  --   --  13.2  HCT 39.9  --  38.5*  --  40.6  --   --  39.4  PLT 242  --  239  --  268  --   --  271  HEPARINUNFRC  --  0.13*  --   < > 0.27* 0.12* 0.36 0.30  CREATININE 2.15* 1.91*  --   --  1.91*  --   --  2.18*  TROPONINI <0.03 <0.03 <0.03  --   --   --   --   --   < > = values in this interval not displayed.  Estimated Creatinine Clearance: 45.1 mL/min (by C-G formula based on Cr of 2.18).   Medical History: Past Medical History  Diagnosis Date  . Hypertension   . Renal disorder   . Asthma   . Renal insufficiency   . Sickle cell anemia (HCC)     Has the trait-per patient (03/12/15)    Assessment: 40 YOM with hx CKD II and HTN who presented to the [facility] on 11/8 with left flank pain.  Abdominal CT showed possible renal artery dissection and pharmacy was consulted to start heparin for anticoagulation.  The patient's heparin level this morning remains therapeutic (HL 0.3 << 0.36, goal of 0.3-0.7). CBC stable - no s/sx of bleeding noted.  Will increase slightly to keep w/in range.   Goal of Therapy:  Heparin level 0.3-0.7 units/ml Monitor platelets by anticoagulation protocol: Yes   Plan:  1. Increase heparin drip to 1800 units/hr (18 ml/hr) 2. Will continue to monitor for any signs/symptoms of bleeding and  will follow up with heparin level in the a.m.    Alycia Rossetti, PharmD, BCPS Clinical Pharmacist Pager: [phone] 03/15/2015 1:34 PM

## 2015-03-15 NOTE — Progress Notes (Signed)
Pt has been complaining of the sharp lower flank pain that he was complaining of earlier in the week. He is concerned because he has not had this pain since what he stated was Tuesday night. He stated that it started when he was straining to pee and has not been relieved well with 0.5 of Dilaudid PRN.

## 2015-03-15 NOTE — Consult Note (Signed)
Nephrology Consult Note  Patient name: Dylan Sweeney Medical record number: [id number] Date of birth: 07/11/1970 Age: 44 y.o. Gender: male  Primary Care Provider: No primary care provider on file.  Pt Overview and Major Events to Date:  11/8: Pt. 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 Blood pressure difficult to control, but coming down with Nitroglycerin drip. Evaluated by Vascular Surgery and placed on Heparin with plan for repeat imaging.  11/9: Pt. With ongoing elevated BP's, but better than admission.   Pt. Is a 44 y/o M with hx of HTN (on 4 BP meds at home), Asthma, CKD II (followed by nephrology in [facility] up to this point), BPH?. Presents with left sided back pain and malignant hypertension. 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 He says he had some back pain on Monday that improved with narcotic medication given to him by a family member. He says that he had a headache, with ongoing back pain, so he came in to the ED. No nausea, vomiting, fever, chills. No dysuria, hematuria, or urinary frequency. He has some mild abdominal pain. He has no chest pain. He has not had any numbness, or weakness. No acute changes in vision on admission, but did have some with his headache when the nitroglycerin drip was started. Does not smoke, does drink 2-3 beers / day. Strong family Hx of HTN with all of his siblings having hypertension requiring multiple medications. No hx of polycystic kidney disease, autoimmune disease, or renal artery stenosis in the past. No hx of any abdominal surgery.  Blood pressure has been reasonably controlled on nitro drip and hydralazine not too low.  He tells me his baseline creatinine has been 1.7 since his "30's"  S: No issues overnight. Continued difficulty with urination. Had some back pain that is now resolved.   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   Malignant HTN - Pt. With poorly controlled htn at baseline. On four medications at home. Claims compliance, says he is followed by [facility] nephrology provider in [facility] for this. Presents here with BP 200's / 150's and evidence of possible renal artery dissection. BP brought down with prn labetalol and nitro drip which has now been discontinued. BP's remain elevated.  - Still requiring PRN labetalol. BP 168 / 92 in the room this am.  - Goal BP 150 - 160 / 90 - 100 - On Labetalol 3106m BID, Continue Hydralazine 1049mTID - Restarted Aldactone yesterday, will increase today and also increase labetalol - Was requiring 4 meds for control before.  - CT still difficult to interpret.  - Vasculitis panel negative so far. Consider checking Complements.  - Holding Cozaar - Trend BMET HTN likely due to this renal artery disruption - today will increase aldactone and labetalol- next step would be to add norvasc and possibly diuretic.  If BP is not controllable I think it would force our hand to do an intervention sooner rather than later in an effort to preserve his renal function specifically that left kidney -  AKI on CKD II - Pt. States that his baseline creatinine is 1.7. Cr - 1.8 on admission then up to 2 and now 1.9. He received Contrast for CTA. [facility]. Likely combination  of HTN, and IV contrast.  - Fluids stopped.  - Cr up slightly again today to 2.18 with Contrast yesterday. Should hopefully come down with clearance of contrast and BP control.  - Some difficulty urinating. Check bladder scan.  - Avoid dropping BP too low. Goals as above.  - Avoid nephrotoxic agents. Holding ARB. No NSAID's for pain (pt. Says he was taking alleve at home).  - Trend daily BMET.   Renal Artery Dissection vs. Vasculitis - Noted on  admit CT scan. DDX: FMD, Vasculitis, Dissection.  - Management per Vascular and IR.  - Current recs anticoagulation for 3 months and antiplatelet thereafter.  - Vasculitis panel negative so far.  - Will follow along. As above , this feels like something acute with acute onset flank pain and seemingly acute worsening BP.  He does NOT have vasculitis- esr is 10.  He tells me that there may have been an issue with his renal artery years ago.  Maybe he does have FMD or had a focal RAS previously but now something has changed- either a clot of a dissection.  Will do a renal ultrasound today to rulo out obstruction with his c/o urinary retention and to see if there is a size discrepency of the kidneys thinking then that an intervention would be less holpful  Asthma - no exacerbation. Mgmt per primary team.   BPH? -pt. Young for this, but does endorse urinary difficulty relieved by Flomax.   Disposition: Pending improvement of blood pressure.    Objective: Temp:  [97.2 F (36.2 C)-98.8 F (37.1 C)] 97.2 F (36.2 C) (11/11 0750) Pulse Rate:  [84-104] 87 (11/11 0750) Resp:  [13-24] 18 (11/11 0750) BP: (151-188)/(94-137) 175/124 mmHg (11/11 0750) SpO2:  [88 %-95 %] 94 % (11/11 0722) Physical Exam: General: NAD, AAOx3, resting in bed.  Cardiovascular: RRR, no MGR, normal S1/S2, 2+ distal pulses.  Respiratory: CTA Bilaterally, appropriate rate, unlabored.  Abdomen: S, NT, ND, No abdominal bruits audible. +BS. No back pain or abdominal pain.  Extremities: WWP, 2+ distal pulses, no edema, MAEW.   Laboratory:  Recent Labs Lab 03/13/15 0710 03/14/15 0412 03/15/15 0239  WBC 14.5* 13.3* 13.0*  HGB 13.1 13.5 13.2  HCT 38.5* 40.6 39.4  PLT 239 268 271    Recent Labs Lab 03/13/15 0049 03/14/15 0412 03/15/15 0239  NA 136 136 137  K 3.2* 3.6 3.5  CL 100* 104 101  CO2 28 26 23   BUN 17 11 13   CREATININE 1.91* 1.91* 2.18*  CALCIUM 8.9 8.9 8.8*  PROT 6.4* 7.1 6.8  BILITOT 0.7 0.9 0.8   ALKPHOS 47 40 41  ALT 14* 14* 14*  AST 17 16 17   GLUCOSE 105* 105* 108*    Echo > Moderate LVH, EF 60 -65%, G1 DD.    Imaging/Diagnostic Tests: IMPRESSION: There is irregularity and focal narrowing of the left renal artery with with surrounding fat stranding. There is some motion artifact limiting evaluation of this location. Overall findings are nonspecific however the top differential considerations would be focal left renal artery dissection or vasculitis. Additional but less likely consideration would be left renal artery infection in the setting of pyelonephritis. On single phase contrast-enhanced exam, the left kidney appears perfused.  Critical Value/emergent results were called by telephone at the time of interpretation on 03/12/2015 at 3:28 pm to Dr. Eulis Foster, who verbally acknowledged these results.  CTA 11/10:  IMPRESSION: 1. Long segment wall thickening and luminal narrowing in the superior left renal artery extending  into anterior posterior divisions. The findings are nonspecific and may represent atypical changes of fibromuscular dysplasia, large vessel arteritis (without any other evident areas of involvement), possibly with superimposed dissection. 2. Widely patent middle and aberrant inferior left renal arteries perfuse a significant portion of the left renal parenchyma.   Electronically Signed  By: Lovey Newcomer M.D.  On: 03/12/2015 15:30  Aquilla Hacker, MD 03/15/2015, 8:31 AM PGY-2  Patient seen and examined, agree with above note with above modifications. As above- will try to control BP over weekend and anticoagulate.  However, if his blood pressure does not come under control, he still has pain- it may be a good idea to intervene on this renal artery sooner rather than later  Corliss Parish, MD 03/15/2015

## 2015-03-16 LAB — CBC
HEMATOCRIT: 40 % (ref 39.0–52.0)
HEMOGLOBIN: 13.3 g/dL (ref 13.0–17.0)
MCH: 28.5 pg (ref 26.0–34.0)
MCHC: 33.3 g/dL (ref 30.0–36.0)
MCV: 85.8 fL (ref 78.0–100.0)
Platelets: 279 10*3/uL (ref 150–400)
RBC: 4.66 MIL/uL (ref 4.22–5.81)
RDW: 12.5 % (ref 11.5–15.5)
WBC: 13.2 10*3/uL — ABNORMAL HIGH (ref 4.0–10.5)

## 2015-03-16 LAB — COMPREHENSIVE METABOLIC PANEL
ALBUMIN: 3.3 g/dL — AB (ref 3.5–5.0)
ALK PHOS: 41 U/L (ref 38–126)
ALT: 14 U/L — AB (ref 17–63)
ANION GAP: 10 (ref 5–15)
AST: 21 U/L (ref 15–41)
BILIRUBIN TOTAL: 1 mg/dL (ref 0.3–1.2)
BUN: 11 mg/dL (ref 6–20)
CALCIUM: 9.2 mg/dL (ref 8.9–10.3)
CO2: 27 mmol/L (ref 22–32)
CREATININE: 2.33 mg/dL — AB (ref 0.61–1.24)
Chloride: 100 mmol/L — ABNORMAL LOW (ref 101–111)
GFR calc non Af Amer: 32 mL/min — ABNORMAL LOW (ref >60)
GFR, EST AFRICAN AMERICAN: 37 mL/min — AB (ref >60)
GLUCOSE: 96 mg/dL (ref 65–99)
Potassium: 3.2 mmol/L — ABNORMAL LOW (ref 3.5–5.1)
SODIUM: 137 mmol/L (ref 135–145)
TOTAL PROTEIN: 7.1 g/dL (ref 6.5–8.1)

## 2015-03-16 LAB — PROTIME-INR
INR: 1.22 (ref 0.00–1.49)
Prothrombin Time: 15.6 seconds — ABNORMAL HIGH (ref 11.6–15.2)

## 2015-03-16 LAB — HEPARIN LEVEL (UNFRACTIONATED): Heparin Unfractionated: 0.4 IU/mL (ref 0.30–0.70)

## 2015-03-16 MED ORDER — WARFARIN VIDEO
Freq: Once | Status: AC
  Administered 2015-03-16: 14:00:00

## 2015-03-16 MED ORDER — WARFARIN SODIUM 7.5 MG PO TABS
7.5000 mg | ORAL_TABLET | Freq: Once | ORAL | Status: AC
  Administered 2015-03-16: 7.5 mg via ORAL
  Filled 2015-03-16: qty 1

## 2015-03-16 MED ORDER — FUROSEMIDE 40 MG PO TABS
40.0000 mg | ORAL_TABLET | Freq: Every day | ORAL | Status: DC
  Administered 2015-03-16 – 2015-03-17 (×2): 40 mg via ORAL
  Filled 2015-03-16 (×2): qty 1

## 2015-03-16 MED ORDER — PATIENT'S GUIDE TO USING COUMADIN BOOK
Freq: Once | Status: AC
  Administered 2015-03-16: 14:00:00
  Filled 2015-03-16: qty 1

## 2015-03-16 MED ORDER — AMLODIPINE BESYLATE 5 MG PO TABS
5.0000 mg | ORAL_TABLET | Freq: Every day | ORAL | Status: DC
  Administered 2015-03-16 – 2015-03-18 (×3): 5 mg via ORAL
  Filled 2015-03-16 (×3): qty 1

## 2015-03-16 MED ORDER — POTASSIUM CHLORIDE CRYS ER 20 MEQ PO TBCR
40.0000 meq | EXTENDED_RELEASE_TABLET | Freq: Once | ORAL | Status: AC
  Administered 2015-03-16: 40 meq via ORAL
  Filled 2015-03-16: qty 2

## 2015-03-16 NOTE — Progress Notes (Signed)
Arrived to unit from [facility]. No distress. Vital signs stable. Skin intact. No distress. Safety plan discussed and in agreement. Oriented to unit, call system, telephone numbers. Will continue to monitor.

## 2015-03-16 NOTE — Progress Notes (Signed)
[facility] PROGRESS NOTE  Dylan Sweeney ZTI:[id number] DOB: 03/18/1971 DOA: 03/12/2015 PCP: No primary care provider on file.  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 He states that he has been compliant with his medications. CT imaging concerning for focal narrowing of the left renal artery, concern for focal left renal artery dissection vs vasculitis.  Still requiring PRN labetalol. Blood pressure still in the 170s Continue prn  labetalol for systolic greater than 825 Nephrology following for hypertension management, probably related to left renal artery dissection, further imaging of renal arteries  Unable to clearly identify a dissection, however this is still possible Currently on Norvasc 5 mg by mouth daily, Lasix 40 mg by mouth daily, hydralazine 100 mg by mouth every 8 hours, labetalol 400 mg by mouth twice a day, Aldactone 50 mg twice a day, -Still not able to achieve adequate blood pressure control  Hold Cozaar Repeat renal ultrasound showed chronic medical renal disease, bladder wall thickening, possible some degree of bladder outlet obstruction and mildly prominent prostrate   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 Unable to clearly identify a dissection, however this is still possible. needs  full anticoagulation for 3 months and then transition to anti-platelet therapy, now on heparin drip and Coumadin Continue heparin until INR therapeutic Transfer to telemetry Patient may have underlying fibromuscular dysplasia or focal renal artery stenosis Repeat UA negative for UTI  .AKI on CKD II - Pt. States that his baseline creatinine  is 1.7. Cr - 1.8 on admission Cr up slightly again today to 2.18 with Contrast yesterday. Should hopefully come down with clearance of contrast and BP control. He received Contrast for CTA. He says he follows with a nephrologist from the [place] in [facility]. Off  normal saline at 75 mL per hour,   Likely combination of HTN, and IV contrast.   Marijuana use-counseling done  Asthma-continue Advair, albuterol, stable  Alcohol abuse patient continue on CIWA protocol  BPH? Increased Flomax.   DVT prophylaxsis heparin drip  Code Status:      Code Status Orders        Start     Ordered   03/12/15 1917  Full code   Continuous     03/12/15 1918     Family Communication: family updated about patient's clinical progress Disposition Plan:  Continue stepdown, not stable for discharge, plan as above   Brief narrative: 44 year old male with a history of chronic kidney disease stage II followed at the [facility] in [place], hypertension, asthma which is under control, presents to the ER with left flank pain. Blood pressure upon arrival was 222/148. Flank pain is described as dull, constant pain, not particularly worse with movement, not reproducible to palpation. Denies any history of nephrolithiasis. Patient received 5 doses of IV labetalol, subsequently started on a nitroglycerin drip. CT scan which was performed to rule out dissection showed an irregularity and focal narrowing of the left renal artery with surrounding fat stranding. Findings nonspecific but ddx include renal artery dissection or vasculitis. Patient has a history of sickle cell trait, admits to using marijuana, denies cocaine use, drinks beer on a daily basis.  Consultants:  Nephrology  Vascular surgery  Procedures:  None none  Antibiotics: Anti-infectives    None         HPI/Subjective: Continues to be hypertensive in the 170s, no flank pain,   Objective: Filed Vitals:   03/16/15 0844 03/16/15  0852 03/16/15 1117 03/16/15 1119  BP:  172/111 172/132 172/132  Pulse:  92  109  Temp:  99 F (37.2 C)    TempSrc:  Oral    Resp:  17    Height:      Weight:      SpO2: 94% 93%      Intake/Output Summary (Last 24 hours) at 03/16/15 1135 Last data filed at 03/16/15 1120  Gross per 24 hour  Intake    983 ml  Output    700 ml  Net    283 ml    Exam:  General: NAD, AAOx3, resting in bed.  Cardiovascular: RRR, no MGR, normal S1/S2, 2+ distal pulses.  Respiratory: CTA Bilaterally, appropriate rate, unlabored.  Abdomen: S, NT, ND, No abdominal bruits audible. +BS.  Extremities: WWP, 2+ distal pulses, no edema, MAEW.  Data Review   Micro Results Recent Results (from the past 240 hour(s))  MRSA PCR Screening     Status: None   Collection Time: 03/12/15 10:47 PM  Result Value Ref Range Status   MRSA by PCR NEGATIVE NEGATIVE Final    Comment:        The GeneXpert MRSA Assay (FDA approved for NASAL specimens only), is one component of a comprehensive MRSA colonization surveillance program. It is not intended to diagnose MRSA infection nor to guide or monitor treatment for MRSA infections.     Radiology Reports US Renal  03/15/2015  CLINICAL DATA:  Hypertension, suprapubic pain.  Pelvic pain. EXAM: RENAL / URINARY TRACT ULTRASOUND COMPLETE COMPARISON:  CT 03/14/2015 FINDINGS: Right Kidney: Length: 11.2 cm. Mildly increased echotexture throughout the right kidney. Small lower pole cyst measures 8 mm. No hydronephrosis. Left Kidney: Length: 11.7 cm. Mildly increased echotexture. 10 mm upper pole and 11 mm lower pole cyst. No hydronephrosis. Bladder: Bladder wall appears mildly thickened at 5 mm.  Prostate prominent. IMPRESSION: Mildly increased echotexture in the kidneys bilaterally suggesting chronic medical renal disease. No hydronephrosis. Bladder wall thickening, possibly related to some degree of bladder outlet obstruction. Prostate mildly prominent. Electronically  Signed   By: Rolm Baptise M.D.   On: 03/15/2015 16:38   Ct Angio Chest Aorta W/cm &/or Wo/cm  03/12/2015  CLINICAL DATA:  Patient with back pain. Evaluate for aortic dissection. EXAM: CT ANGIOGRAPHY CHEST, ABDOMEN AND PELVIS TECHNIQUE: Multidetector CT imaging of the chest, abdomen and pelvis was performed using the standard protocol during bolus administration of intravenous contrast. Multiplanar CT image reconstructions and MIPs were obtained to evaluate the vascular anatomy. CONTRAST:  54m OMNIPAQUE IOHEXOL 350 MG/ML SOLN COMPARISON:  None. FINDINGS: CTA CHEST FINDINGS Mediastinum/Nodes: Noncontrast imaging through the thorax demonstrates no peripheral high attenuation within the thoracic aorta to suggest intramural hematoma formation. Post contrast-enhanced images demonstrate motion artifact limiting evaluation of the aortic root. The thoracic aorta is opacified with contrast material. No evidence for acute thoracic aortic dissection. The takeoff of the great vessels are patent. Visualized thyroid is unremarkable. No enlarged axillary, mediastinal or hilar lymphadenopathy. Heart is normal in size. No pericardial effusion. Aorta and main pulmonary artery normal in caliber. No evidence for central pulmonary embolism. Coronary arterial vascular calcifications. Lungs/Pleura: The central airways are patent. No consolidative pulmonary opacities. No  pleural effusion or pneumothorax. Review of the MIP images confirms the above findings. CTA ABDOMEN FINDINGS Hepatobiliary: Liver is normal in size and contour. No focal hepatic lesion is identified. The gallbladder is unremarkable. No intrahepatic or extrahepatic biliary ductal dilatation. Pancreas: Unremarkable Spleen: Unremarkable Adrenals/Urinary Tract: The adrenal glands are normal. The kidneys enhance symmetrically with contrast. Multiple bilateral low-attenuation subcentimeter renal lesions are demonstrated, too small to accurately characterize. There is  circumferential wall thickening of the urinary bladder. Stomach/Bowel: No abnormal bowel wall thickening or evidence for bowel obstruction. No free fluid or free intraperitoneal air. No evidence for bowel obstruction. Vascular/Lymphatic: The abdominal aorta is patent and normal in caliber. There is fat stranding about the left renal artery with apparent focal areas of narrowing, demonstrated best on sagittal images (image 103; series 503). The branch vessels including the celiac, superior mesenteric and inferior mesenteric arteries are patent. The common iliac vessels as well as the internal and external iliac vessels are patent. Other: Prostate enlarged. Musculoskeletal: No aggressive or acute appearing osseous lesions. Review of the MIP images confirms the above findings. IMPRESSION: There is irregularity and focal narrowing of the left renal artery with with surrounding fat stranding. There is some motion artifact limiting evaluation of this location. Overall findings are nonspecific however the top differential considerations would be focal left renal artery dissection or vasculitis. Additional but less likely consideration would be left renal artery infection in the setting of pyelonephritis. On single phase contrast-enhanced exam, the left kidney appears perfused. Critical Value/emergent results were called by telephone at the time of interpretation on 03/12/2015 at 3:28 pm to Dr. Eulis Foster, who verbally acknowledged these results. Electronically Signed   By: Lovey Newcomer M.D.   On: 03/12/2015 15:30   Ct Angio Abd/pel W/ And/or W/o  03/14/2015  CLINICAL DATA:  RULE OUT DISSECTION, PREVIOUS SCAN SUBOPTIMAL DUE TO MOTION ARTIFACT AND POOR CONTRAST BOLUS ENHANCEMENT CONCERN FOR LEFT RENAL ARTERY PATHOLOGY LOWER LEFT SIDE BACK PAIN SINCE Friday LAST WEEK : CT ANGIOGRAPHY ABDOMEN AND PELVIS TECHNIQUE: Multidetector CT imaging of the abdomen and pelvis was performed using the standard protocol during bolus  administration of intravenous contrast. Multiplanar reconstructed images including MIPs were obtained and reviewed to evaluate the vascular anatomy. CONTRAST:  5m OMNIPAQUE IOHEXOL 350 MG/ML SOLN, reduced dose due to renal insufficiency COMPARISON:  COMPARISON 03/12/2015 FINDINGS: ARTERIAL FINDINGS: Aorta:                Normal Celiac axis:          Patent Superior mesenteric:  Patent, classic distal branch anatomy. Left renal: There are 3. In the superior left renal artery, there is Circumferential wall thickening and luminal narrowing through most of the artery, with a short segment of relative sparing Extending over length of less than 1 cm in the midportion. The circumferential wall thickening and luminal narrowing extends through the bifurcation to involve posterior and to a lesser degree anterior divisions. There are mild regional inflammatory changes. The more diminutive middle left renal artery is unremarkable. The inferior left renal artery is aberrant, arising from the aorta below the level of the IMA, widely patent and unremarkable. Right renal:          Single, unremarkable. Inferior mesenteric:  Patent Left iliac:           Unremarkable Right iliac:          Unremarkable Venous findings: Dedicated venous phase imaging not obtained. Circumaortic left renal vein is noted. Review of the MIP  images confirms the above findings. Nonvascular findings: Platelike atelectasis or scarring in the lateral basal segment right lower lobe. Unremarkable arterial phase evaluation of liver, nondilated gallbladder, spleen, adrenal glands, pancreas. Probable cyst in the lower pole left kidney as before, incompletely characterized. Stomach, small bowel, and colon are nondilated. Normal appendix. A few scattered descending and sigmoid diverticula without adjacent inflammatory/ edematous change. Urinary bladder is somewhat thick walled, physiologically distended. Moderate prostatic enlargement. No ascites. No free air.  IMPRESSION: 1. Long segment wall thickening and luminal narrowing in the superior left renal artery extending into anterior posterior divisions. The findings are nonspecific and may represent atypical changes of fibromuscular dysplasia, large vessel arteritis (without any other evident areas of involvement), possibly with superimposed dissection. 2. Widely patent middle and aberrant inferior left renal arteries perfuse a significant portion of the left renal parenchyma. Electronically Signed   By: Lucrezia Europe M.D.   On: 03/14/2015 13:29   Ct Angio Abd/pel W/ And/or W/o  03/12/2015  CLINICAL DATA:  Patient with back pain. Evaluate for aortic dissection. EXAM: CT ANGIOGRAPHY CHEST, ABDOMEN AND PELVIS TECHNIQUE: Multidetector CT imaging of the chest, abdomen and pelvis was performed using the standard protocol during bolus administration of intravenous contrast. Multiplanar CT image reconstructions and MIPs were obtained to evaluate the vascular anatomy. CONTRAST:  26m OMNIPAQUE IOHEXOL 350 MG/ML SOLN COMPARISON:  None. FINDINGS: CTA CHEST FINDINGS Mediastinum/Nodes: Noncontrast imaging through the thorax demonstrates no peripheral high attenuation within the thoracic aorta to suggest intramural hematoma formation. Post contrast-enhanced images demonstrate motion artifact limiting evaluation of the aortic root. The thoracic aorta is opacified with contrast material. No evidence for acute thoracic aortic dissection. The takeoff of the great vessels are patent. Visualized thyroid is unremarkable. No enlarged axillary, mediastinal or hilar lymphadenopathy. Heart is normal in size. No pericardial effusion. Aorta and main pulmonary artery normal in caliber. No evidence for central pulmonary embolism. Coronary arterial vascular calcifications. Lungs/Pleura: The central airways are patent. No consolidative pulmonary opacities. No pleural effusion or pneumothorax. Review of the MIP images confirms the above findings. CTA  ABDOMEN FINDINGS Hepatobiliary: Liver is normal in size and contour. No focal hepatic lesion is identified. The gallbladder is unremarkable. No intrahepatic or extrahepatic biliary ductal dilatation. Pancreas: Unremarkable Spleen: Unremarkable Adrenals/Urinary Tract: The adrenal glands are normal. The kidneys enhance symmetrically with contrast. Multiple bilateral low-attenuation subcentimeter renal lesions are demonstrated, too small to accurately characterize. There is circumferential wall thickening of the urinary bladder. Stomach/Bowel: No abnormal bowel wall thickening or evidence for bowel obstruction. No free fluid or free intraperitoneal air. No evidence for bowel obstruction. Vascular/Lymphatic: The abdominal aorta is patent and normal in caliber. There is fat stranding about the left renal artery with apparent focal areas of narrowing, demonstrated best on sagittal images (image 103; series 503). The branch vessels including the celiac, superior mesenteric and inferior mesenteric arteries are patent. The common iliac vessels as well as the internal and external iliac vessels are patent. Other: Prostate enlarged. Musculoskeletal: No aggressive or acute appearing osseous lesions. Review of the MIP images confirms the above findings. IMPRESSION: There is irregularity and focal narrowing of the left renal artery with with surrounding fat stranding. There is some motion artifact limiting evaluation of this location. Overall findings are nonspecific however the top differential considerations would be focal left renal artery dissection or vasculitis. Additional but less likely consideration would be left renal artery infection in the setting of pyelonephritis. On single phase contrast-enhanced exam, the left kidney appears perfused.  Critical Value/emergent results were called by telephone at the time of interpretation on 03/12/2015 at 3:28 pm to Dr. Eulis Foster, who verbally acknowledged these results. Electronically  Signed   By: Lovey Newcomer M.D.   On: 03/12/2015 15:30     CBC  Recent Labs Lab 03/12/15 1335  03/12/15 2115 03/13/15 0710 03/14/15 0412 03/15/15 0239 03/16/15 0353  WBC 10.9*  --  13.1* 14.5* 13.3* 13.0* 13.2*  HGB 15.5  < > 13.6 13.1 13.5 13.2 13.3  HCT 44.9  < > 39.9 38.5* 40.6 39.4 40.0  PLT 276  --  242 239 268 271 279  MCV 84.9  --  86.0 85.7 86.0 86.2 85.8  MCH 29.3  --  29.3 29.2 28.6 28.9 28.5  MCHC 34.5  --  34.1 34.0 33.3 33.5 33.3  RDW 12.1  --  12.1 12.3 12.4 12.6 12.5  LYMPHSABS 2.4  --   --   --   --   --   --   MONOABS 0.5  --   --   --   --   --   --   EOSABS 0.6  --   --   --   --   --   --   BASOSABS 0.0  --   --   --   --   --   --   < > = values in this interval not displayed.  Chemistries   Recent Labs Lab 03/12/15 1345 03/12/15 2115 03/13/15 0049 03/14/15 0412 03/15/15 0239 03/16/15 0353  NA 138  --  136 136 137 137  K 3.7  --  3.2* 3.6 3.5 3.2*  CL 100*  --  100* 104 101 100*  CO2  --   --  _0 GLUCOSE 95  --  105* 105* 108* 96  BUN 24*  --  _1 CREATININE 1.80* 2.15* 1.91* 1.91* 2.18* 2.33*  CALCIUM  --   --  8.9 8.9 8.8* 9.2  MG  --  2.0  --   --   --   --   AST  --  _2 ALT  --  15* 14* 14* 14* 14*  ALKPHOS  --  48 47 40 41 41  BILITOT  --  0.7 0.7 0.9 0.8 1.0   ------------------------------------------------------------------------------------------------------------------ estimated creatinine clearance is 42.2 mL/min (by C-G formula based on Cr of 2.33). ------------------------------------------------------------------------------------------------------------------ No results for input(s): HGBA1C in the last 72 hours. ------------------------------------------------------------------------------------------------------------------ No results for input(s): CHOL, HDL, LDLCALC, TRIG, CHOLHDL, LDLDIRECT in the last 72  hours. ------------------------------------------------------------------------------------------------------------------ No results for input(s): TSH, T4TOTAL, T3FREE, THYROIDAB in the last 72 hours.  Invalid input(s): FREET3 ------------------------------------------------------------------------------------------------------------------ No results for input(s): VITAMINB12, FOLATE, FERRITIN, TIBC, IRON, RETICCTPCT in the last 72 hours.  Coagulation profile  Recent Labs Lab 03/15/15 1435 03/16/15 0353  INR 1.17 1.22    No results for input(s): DDIMER in the last 72 hours.  Cardiac Enzymes  Recent Labs Lab 03/12/15 2115 03/13/15 0049 03/13/15 0710  TROPONINI <0.03 <0.03 <0.03   ------------------------------------------------------------------------------------------------------------------ Invalid input(s): POCBNP   CBG: No results for input(s): GLUCAP in the last 168 hours.     Studies: US Renal  03/15/2015  CLINICAL DATA:  Hypertension, suprapubic pain.  Pelvic pain. EXAM: RENAL / URINARY TRACT ULTRASOUND COMPLETE COMPARISON:  CT 03/14/2015 FINDINGS: Right Kidney: Length: 11.2 cm. Mildly increased echotexture throughout the right kidney. Small lower pole cyst measures 8 mm. No hydronephrosis. Left  Kidney: Length: 11.7 cm. Mildly increased echotexture. 10 mm upper pole and 11 mm lower pole cyst. No hydronephrosis. Bladder: Bladder wall appears mildly thickened at 5 mm.  Prostate prominent. IMPRESSION: Mildly increased echotexture in the kidneys bilaterally suggesting chronic medical renal disease. No hydronephrosis. Bladder wall thickening, possibly related to some degree of bladder outlet obstruction. Prostate mildly prominent. Electronically Signed   By: Rolm Baptise M.D.   On: 03/15/2015 16:38      Lab Results  Component Value Date   HGBA1C 5.0 03/12/2015   Lab Results  Component Value Date   LDLCALC 66 03/13/2015   CREATININE 2.33* 03/16/2015        Scheduled Meds: . amLODipine  5 mg Oral Daily  . folic acid  1 mg Oral Daily  . furosemide  40 mg Oral Daily  . hydrALAZINE  100 mg Oral 3 times per day  . labetalol  400 mg Oral BID  . mometasone-formoterol  2 puff Inhalation BID  . multivitamin with minerals  1 tablet Oral Daily  . omega-3 acid ethyl esters  1 g Oral BID  . patient's guide to using coumadin book   Does not apply Once  . patient's guide to using coumadin book   Does not apply Once  . sodium chloride  3 mL Intravenous Q12H  . spironolactone  50 mg Oral BID  . tamsulosin  0.8 mg Oral QPC breakfast  . thiamine  100 mg Oral Daily  . warfarin  7.5 mg Oral ONCE-1800  . warfarin   Does not apply Once  . Warfarin - Pharmacist Dosing Inpatient   Does not apply q1800   Continuous Infusions: . heparin 1,800 Units/hr (03/16/15 3149)    Active Problems:   Hypertensive urgency   Hypertensive urgency, malignant   Marijuana use    Time spent: 45 minutes   [facility] Pager [phone]. If 7PM-7AM, please contact night-coverage at [email or web address], password [facility] 03/16/2015, 11:35 AM  LOS: 4 days

## 2015-03-16 NOTE — Progress Notes (Signed)
ANTICOAGULATION CONSULT NOTE - Follow up [place] for Heparin Indication: Renal artery dissection  Allergies  Allergen Reactions  . Other Other (See Comments)    G6 pill allergy: pill given before travel in [place]    Patient Measurements: Height: 5\' 11"  (180.3 cm) Weight: 187 lb 13.3 oz (85.2 kg) IBW/kg (Calculated) : 75.3  Heparin Wt: 85.2 kg   Vital Signs: Temp: 99 F (37.2 C) (11/12 0852) Temp Source: Oral (11/12 0852) BP: 172/132 mmHg (11/12 1119) Pulse Rate: 109 (11/12 1119)  Labs:  Recent Labs  03/14/15 0412  03/15/15 0042 03/15/15 0239 03/15/15 1435 03/16/15 0353  HGB 13.5  --   --  13.2  --  13.3  HCT 40.6  --   --  39.4  --  40.0  PLT 268  --   --  271  --  279  LABPROT  --   --   --   --  15.0 15.6*  INR  --   --   --   --  1.17 1.22  HEPARINUNFRC 0.27*  < > 0.36 0.30  --  0.40  CREATININE 1.91*  --   --  2.18*  --  2.33*  < > = values in this interval not displayed.  Estimated Creatinine Clearance: 42.2 mL/min (by C-G formula based on Cr of 2.33).   Assessment: 13 YOM with hx CKD II and HTN who presented to the [facility] on 11/8 with left flank pain.  Abdominal CT showed possible renal artery dissection and pharmacy was consulted to start heparin for anticoagulation. Coumadin started 11/11.   The patient's heparin level this morning remains therapeutic (HL 0.4, goal of 0.3-0.7). CBC stable - no s/sx of bleeding noted.  VVS recs: coumadin x 3 months, then switch to aspirin or plavix.  INR 1.22 after first dose of coumadin.  Goal of Therapy:  Heparin level 0.3-0.7 units/ml Monitor platelets by anticoagulation protocol: Yes  INR 2-3   Plan:  - continue heparin drip at 1800 units/hr (18 ml/hr) - repeat coumadin 7.5 mg po x 1 dose today - coumadin book and video for education ordered yesterday but not charted as done - will re-order for today - f/u education - daily HL, CBC, INR  Eudelia Bunch, Pharm.D. BP:[id number] 03/16/2015 11:30  AM

## 2015-03-16 NOTE — Progress Notes (Signed)
Subjective:  BP remains high- reasonable UOP- renal ultrasound no signs of true obstruction- kidneys not assymetric- creatinine rising some Objective Vital signs in last 24 hours: Filed Vitals:   03/15/15 2000 03/15/15 2057 03/15/15 2334 03/16/15 0416  BP: 165/118  159/111 164/118  Pulse: 90 84 86 85  Temp: 98.8 F (37.1 C)  99.3 F (37.4 C) 98.4 F (36.9 C)  TempSrc: Oral  Oral Oral  Resp: 15 12 11 15   Height:      Weight:      SpO2: 72% 93%  97%   Weight change:   Intake/Output Summary (Last 24 hours) at 03/16/15 0827 Last data filed at 03/16/15 0433  Gross per 24 hour  Intake    520 ml  Output    825 ml  Net   -305 ml    Assessment/ Plan: Pt is a 44 y.o. yo male with known CKD and HTN- (possible renal artery issue diagnosed in past) who was admitted on 03/12/2015 with flank pain- imaging suspicious for renal artery dissection and malignant HTN  Assessment/Plan: 1. 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  There has now been some acute disruption of flow and likely dissection/thrombus of that artery giving [place] this picture.  I agree that intervention on this can be tricky BUT if we are not able to achieve BP control it may be our only option- kidney size indicates that this kidney is salvageable 2. Renal - A on CRF in the setting of 2 dye procedures and malignant HTN.  U/S neg for obstruction and U/A not indicative of intrarenal process.  Hoping that as more time from dye and as BP gets better the renal function will settle out 3. HTN- malignant.  I have made medication change every day.  Now on hydralazine 100 TID, labetalol 400 BID, aldactone 50 BID.  I will add norvasc 5 and a small dose of lasix    Sagan Wurzel A    Labs: Basic Metabolic Panel:  Recent Labs Lab 03/14/15 0412 03/15/15 0239 03/16/15 0353  NA 136 137 137  K 3.6 3.5 3.2*   CL 104 101 100*  CO2 26 23 27   GLUCOSE 105* 108* 96  BUN 11 13 11   CREATININE 1.91* 2.18* 2.33*  CALCIUM 8.9 8.8* 9.2   Liver Function Tests:  Recent Labs Lab 03/14/15 0412 03/15/15 0239 03/16/15 0353  AST 16 17 21   ALT 14* 14* 14*  ALKPHOS 40 41 41  BILITOT 0.9 0.8 1.0  PROT 7.1 6.8 7.1  ALBUMIN 3.2* 3.5 3.3*   No results for input(s): LIPASE, AMYLASE in the last 168 hours. No results for input(s): AMMONIA in the last 168 hours. CBC:  Recent Labs Lab 03/12/15 1335  03/12/15 2115 03/13/15 0710 03/14/15 0412 03/15/15 0239 03/16/15 0353  WBC 10.9*  --  13.1* 14.5* 13.3* 13.0* 13.2*  NEUTROABS 7.3  --   --   --   --   --   --   HGB 15.5  < > 13.6 13.1 13.5 13.2 13.3  HCT 44.9  < > 39.9 38.5* 40.6 39.4 40.0  MCV 84.9  --  86.0 85.7 86.0 86.2 85.8  PLT 276  --  242 239 268 271 279  < > = values in this interval not displayed. Cardiac Enzymes:  Recent Labs Lab 03/12/15 2115 03/13/15 0049 03/13/15  0710  TROPONINI <0.03 <0.03 <0.03   CBG: No results for input(s): GLUCAP in the last 168 hours.  Iron Studies: No results for input(s): IRON, TIBC, TRANSFERRIN, FERRITIN in the last 72 hours. Studies/Results: US Renal  03/15/2015  CLINICAL DATA:  Hypertension, suprapubic pain.  Pelvic pain. EXAM: RENAL / URINARY TRACT ULTRASOUND COMPLETE COMPARISON:  CT 03/14/2015 FINDINGS: Right Kidney: Length: 11.2 cm. Mildly increased echotexture throughout the right kidney. Small lower pole cyst measures 8 mm. No hydronephrosis. Left Kidney: Length: 11.7 cm. Mildly increased echotexture. 10 mm upper pole and 11 mm lower pole cyst. No hydronephrosis. Bladder: Bladder wall appears mildly thickened at 5 mm.  Prostate prominent. IMPRESSION: Mildly increased echotexture in the kidneys bilaterally suggesting chronic medical renal disease. No hydronephrosis. Bladder wall thickening, possibly related to some degree of bladder outlet obstruction. Prostate mildly prominent. Electronically Signed    By: Rolm Baptise M.D.   On: 03/15/2015 16:38   Ct Angio Abd/pel W/ And/or W/o  03/14/2015  CLINICAL DATA:  RULE OUT DISSECTION, PREVIOUS SCAN SUBOPTIMAL DUE TO MOTION ARTIFACT AND POOR CONTRAST BOLUS ENHANCEMENT CONCERN FOR LEFT RENAL ARTERY PATHOLOGY LOWER LEFT SIDE BACK PAIN SINCE Friday LAST WEEK : CT ANGIOGRAPHY ABDOMEN AND PELVIS TECHNIQUE: Multidetector CT imaging of the abdomen and pelvis was performed using the standard protocol during bolus administration of intravenous contrast. Multiplanar reconstructed images including MIPs were obtained and reviewed to evaluate the vascular anatomy. CONTRAST:  22mL OMNIPAQUE IOHEXOL 350 MG/ML SOLN, reduced dose due to renal insufficiency COMPARISON:  COMPARISON 03/12/2015 FINDINGS: ARTERIAL FINDINGS: Aorta:                Normal Celiac axis:          Patent Superior mesenteric:  Patent, classic distal branch anatomy. Left renal: There are 3. In the superior left renal artery, there is Circumferential wall thickening and luminal narrowing through most of the artery, with a short segment of relative sparing Extending over length of less than 1 cm in the midportion. The circumferential wall thickening and luminal narrowing extends through the bifurcation to involve posterior and to a lesser degree anterior divisions. There are mild regional inflammatory changes. The more diminutive middle left renal artery is unremarkable. The inferior left renal artery is aberrant, arising from the aorta below the level of the IMA, widely patent and unremarkable. Right renal:          Single, unremarkable. Inferior mesenteric:  Patent Left iliac:           Unremarkable Right iliac:          Unremarkable Venous findings: Dedicated venous phase imaging not obtained. Circumaortic left renal vein is noted. Review of the MIP images confirms the above findings. Nonvascular findings: Platelike atelectasis or scarring in the lateral basal segment right lower lobe. Unremarkable arterial  phase evaluation of liver, nondilated gallbladder, spleen, adrenal glands, pancreas. Probable cyst in the lower pole left kidney as before, incompletely characterized. Stomach, small bowel, and colon are nondilated. Normal appendix. A few scattered descending and sigmoid diverticula without adjacent inflammatory/ edematous change. Urinary bladder is somewhat thick walled, physiologically distended. Moderate prostatic enlargement. No ascites. No free air. IMPRESSION: 1. Long segment wall thickening and luminal narrowing in the superior left renal artery extending into anterior posterior divisions. The findings are nonspecific and may represent atypical changes of fibromuscular dysplasia, large vessel arteritis (without any other evident areas of involvement), possibly with superimposed dissection. 2. Widely patent middle and aberrant inferior left renal arteries perfuse  a significant portion of the left renal parenchyma. Electronically Signed   By: Lucrezia Europe M.D.   On: 03/14/2015 13:29   Medications: Infusions: . heparin 1,800 Units/hr (03/16/15 [id number])    Scheduled Medications: . folic acid  1 mg Oral Daily  . hydrALAZINE  100 mg Oral 3 times per day  . labetalol  400 mg Oral BID  . mometasone-formoterol  2 puff Inhalation BID  . multivitamin with minerals  1 tablet Oral Daily  . omega-3 acid ethyl esters  1 g Oral BID  . patient's guide to using coumadin book   Does not apply Once  . potassium chloride  40 mEq Oral Once  . sodium chloride  3 mL Intravenous Q12H  . spironolactone  50 mg Oral BID  . tamsulosin  0.8 mg Oral QPC breakfast  . thiamine  100 mg Oral Daily  . warfarin   Does not apply Once  . Warfarin - Pharmacist Dosing Inpatient   Does not apply q1800    have reviewed scheduled and prn medications.  Physical Exam: General: NAD Heart:RRR Lungs: mostly clr Abdomen: soft, non tender Extremities: no edema    03/16/2015,8:27 AM  LOS: 4 days

## 2015-03-16 NOTE — Progress Notes (Addendum)
Completed coumadin education video; has booklet

## 2015-03-17 DIAGNOSIS — R52 Pain, unspecified: Secondary | ICD-10-CM

## 2015-03-17 LAB — RENAL FUNCTION PANEL
ALBUMIN: 3.4 g/dL — AB (ref 3.5–5.0)
Anion gap: 9 (ref 5–15)
BUN: 15 mg/dL (ref 6–20)
CO2: 28 mmol/L (ref 22–32)
Calcium: 9.4 mg/dL (ref 8.9–10.3)
Chloride: 96 mmol/L — ABNORMAL LOW (ref 101–111)
Creatinine, Ser: 2.63 mg/dL — ABNORMAL HIGH (ref 0.61–1.24)
GFR, EST AFRICAN AMERICAN: 32 mL/min — AB (ref >60)
GFR, EST NON AFRICAN AMERICAN: 28 mL/min — AB (ref >60)
GLUCOSE: 127 mg/dL — AB (ref 65–99)
PHOSPHORUS: 3.8 mg/dL (ref 2.5–4.6)
POTASSIUM: 3.5 mmol/L (ref 3.5–5.1)
Sodium: 133 mmol/L — ABNORMAL LOW (ref 135–145)

## 2015-03-17 LAB — CBC
HEMATOCRIT: 40.4 % (ref 39.0–52.0)
HEMOGLOBIN: 13.6 g/dL (ref 13.0–17.0)
MCH: 29 pg (ref 26.0–34.0)
MCHC: 33.7 g/dL (ref 30.0–36.0)
MCV: 86.1 fL (ref 78.0–100.0)
Platelets: 292 10*3/uL (ref 150–400)
RBC: 4.69 MIL/uL (ref 4.22–5.81)
RDW: 12.6 % (ref 11.5–15.5)
WBC: 15.4 10*3/uL — AB (ref 4.0–10.5)

## 2015-03-17 LAB — PROTIME-INR
INR: 1.44 (ref 0.00–1.49)
Prothrombin Time: 17.6 seconds — ABNORMAL HIGH (ref 11.6–15.2)

## 2015-03-17 LAB — HEPARIN LEVEL (UNFRACTIONATED): HEPARIN UNFRACTIONATED: 0.38 [IU]/mL (ref 0.30–0.70)

## 2015-03-17 MED ORDER — WARFARIN SODIUM 7.5 MG PO TABS
7.5000 mg | ORAL_TABLET | Freq: Once | ORAL | Status: AC
  Administered 2015-03-17: 7.5 mg via ORAL
  Filled 2015-03-17: qty 1

## 2015-03-17 MED ORDER — POLYETHYLENE GLYCOL 3350 17 G PO PACK
17.0000 g | PACK | Freq: Every day | ORAL | Status: DC
  Administered 2015-03-17 – 2015-03-19 (×3): 17 g via ORAL
  Filled 2015-03-17 (×4): qty 1

## 2015-03-17 MED ORDER — DOCUSATE SODIUM 100 MG PO CAPS
100.0000 mg | ORAL_CAPSULE | Freq: Two times a day (BID) | ORAL | Status: DC
  Administered 2015-03-17 – 2015-03-19 (×4): 100 mg via ORAL
  Filled 2015-03-17 (×5): qty 1

## 2015-03-17 NOTE — Progress Notes (Signed)
   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 Trula Slade will be back tomorrow  Subjective   No complaints  Objective Filed Vitals:   03/16/15 2030 03/17/15 0006 03/17/15 0603 03/17/15 0735  BP: 152/94 129/75 165/120 153/91  Pulse: 98 95 102 97  Temp: 98.5 F (36.9 C) 99.1 F (37.3 C) 99.3 F (37.4 C)   TempSrc: Oral Oral Oral   Resp: 18 18 18    Height:      Weight:      SpO2: 95% 93% 94%     Intake/Output Summary (Last 24 hours) at 03/17/15 0852 Last data filed at 03/17/15 0602  Gross per 24 hour  Intake 1170.1 ml  Output   1430 ml  Net -259.9 ml   GI  soft, NTND, no flank bruits  Laboratory CBC    Component Value Date/Time   WBC 15.4* 03/17/2015 0432   HGB 13.6 03/17/2015 0432   HCT 40.4 03/17/2015 0432   PLT 292 03/17/2015 0432    BMET    Component Value Date/Time   NA 133* 03/17/2015 0432   K 3.5 03/17/2015 0432   CL 96* 03/17/2015 0432   CO2 28 03/17/2015 0432   GLUCOSE 127* 03/17/2015 0432   BUN 15 03/17/2015 0432   CREATININE 2.63* 03/17/2015 0432   CALCIUM 9.4 03/17/2015 0432   GFRNONAA 28* 03/17/2015 [place] 32* 03/17/2015 [place], MD [facility]: [phone] Pager: [phone]  03/17/2015, 8:52 AM

## 2015-03-17 NOTE — Progress Notes (Signed)
ANTICOAGULATION CONSULT NOTE - Follow up Consult  Pharmacy Consult for Heparin/Warfarin Indication: Renal artery dissection  Allergies  Allergen Reactions  . Other Other (See Comments)    G6 pill allergy: pill given before travel in [place]    Patient Measurements: Height: 5\' 11"  (180.3 cm) Weight: 187 lb 13.3 oz (85.2 kg) IBW/kg (Calculated) : 75.3  Heparin Wt: 85.2 kg   Vital Signs: Temp: 99.3 F (37.4 C) (11/13 0603) Temp Source: Oral (11/13 0603) BP: 153/91 mmHg (11/13 0735) Pulse Rate: 97 (11/13 0735)  Labs:  Recent Labs  03/15/15 0239 03/15/15 1435 03/16/15 0353 03/17/15 0432  HGB 13.2  --  13.3 13.6  HCT 39.4  --  40.0 40.4  PLT 271  --  279 292  LABPROT  --  15.0 15.6* 17.6*  INR  --  1.17 1.22 1.44  HEPARINUNFRC 0.30  --  0.40 0.38  CREATININE 2.18*  --  2.33* 2.63*    Estimated Creatinine Clearance: 37.4 mL/min (by C-G formula based on Cr of 2.63).   Assessment: 42 YOM with hx CKD II and HTN who presented to the [facility] on 11/8 with left flank pain.  Abdominal CT showed possible renal artery dissection and pharmacy was consulted to start heparin for anticoagulation. Coumadin started 11/11.   The patient's heparin level this morning remains therapeutic (HL 0.38, goal of 0.3-0.7). CBC stable - no s/sx of bleeding noted.  VVS recs: coumadin x 3 months, then switch to aspirin or plavix.  INR 1.44 after 2nd dose of coumadin.  Goal of Therapy:  Heparin level 0.3-0.7 units/ml Monitor platelets by anticoagulation protocol: Yes  INR 2-3   Plan:  Continue heparin drip at 1800 units/hr (18 ml/hr) Repeat coumadin 7.5 mg po x 1 dose today Daily HL, CBC, INR  Rob Evette Doffing, Pharm.D. PGY1 Resident Pager: [phone] 03/17/2015 7:45 AM

## 2015-03-17 NOTE — Progress Notes (Signed)
[facility] PROGRESS NOTE  Dylan Sweeney JOI:[id number] DOB: 08/22/1970 DOA: 03/12/2015 PCP: No primary care provider on file.  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  Continue PRN labetalol. 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 Unable to clearly identify a dissection, however this is still possible. needs  full anticoagulation for 3 months and then transition to anti-platelet therapy, now on heparin drip and Coumadin Continue heparin until INR therapeutic Cont  telemetry Patient may have underlying fibromuscular dysplasia or focal renal artery stenosis Dr. Trula Slade reassess tomorrow, likely cannot have an angiogram given worsening renal function   .AKI on CKD II - baseline creatinine 1.7. Cr - 1.8 , continues to worsen, creatinine now 2.63.  Marland Kitchen Should hopefully come down with clearance of contrast and BP control. He received Contrast for CTA  Twice .  Off  normal saline at 75 mL per hour,   Likely combination of HTN, and  IV contrast.   Marijuana use-counseling done  Asthma-continue Advair, albuterol, stable  Alcohol abuse stable on CIWA protocol, did not witness any signs of alcohol withdrawal during this admission  BPH? Increased Flomax.   DVT prophylaxsis heparin drip  Code Status:      Code Status Orders        Start     Ordered   03/12/15 1917  Full code   Continuous     03/12/15 1918     Family Communication: family updated about patient's clinical progress Disposition Plan:  2-3 days  Brief narrative: 44 year old male with a history of chronic kidney disease stage II followed at the [facility] in [place], hypertension, asthma which is under control, presents to the ER with left flank pain. Blood pressure upon arrival was 222/148. Flank pain is described as dull, constant pain, not particularly worse with movement, not reproducible to palpation. Denies any history of nephrolithiasis. Patient received 5 doses of IV labetalol, subsequently started on a nitroglycerin drip. CT scan which was performed to rule out dissection showed an irregularity and focal narrowing of the left renal artery with surrounding fat stranding. Findings nonspecific but ddx include renal artery dissection or vasculitis. Patient has a history of sickle cell trait, admits to using marijuana, denies cocaine use, drinks beer on a daily basis.    Consultants:  Nephrology  Vascular surgery  Procedures:  None none  Antibiotics: Anti-infectives    None         HPI/Subjective: Hypertension somewhat better overnight, urine output is declining  Objective: Filed Vitals:   03/16/15 2030 03/17/15 0006 03/17/15 0603 03/17/15 0735  BP:  152/94 129/75 165/120 153/91  Pulse: 98 95 102 97  Temp: 98.5 F (36.9 C) 99.1 F (37.3 C) 99.3 F (37.4 C)   TempSrc: Oral Oral Oral   Resp: _0 Height:      Weight:      SpO2: 95% 93% 94%     Intake/Output Summary (Last 24 hours) at 03/17/15 0958 Last  data filed at 03/17/15 0602  Gross per 24 hour  Intake 1134.1 ml  Output   1430 ml  Net -295.9 ml    Exam:  General: NAD, AAOx3, resting in bed.  Cardiovascular: RRR, no MGR, normal S1/S2, 2+ distal pulses.  Respiratory: CTA Bilaterally, appropriate rate, unlabored.  Abdomen: S, NT, ND, No abdominal bruits audible. +BS.  Extremities: WWP, 2+ distal pulses, no edema, MAEW.  Data Review   Micro Results Recent Results (from the past 240 hour(s))  MRSA PCR Screening     Status: None   Collection Time: 03/12/15 10:47 PM  Result Value Ref Range Status   MRSA by PCR NEGATIVE NEGATIVE Final    Comment:        The GeneXpert MRSA Assay (FDA approved for NASAL specimens only), is one component of a comprehensive MRSA colonization surveillance program. It is not intended to diagnose MRSA infection nor to guide or monitor treatment for MRSA infections.     Radiology Reports US Renal  03/15/2015  CLINICAL DATA:  Hypertension, suprapubic pain.  Pelvic pain. EXAM: RENAL / URINARY TRACT ULTRASOUND COMPLETE COMPARISON:  CT 03/14/2015 FINDINGS: Right Kidney: Length: 11.2 cm. Mildly increased echotexture throughout the right kidney. Small lower pole cyst measures 8 mm. No hydronephrosis. Left Kidney: Length: 11.7 cm. Mildly increased echotexture. 10 mm upper pole and 11 mm lower pole cyst. No hydronephrosis. Bladder: Bladder wall appears mildly thickened at 5 mm.  Prostate prominent. IMPRESSION: Mildly increased echotexture in the kidneys bilaterally suggesting chronic medical renal disease. No hydronephrosis. Bladder wall thickening, possibly related to some degree of bladder outlet obstruction. Prostate mildly prominent. Electronically Signed   By: Rolm Baptise M.D.   On: 03/15/2015 16:38   Ct Angio Chest Aorta W/cm &/or Wo/cm  03/12/2015  CLINICAL DATA:  Patient with back pain. Evaluate for aortic dissection. EXAM: CT ANGIOGRAPHY CHEST, ABDOMEN AND PELVIS TECHNIQUE: Multidetector CT  imaging of the chest, abdomen and pelvis was performed using the standard protocol during bolus administration of intravenous contrast. Multiplanar CT image reconstructions and MIPs were obtained to evaluate the vascular anatomy. CONTRAST:  38m OMNIPAQUE IOHEXOL 350 MG/ML SOLN COMPARISON:  None. FINDINGS: CTA CHEST FINDINGS Mediastinum/Nodes: Noncontrast imaging through the thorax demonstrates no peripheral high attenuation within the thoracic aorta to suggest intramural hematoma formation. Post contrast-enhanced images demonstrate motion artifact limiting evaluation of the aortic root. The thoracic aorta is opacified with contrast material. No evidence for acute thoracic aortic dissection. The takeoff of the great vessels are patent. Visualized thyroid is unremarkable. No enlarged axillary, mediastinal or hilar lymphadenopathy. Heart is normal in size. No pericardial effusion. Aorta and main pulmonary artery normal in caliber. No evidence for central pulmonary embolism. Coronary arterial vascular calcifications. Lungs/Pleura: The central airways are patent. No consolidative pulmonary opacities. No pleural effusion or pneumothorax. Review of the MIP images confirms the above findings. CTA ABDOMEN FINDINGS Hepatobiliary: Liver is normal in size and contour. No focal hepatic lesion is identified. The gallbladder is unremarkable. No intrahepatic or extrahepatic biliary ductal dilatation. Pancreas: Unremarkable Spleen: Unremarkable Adrenals/Urinary Tract: The adrenal glands are normal. The kidneys enhance  symmetrically with contrast. Multiple bilateral low-attenuation subcentimeter renal lesions are demonstrated, too small to accurately characterize. There is circumferential wall thickening of the urinary bladder. Stomach/Bowel: No abnormal bowel wall thickening or evidence for bowel obstruction. No free fluid or free intraperitoneal air. No evidence for bowel obstruction. Vascular/Lymphatic: The abdominal aorta is  patent and normal in caliber. There is fat stranding about the left renal artery with apparent focal areas of narrowing, demonstrated best on sagittal images (image 103; series 503). The branch vessels including the celiac, superior mesenteric and inferior mesenteric arteries are patent. The common iliac vessels as well as the internal and external iliac vessels are patent. Other: Prostate enlarged. Musculoskeletal: No aggressive or acute appearing osseous lesions. Review of the MIP images confirms the above findings. IMPRESSION: There is irregularity and focal narrowing of the left renal artery with with surrounding fat stranding. There is some motion artifact limiting evaluation of this location. Overall findings are nonspecific however the top differential considerations would be focal left renal artery dissection or vasculitis. Additional but less likely consideration would be left renal artery infection in the setting of pyelonephritis. On single phase contrast-enhanced exam, the left kidney appears perfused. Critical Value/emergent results were called by telephone at the time of interpretation on 03/12/2015 at 3:28 pm to Dr. Eulis Foster, who verbally acknowledged these results. Electronically Signed   By: Lovey Newcomer M.D.   On: 03/12/2015 15:30   Ct Angio Abd/pel W/ And/or W/o  03/14/2015  CLINICAL DATA:  RULE OUT DISSECTION, PREVIOUS SCAN SUBOPTIMAL DUE TO MOTION ARTIFACT AND POOR CONTRAST BOLUS ENHANCEMENT CONCERN FOR LEFT RENAL ARTERY PATHOLOGY LOWER LEFT SIDE BACK PAIN SINCE Friday LAST WEEK : CT ANGIOGRAPHY ABDOMEN AND PELVIS TECHNIQUE: Multidetector CT imaging of the abdomen and pelvis was performed using the standard protocol during bolus administration of intravenous contrast. Multiplanar reconstructed images including MIPs were obtained and reviewed to evaluate the vascular anatomy. CONTRAST:  56m OMNIPAQUE IOHEXOL 350 MG/ML SOLN, reduced dose due to renal insufficiency COMPARISON:  COMPARISON  03/12/2015 FINDINGS: ARTERIAL FINDINGS: Aorta:                Normal Celiac axis:          Patent Superior mesenteric:  Patent, classic distal branch anatomy. Left renal: There are 3. In the superior left renal artery, there is Circumferential wall thickening and luminal narrowing through most of the artery, with a short segment of relative sparing Extending over length of less than 1 cm in the midportion. The circumferential wall thickening and luminal narrowing extends through the bifurcation to involve posterior and to a lesser degree anterior divisions. There are mild regional inflammatory changes. The more diminutive middle left renal artery is unremarkable. The inferior left renal artery is aberrant, arising from the aorta below the level of the IMA, widely patent and unremarkable. Right renal:          Single, unremarkable. Inferior mesenteric:  Patent Left iliac:           Unremarkable Right iliac:          Unremarkable Venous findings: Dedicated venous phase imaging not obtained. Circumaortic left renal vein is noted. Review of the MIP images confirms the above findings. Nonvascular findings: Platelike atelectasis or scarring in the lateral basal segment right lower lobe. Unremarkable arterial phase evaluation of liver, nondilated gallbladder, spleen, adrenal glands, pancreas. Probable cyst in the lower pole left kidney as before, incompletely characterized. Stomach, small bowel, and colon are nondilated. Normal appendix. A few scattered  descending and sigmoid diverticula without adjacent inflammatory/ edematous change. Urinary bladder is somewhat thick walled, physiologically distended. Moderate prostatic enlargement. No ascites. No free air. IMPRESSION: 1. Long segment wall thickening and luminal narrowing in the superior left renal artery extending into anterior posterior divisions. The findings are nonspecific and may represent atypical changes of fibromuscular dysplasia, large vessel arteritis (without  any other evident areas of involvement), possibly with superimposed dissection. 2. Widely patent middle and aberrant inferior left renal arteries perfuse a significant portion of the left renal parenchyma. Electronically Signed   By: Lucrezia Europe M.D.   On: 03/14/2015 13:29   Ct Angio Abd/pel W/ And/or W/o  03/12/2015  CLINICAL DATA:  Patient with back pain. Evaluate for aortic dissection. EXAM: CT ANGIOGRAPHY CHEST, ABDOMEN AND PELVIS TECHNIQUE: Multidetector CT imaging of the chest, abdomen and pelvis was performed using the standard protocol during bolus administration of intravenous contrast. Multiplanar CT image reconstructions and MIPs were obtained to evaluate the vascular anatomy. CONTRAST:  22m OMNIPAQUE IOHEXOL 350 MG/ML SOLN COMPARISON:  None. FINDINGS: CTA CHEST FINDINGS Mediastinum/Nodes: Noncontrast imaging through the thorax demonstrates no peripheral high attenuation within the thoracic aorta to suggest intramural hematoma formation. Post contrast-enhanced images demonstrate motion artifact limiting evaluation of the aortic root. The thoracic aorta is opacified with contrast material. No evidence for acute thoracic aortic dissection. The takeoff of the great vessels are patent. Visualized thyroid is unremarkable. No enlarged axillary, mediastinal or hilar lymphadenopathy. Heart is normal in size. No pericardial effusion. Aorta and main pulmonary artery normal in caliber. No evidence for central pulmonary embolism. Coronary arterial vascular calcifications. Lungs/Pleura: The central airways are patent. No consolidative pulmonary opacities. No pleural effusion or pneumothorax. Review of the MIP images confirms the above findings. CTA ABDOMEN FINDINGS Hepatobiliary: Liver is normal in size and contour. No focal hepatic lesion is identified. The gallbladder is unremarkable. No intrahepatic or extrahepatic biliary ductal dilatation. Pancreas: Unremarkable Spleen: Unremarkable Adrenals/Urinary Tract: The  adrenal glands are normal. The kidneys enhance symmetrically with contrast. Multiple bilateral low-attenuation subcentimeter renal lesions are demonstrated, too small to accurately characterize. There is circumferential wall thickening of the urinary bladder. Stomach/Bowel: No abnormal bowel wall thickening or evidence for bowel obstruction. No free fluid or free intraperitoneal air. No evidence for bowel obstruction. Vascular/Lymphatic: The abdominal aorta is patent and normal in caliber. There is fat stranding about the left renal artery with apparent focal areas of narrowing, demonstrated best on sagittal images (image 103; series 503). The branch vessels including the celiac, superior mesenteric and inferior mesenteric arteries are patent. The common iliac vessels as well as the internal and external iliac vessels are patent. Other: Prostate enlarged. Musculoskeletal: No aggressive or acute appearing osseous lesions. Review of the MIP images confirms the above findings. IMPRESSION: There is irregularity and focal narrowing of the left renal artery with with surrounding fat stranding. There is some motion artifact limiting evaluation of this location. Overall findings are nonspecific however the top differential considerations would be focal left renal artery dissection or vasculitis. Additional but less likely consideration would be left renal artery infection in the setting of pyelonephritis. On single phase contrast-enhanced exam, the left kidney appears perfused. Critical Value/emergent results were called by telephone at the time of interpretation on 03/12/2015 at 3:28 pm to Dr. WEulis Foster who verbally acknowledged these results. Electronically Signed   By: DLovey NewcomerM.D.   On: 03/12/2015 15:30     CBC  Recent Labs Lab 03/12/15 1335  03/13/15 0710 03/14/15 08119  03/15/15 0239 03/16/15 0353 03/17/15 0432  WBC 10.9*  < > 14.5* 13.3* 13.0* 13.2* 15.4*  HGB 15.5  < > 13.1 13.5 13.2 13.3 13.6  HCT 44.9   < > 38.5* 40.6 39.4 40.0 40.4  PLT 276  < > 239 268 271 279 292  MCV 84.9  < > 85.7 86.0 86.2 85.8 86.1  MCH 29.3  < > 29.2 28.6 28.9 28.5 29.0  MCHC 34.5  < > 34.0 33.3 33.5 33.3 33.7  RDW 12.1  < > 12.3 12.4 12.6 12.5 12.6  LYMPHSABS 2.4  --   --   --   --   --   --   MONOABS 0.5  --   --   --   --   --   --   EOSABS 0.6  --   --   --   --   --   --   BASOSABS 0.0  --   --   --   --   --   --   < > = values in this interval not displayed.  Chemistries   Recent Labs Lab 03/12/15 2115 03/13/15 0049 03/14/15 0412 03/15/15 0239 03/16/15 0353 03/17/15 0432  NA  --  136 136 137 137 133*  K  --  3.2* 3.6 3.5 3.2* 3.5  CL  --  100* 104 101 100* 96*  CO2  --  _0 GLUCOSE  --  105* 105* 108* 96 127*  BUN  --  _1 CREATININE 2.15* 1.91* 1.91* 2.18* 2.33* 2.63*  CALCIUM  --  8.9 8.9 8.8* 9.2 9.4  MG 2.0  --   --   --   --   --   AST _2 --   ALT 15* 14* 14* 14* 14*  --   ALKPHOS 48 47 40 41 41  --   BILITOT 0.7 0.7 0.9 0.8 1.0  --    ------------------------------------------------------------------------------------------------------------------ estimated creatinine clearance is 37.4 mL/min (by C-G formula based on Cr of 2.63). ------------------------------------------------------------------------------------------------------------------ No results for input(s): HGBA1C in the last 72 hours. ------------------------------------------------------------------------------------------------------------------ No results for input(s): CHOL, HDL, LDLCALC, TRIG, CHOLHDL, LDLDIRECT in the last 72 hours. ------------------------------------------------------------------------------------------------------------------ No results for input(s): TSH, T4TOTAL, T3FREE, THYROIDAB in the last 72 hours.  Invalid input(s): FREET3 ------------------------------------------------------------------------------------------------------------------ No results for  input(s): VITAMINB12, FOLATE, FERRITIN, TIBC, IRON, RETICCTPCT in the last 72 hours.  Coagulation profile  Recent Labs Lab 03/15/15 1435 03/16/15 0353 03/17/15 0432  INR 1.17 1.22 1.44    No results for input(s): DDIMER in the last 72 hours.  Cardiac Enzymes  Recent Labs Lab 03/12/15 2115 03/13/15 0049 03/13/15 0710  TROPONINI <0.03 <0.03 <0.03   ------------------------------------------------------------------------------------------------------------------ Invalid input(s): POCBNP   CBG: No results for input(s): GLUCAP in the last 168 hours.     Studies: US Renal  03/15/2015  CLINICAL DATA:  Hypertension, suprapubic pain.  Pelvic pain. EXAM: RENAL / URINARY TRACT ULTRASOUND COMPLETE COMPARISON:  CT 03/14/2015 FINDINGS: Right Kidney: Length: 11.2 cm. Mildly increased echotexture throughout the right kidney. Small lower pole cyst measures 8 mm. No hydronephrosis. Left Kidney: Length: 11.7 cm. Mildly increased echotexture. 10 mm upper pole and 11 mm lower pole cyst. No hydronephrosis. Bladder: Bladder wall appears mildly thickened at 5 mm.  Prostate prominent. IMPRESSION: Mildly increased echotexture in the kidneys bilaterally suggesting chronic medical renal disease. No hydronephrosis. Bladder wall thickening, possibly related to some degree of bladder  outlet obstruction. Prostate mildly prominent. Electronically Signed   By: Rolm Baptise M.D.   On: 03/15/2015 16:38      Lab Results  Component Value Date   HGBA1C 5.0 03/12/2015   Lab Results  Component Value Date   LDLCALC 66 03/13/2015   CREATININE 2.63* 03/17/2015       Scheduled Meds: . amLODipine  5 mg Oral Daily  . folic acid  1 mg Oral Daily  . furosemide  40 mg Oral Daily  . hydrALAZINE  100 mg Oral 3 times per day  . labetalol  400 mg Oral BID  . mometasone-formoterol  2 puff Inhalation BID  . multivitamin with minerals  1 tablet Oral Daily  . omega-3 acid ethyl esters  1 g Oral BID  . sodium  chloride  3 mL Intravenous Q12H  . spironolactone  50 mg Oral BID  . tamsulosin  0.8 mg Oral QPC breakfast  . thiamine  100 mg Oral Daily  . warfarin  7.5 mg Oral ONCE-1800   Continuous Infusions: . heparin 1,800 Units/hr (03/17/15 2957)    Active Problems:   Hypertensive urgency   Hypertensive urgency, malignant   Marijuana use    Time spent: 45 minutes   [facility] Pager [phone]. If 7PM-7AM, please contact night-coverage at [email or web address], password [facility] 03/17/2015, 9:58 AM  LOS: 5 days

## 2015-03-17 NOTE — Progress Notes (Signed)
Subjective:  BP better- when was 129/75 felt it-  good UOP- - creatinine continuing to rise some Objective Vital signs in last 24 hours: Filed Vitals:   03/16/15 2030 03/17/15 0006 03/17/15 0603 03/17/15 0735  BP: 152/94 129/75 165/120 153/91  Pulse: 98 95 102 97  Temp: 98.5 F (36.9 C) 99.1 F (37.3 C) 99.3 F (37.4 C)   TempSrc: Oral Oral Oral   Resp: 18 18 18    Height:      Weight:      SpO2: 95% 93% 94%    Weight change:   Intake/Output Summary (Last 24 hours) at 03/17/15 1012 Last data filed at 03/17/15 1002  Gross per 24 hour  Intake 1254.1 ml  Output   1580 ml  Net -325.9 ml    Assessment/ Plan: Pt is a 44 y.o. yo male with known CKD and HTN- (possible renal artery issue diagnosed in past) who was admitted on 03/12/2015 with flank pain- imaging suspicious for renal artery dissection and malignant HTN  Assessment/Plan: 1. 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  There has now been some acute disruption of flow and likely dissection/thrombus of that artery giving [place] this picture.  I agree that intervention on this can be tricky BUT if we are not able to achieve BP control it may be our only option- kidney size indicates that this kidney is salvageable 2. Renal - A on CRF in the setting of 2 dye procedures and malignant HTN.  U/S neg for obstruction and U/A not indicative of intrarenal process.  Hoping that as more time from dye and as BP gets better the renal function will settle out 3. HTN- malignant.  I have made medication change every day.  Now on hydralazine 100 TID, labetalol 400 BID, aldactone 50 BID.  norvasc 5 daily and lasix 40 daily- no change today.  If remains overcontrolled would stop norvasc - goal is likelya round 140/90 at this time   Brittan Mapel A    Labs: Basic Metabolic Panel:  Recent Labs Lab 03/15/15 0239  03/16/15 0353 03/17/15 0432  NA 137 137 133*  K 3.5 3.2* 3.5  CL 101 100* 96*  CO2 23 27 28   GLUCOSE 108* 96 127*  BUN 13 11 15   CREATININE 2.18* 2.33* 2.63*  CALCIUM 8.8* 9.2 9.4  PHOS  --   --  3.8   Liver Function Tests:  Recent Labs Lab 03/14/15 0412 03/15/15 0239 03/16/15 0353 03/17/15 0432  AST 16 17 21   --   ALT 14* 14* 14*  --   ALKPHOS 40 41 41  --   BILITOT 0.9 0.8 1.0  --   PROT 7.1 6.8 7.1  --   ALBUMIN 3.2* 3.5 3.3* 3.4*   No results for input(s): LIPASE, AMYLASE in the last 168 hours. No results for input(s): AMMONIA in the last 168 hours. CBC:  Recent Labs Lab 03/12/15 1335  03/13/15 0710 03/14/15 0412 03/15/15 0239 03/16/15 0353 03/17/15 0432  WBC 10.9*  < > 14.5* 13.3* 13.0* 13.2* 15.4*  NEUTROABS 7.3  --   --   --   --   --   --   HGB 15.5  < > 13.1 13.5 13.2 13.3 13.6  HCT 44.9  < > 38.5* 40.6 39.4 40.0 40.4  MCV 84.9  < > 85.7 86.0 86.2 85.8  86.1  PLT 276  < > 239 268 271 279 292  < > = values in this interval not displayed. Cardiac Enzymes:  Recent Labs Lab 03/12/15 2115 03/13/15 0049 03/13/15 0710  TROPONINI <0.03 <0.03 <0.03   CBG: No results for input(s): GLUCAP in the last 168 hours.  Iron Studies: No results for input(s): IRON, TIBC, TRANSFERRIN, FERRITIN in the last 72 hours. Studies/Results: US Renal  03/15/2015  CLINICAL DATA:  Hypertension, suprapubic pain.  Pelvic pain. EXAM: RENAL / URINARY TRACT ULTRASOUND COMPLETE COMPARISON:  CT 03/14/2015 FINDINGS: Right Kidney: Length: 11.2 cm. Mildly increased echotexture throughout the right kidney. Small lower pole cyst measures 8 mm. No hydronephrosis. Left Kidney: Length: 11.7 cm. Mildly increased echotexture. 10 mm upper pole and 11 mm lower pole cyst. No hydronephrosis. Bladder: Bladder wall appears mildly thickened at 5 mm.  Prostate prominent. IMPRESSION: Mildly increased echotexture in the kidneys bilaterally suggesting chronic medical renal disease. No hydronephrosis.  Bladder wall thickening, possibly related to some degree of bladder outlet obstruction. Prostate mildly prominent. Electronically Signed   By: Rolm Baptise M.D.   On: 03/15/2015 16:38   Medications: Infusions: . heparin 1,800 Units/hr (03/17/15 0733)    Scheduled Medications: . amLODipine  5 mg Oral Daily  . folic acid  1 mg Oral Daily  . furosemide  40 mg Oral Daily  . hydrALAZINE  100 mg Oral 3 times per day  . labetalol  400 mg Oral BID  . mometasone-formoterol  2 puff Inhalation BID  . multivitamin with minerals  1 tablet Oral Daily  . omega-3 acid ethyl esters  1 g Oral BID  . sodium chloride  3 mL Intravenous Q12H  . spironolactone  50 mg Oral BID  . tamsulosin  0.8 mg Oral QPC breakfast  . thiamine  100 mg Oral Daily  . warfarin  7.5 mg Oral ONCE-1800    have reviewed scheduled and prn medications.  Physical Exam: General: NAD Heart:RRR Lungs: mostly clr Abdomen: soft, non tender Extremities: no edema    03/17/2015,10:12 AM  LOS: 5 days

## 2015-03-18 LAB — RENAL FUNCTION PANEL
Albumin: 3.6 g/dL (ref 3.5–5.0)
Anion gap: 11 (ref 5–15)
BUN: 22 mg/dL — ABNORMAL HIGH (ref 6–20)
CO2: 25 mmol/L (ref 22–32)
Calcium: 9.6 mg/dL (ref 8.9–10.3)
Chloride: 97 mmol/L — ABNORMAL LOW (ref 101–111)
Creatinine, Ser: 2.8 mg/dL — ABNORMAL HIGH (ref 0.61–1.24)
GFR calc Af Amer: 30 mL/min — ABNORMAL LOW (ref >60)
GFR calc non Af Amer: 25 mL/min — ABNORMAL LOW (ref >60)
Glucose, Bld: 112 mg/dL — ABNORMAL HIGH (ref 65–99)
Phosphorus: 5.4 mg/dL — ABNORMAL HIGH (ref 2.5–4.6)
Potassium: 3.5 mmol/L (ref 3.5–5.1)
Sodium: 133 mmol/L — ABNORMAL LOW (ref 135–145)

## 2015-03-18 LAB — CBC
HCT: 40 % (ref 39.0–52.0)
Hemoglobin: 13.9 g/dL (ref 13.0–17.0)
MCH: 29.3 pg (ref 26.0–34.0)
MCHC: 34.8 g/dL (ref 30.0–36.0)
MCV: 84.4 fL (ref 78.0–100.0)
Platelets: 294 10*3/uL (ref 150–400)
RBC: 4.74 MIL/uL (ref 4.22–5.81)
RDW: 12.2 % (ref 11.5–15.5)
WBC: 14.8 10*3/uL — ABNORMAL HIGH (ref 4.0–10.5)

## 2015-03-18 LAB — PROTIME-INR
INR: 1.63 — AB (ref 0.00–1.49)
Prothrombin Time: 19.3 seconds — ABNORMAL HIGH (ref 11.6–15.2)

## 2015-03-18 LAB — HEPARIN LEVEL (UNFRACTIONATED): Heparin Unfractionated: 0.46 IU/mL (ref 0.30–0.70)

## 2015-03-18 MED ORDER — WARFARIN SODIUM 7.5 MG PO TABS
7.5000 mg | ORAL_TABLET | Freq: Every day | ORAL | Status: DC
  Administered 2015-03-18: 7.5 mg via ORAL
  Filled 2015-03-18: qty 1

## 2015-03-18 MED ORDER — LACTULOSE 10 GM/15ML PO SOLN
10.0000 g | Freq: Two times a day (BID) | ORAL | Status: DC
  Administered 2015-03-18 – 2015-03-19 (×2): 10 g via ORAL
  Filled 2015-03-18 (×2): qty 15

## 2015-03-18 MED ORDER — FLEET ENEMA 7-19 GM/118ML RE ENEM
1.0000 | ENEMA | Freq: Once | RECTAL | Status: AC
  Administered 2015-03-18: 1 via RECTAL
  Filled 2015-03-18: qty 1

## 2015-03-18 MED ORDER — OXYCODONE HCL 5 MG PO TABS
5.0000 mg | ORAL_TABLET | Freq: Four times a day (QID) | ORAL | Status: DC | PRN
  Administered 2015-03-18 – 2015-03-19 (×3): 5 mg via ORAL
  Filled 2015-03-18 (×4): qty 1

## 2015-03-18 MED ORDER — SENNA 8.6 MG PO TABS
2.0000 | ORAL_TABLET | Freq: Once | ORAL | Status: AC
  Administered 2015-03-18: 17.2 mg via ORAL

## 2015-03-18 MED ORDER — WARFARIN - PHARMACIST DOSING INPATIENT
Freq: Every day | Status: DC
  Administered 2015-03-18: 18:00:00

## 2015-03-18 MED ORDER — POLYETHYLENE GLYCOL 3350 17 G PO PACK
17.0000 g | PACK | Freq: Every day | ORAL | Status: DC
  Administered 2015-03-18 – 2015-03-19 (×2): 17 g via ORAL
  Filled 2015-03-18: qty 1

## 2015-03-18 NOTE — Care Management Note (Signed)
Case Management Note  Patient Details  Name: Dylan Sweeney MRN: [id number] Date of Birth: [birth year]  Subjective/Objective:                  Date-03-18-15 Initial Assessment Spoke with patient at the bedside Introduced self as case manager and explained role in discharge planning and how to be reached.  Verified patient lives at home with wife Verified patient anticipates to go home with spouse, at time of discharge.  Patient has no DME. Expressed potential need for no other DME.  Patient denied  needing help with their medication.  Gets medications from [place], will plan on using local pharmacy for new medications until they can be supplied through [place]. Patient drives  to MD appointments.  Verified patient has PCP  At [facility] unsure of name.  Plan: CM will continue to follow for discharge planning and [facility] resources.   Carles Collet RN BSN CM [phone]   Action/Plan:   Expected Discharge Date:                  Expected Discharge Plan:  Home/Self Care  In-House Referral:     Discharge planning Services  CM Consult  Post Acute Care Choice:    Choice offered to:     DME Arranged:    DME Agency:     HH Arranged:    HH Agency:     Status of Service:  In process, will continue to follow  Medicare Important Message Given:    Date Medicare IM Given:    Medicare IM give by:    Date Additional Medicare IM Given:    Additional Medicare Important Message give by:     If discussed at [place] of Stay Meetings, dates discussed:    Additional Comments:  Carles Collet, RN 03/18/2015, 10:50 AM

## 2015-03-18 NOTE — Progress Notes (Signed)
Subjective:  Pressures better since adding norvasc. Has been on lasix with good urine output x 2 days. Creatinine rising. No difficulty urinating any more. He says that he has had some abdominal cramps this am. Got an enema, but did not have a BM with enema. Last BM prior to admission.   Objective Vital signs in last 24 hours: Filed Vitals:   03/17/15 2016 03/17/15 2140 03/18/15 0002 03/18/15 0500  BP:  147/94 132/86 154/99  Pulse:  95 92 89  Temp:  98.3 F (36.8 C) 98.8 F (37.1 C) 97.4 F (36.3 C)  TempSrc:  Oral Oral Oral  Resp:    18  Height:      Weight:      SpO2: 94% 96% 99% 96%   Weight change:   Intake/Output Summary (Last 24 hours) at 03/18/15 1005 Last data filed at 03/18/15 0546  Gross per 24 hour  Intake 1074.7 ml  Output   1940 ml  Net -865.3 ml    Assessment/ Plan: Pt is a 44 y.o. yo male with known CKD and HTN- (possible renal artery issue diagnosed in past) who was admitted on 03/12/2015 with flank pain- imaging suspicious for renal artery dissection and malignant HTN  Assessment/Plan: 1. 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  There has now been some acute disruption of flow and likely dissection/thrombus of that artery giving [place] this picture.  I agree that intervention on this can be tricky BUT if we are not able to achieve BP control it may be our only option- kidney size indicates that this kidney is salvageable.  - On Heparin bridge to Coumadin. INR coming up.  - Per Vascular 3 months of anticoagulation then transition to antiplatelet.  - Follow up CTA in 3 months.  2. Renal - A on CRF (CKD II-III) in the setting of 2 dye procedures and malignant HTN.  U/S neg for obstruction and U/A not indicative of intrarenal process.  Hoping that as more time from dye and as BP gets better the renal function will settle out.  - Discontinued Lasix  this am.  - Continue to trend Creatinine / Daily renal function panel.  3. HTN- malignant.  I have made medication change every day.  Now on hydralazine 100 TID, labetalol 400 BID, aldactone 50 BID.  Norvasc 5 daily -  Discontinued lasix today.  Will follow BP's after stopping lasix. Hopefully the four above will be enough. May eventually need management of Renal artery to definitively control this problem.  4. Constipation - Avoid Magnesium or Phosphorous based cathartics. Could use Sorbitol.     Everhett Bozard    Labs: Basic Metabolic Panel:  Recent Labs Lab 03/16/15 0353 03/17/15 0432 03/18/15 0558  NA 137 133* 133*  K 3.2* 3.5 3.5  CL 100* 96* 97*  CO2 27 28 25   GLUCOSE 96 127* 112*  BUN 11 15 22*  CREATININE 2.33* 2.63* 2.80*  CALCIUM 9.2 9.4 9.6  PHOS  --  3.8 5.4*   Liver Function Tests:  Recent Labs Lab 03/14/15 0412 03/15/15 0239 03/16/15 0353 03/17/15 0432 03/18/15 0558  AST 16 17 21   --   --   ALT 14* 14* 14*  --   --   ALKPHOS 40 41 41  --   --   BILITOT 0.9 0.8 1.0  --   --  PROT 7.1 6.8 7.1  --   --   ALBUMIN 3.2* 3.5 3.3* 3.4* 3.6   No results for input(s): LIPASE, AMYLASE in the last 168 hours. No results for input(s): AMMONIA in the last 168 hours. CBC:  Recent Labs Lab 03/12/15 1335  03/13/15 0710 03/14/15 0412 03/15/15 0239 03/16/15 0353 03/17/15 0432  WBC 10.9*  < > 14.5* 13.3* 13.0* 13.2* 15.4*  NEUTROABS 7.3  --   --   --   --   --   --   HGB 15.5  < > 13.1 13.5 13.2 13.3 13.6  HCT 44.9  < > 38.5* 40.6 39.4 40.0 40.4  MCV 84.9  < > 85.7 86.0 86.2 85.8 86.1  PLT 276  < > 239 268 271 279 292  < > = values in this interval not displayed. Cardiac Enzymes:  Recent Labs Lab 03/12/15 2115 03/13/15 0049 03/13/15 0710  TROPONINI <0.03 <0.03 <0.03   CBG: No results for input(s): GLUCAP in the last 168 hours.  Iron Studies: No results for input(s): IRON, TIBC, TRANSFERRIN, FERRITIN in the last 72 hours. Studies/Results: No  results found. Medications: Infusions: . heparin 1,800 Units/hr (03/17/15 2130)    Scheduled Medications: . amLODipine  5 mg Oral Daily  . docusate sodium  100 mg Oral BID  . folic acid  1 mg Oral Daily  . hydrALAZINE  100 mg Oral 3 times per day  . labetalol  400 mg Oral BID  . mometasone-formoterol  2 puff Inhalation BID  . multivitamin with minerals  1 tablet Oral Daily  . omega-3 acid ethyl esters  1 g Oral BID  . polyethylene glycol  17 g Oral Daily  . sodium chloride  3 mL Intravenous Q12H  . spironolactone  50 mg Oral BID  . tamsulosin  0.8 mg Oral QPC breakfast  . thiamine  100 mg Oral Daily    have reviewed scheduled and prn medications.  Physical Exam: General: NAD, resting in bed Heart:RRR, No MGR, normal S1/S2 Lungs: CTA Bilaterally, appropriate rate, unlabored.  Abdomen: soft, non tender, No abdominal bruits.  Extremities: WWP, no edema.     03/18/2015,10:05 AM  LOS: 6 days

## 2015-03-18 NOTE — Discharge Instructions (Signed)

## 2015-03-18 NOTE — Progress Notes (Addendum)
[facility] PROGRESS NOTE  Dylan Sweeney WER:[id number] DOB: 09/23/1970 DOA: 03/12/2015 PCP: No primary care provider on file.  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  Continue PRN labetalol. 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 Unable to clearly identify a dissection, however this is still possible. needs  full anticoagulation for 3 months and then transition to anti-platelet therapy, now on heparin drip and Coumadin Continue heparin until INR therapeutic Cont  telemetry Patient may have underlying fibromuscular dysplasia or focal renal artery stenosis Dr. Trula Slade reassess tomorrow, likely cannot have an angiogram given worsening renal function Follow up CTA in 3 months Stat cbc due to flank pain that started last night , if drop in hg will order CT wo contrast to r/o retroperitoneal bleed   .AKI on CKD II - baseline creatinine 1.7. Cr - 1.8 , continues to worsen, creatinine now 2.80.  Dylan Sweeney Kitchen Should hopefully come down  with clearance of contrast and BP control. He received Contrast for CTA  Twice .  Off  normal saline at 75 mL per hour,   Likely combination of HTN, and IV contrast.   Marijuana use-counseling done  Asthma-continue Advair, albuterol, stable  Alcohol abuse stable on CIWA protocol, did not witness any signs of alcohol withdrawal during this admission  BPH? Increased Flomax.   DVT prophylaxsis heparin drip  Code Status:      Code Status Orders        Start     Ordered   03/12/15 1917  Full code   Continuous     03/12/15 1918     Family Communication: family updated about patient's clinical progress Disposition Plan:  2-3 days , when INR is therapeutic  Brief narrative: 44 year old male with a history of chronic kidney disease stage II followed at the [facility] in [place], hypertension, asthma which is under control, presents to the ER with left flank pain. Blood pressure upon arrival was 222/148. Flank pain is described as dull, constant pain, not particularly worse with movement, not reproducible to palpation. Denies any history of nephrolithiasis. Patient received 5 doses of IV labetalol, subsequently started on a nitroglycerin drip. CT scan which was performed to rule out dissection showed an irregularity and focal narrowing of the left renal artery with surrounding fat stranding. Findings nonspecific but ddx include renal artery dissection or vasculitis. Patient has a history of sickle cell trait, admits to using marijuana, denies cocaine use, drinks beer on a daily basis.    Consultants:  Nephrology  Vascular surgery  Procedures:  None none  Antibiotics: Anti-infectives  None         HPI/Subjective: Pressures better since adding norvasc. Has been on lasix with good urine output x 2 days. Creatinine rising. Flank pain since yesterday  Objective: Filed Vitals:   03/17/15 2016 03/17/15 2140 03/18/15 0002 03/18/15 0500  BP:  147/94 132/86 154/99   Pulse:  95 92 89  Temp:  98.3 F (36.8 C) 98.8 F (37.1 C) 97.4 F (36.3 C)  TempSrc:  Oral Oral Oral  Resp:    18  Height:      Weight:      SpO2: 94% 96% 99% 96%    Intake/Output Summary (Last 24 hours) at 03/18/15 1201 Last data filed at 03/18/15 0753  Gross per 24 hour  Intake 1194.7 ml  Output   1940 ml  Net -745.3 ml    Exam:  General: NAD, AAOx3, resting in bed.  Cardiovascular: RRR, no MGR, normal S1/S2, 2+ distal pulses.  Respiratory: CTA Bilaterally, appropriate rate, unlabored.  Abdomen: S, NT, ND, No abdominal bruits audible. +BS.  Extremities: WWP, 2+ distal pulses, no edema, MAEW.  Data Review   Micro Results Recent Results (from the past 240 hour(s))  MRSA PCR Screening     Status: None   Collection Time: 03/12/15 10:47 PM  Result Value Ref Range Status   MRSA by PCR NEGATIVE NEGATIVE Final    Comment:        The GeneXpert MRSA Assay (FDA approved for NASAL specimens only), is one component of a comprehensive MRSA colonization surveillance program. It is not intended to diagnose MRSA infection nor to guide or monitor treatment for MRSA infections.     Radiology Reports US Renal  03/15/2015  CLINICAL DATA:  Hypertension, suprapubic pain.  Pelvic pain. EXAM: RENAL / URINARY TRACT ULTRASOUND COMPLETE COMPARISON:  CT 03/14/2015 FINDINGS: Right Kidney: Length: 11.2 cm. Mildly increased echotexture throughout the right kidney. Small lower pole cyst measures 8 mm. No hydronephrosis. Left Kidney: Length: 11.7 cm. Mildly increased echotexture. 10 mm upper pole and 11 mm lower pole cyst. No hydronephrosis. Bladder: Bladder wall appears mildly thickened at 5 mm.  Prostate prominent. IMPRESSION: Mildly increased echotexture in the kidneys bilaterally suggesting chronic medical renal disease. No hydronephrosis. Bladder wall thickening, possibly related to some degree of bladder outlet obstruction. Prostate mildly prominent. Electronically Signed   By: Rolm Baptise M.D.   On: 03/15/2015 16:38   Ct Angio Chest Aorta W/cm &/or Wo/cm  03/12/2015  CLINICAL DATA:  Patient with back pain. Evaluate for aortic dissection. EXAM: CT ANGIOGRAPHY CHEST, ABDOMEN AND PELVIS TECHNIQUE: Multidetector CT imaging of the chest, abdomen and pelvis was performed using the standard protocol during bolus administration of intravenous contrast. Multiplanar CT image reconstructions and MIPs were obtained to evaluate the vascular anatomy. CONTRAST:  67m OMNIPAQUE IOHEXOL 350 MG/ML SOLN COMPARISON:  None. FINDINGS: CTA CHEST FINDINGS Mediastinum/Nodes: Noncontrast imaging through the thorax demonstrates no peripheral high attenuation within the thoracic aorta to suggest intramural hematoma formation. Post contrast-enhanced images demonstrate motion artifact limiting evaluation of the aortic root. The thoracic aorta is opacified with contrast material. No evidence for acute thoracic aortic dissection. The takeoff of the great vessels are patent. Visualized thyroid is unremarkable. No enlarged axillary, mediastinal or hilar lymphadenopathy. Heart is normal in size. No pericardial effusion. Aorta and main pulmonary artery normal in caliber. No evidence for central pulmonary embolism. Coronary arterial vascular calcifications. Lungs/Pleura: The central airways are patent. No consolidative pulmonary opacities. No pleural effusion or pneumothorax. Review of the  MIP images confirms the above findings. CTA ABDOMEN FINDINGS Hepatobiliary: Liver is normal in size and contour. No focal hepatic lesion is identified. The gallbladder is unremarkable. No intrahepatic or extrahepatic biliary ductal dilatation. Pancreas: Unremarkable Spleen: Unremarkable Adrenals/Urinary Tract: The adrenal glands are normal. The kidneys enhance symmetrically with contrast. Multiple bilateral low-attenuation subcentimeter renal lesions are demonstrated, too small to accurately characterize. There is circumferential wall  thickening of the urinary bladder. Stomach/Bowel: No abnormal bowel wall thickening or evidence for bowel obstruction. No free fluid or free intraperitoneal air. No evidence for bowel obstruction. Vascular/Lymphatic: The abdominal aorta is patent and normal in caliber. There is fat stranding about the left renal artery with apparent focal areas of narrowing, demonstrated best on sagittal images (image 103; series 503). The branch vessels including the celiac, superior mesenteric and inferior mesenteric arteries are patent. The common iliac vessels as well as the internal and external iliac vessels are patent. Other: Prostate enlarged. Musculoskeletal: No aggressive or acute appearing osseous lesions. Review of the MIP images confirms the above findings. IMPRESSION: There is irregularity and focal narrowing of the left renal artery with with surrounding fat stranding. There is some motion artifact limiting evaluation of this location. Overall findings are nonspecific however the top differential considerations would be focal left renal artery dissection or vasculitis. Additional but less likely consideration would be left renal artery infection in the setting of pyelonephritis. On single phase contrast-enhanced exam, the left kidney appears perfused. Critical Value/emergent results were called by telephone at the time of interpretation on 03/12/2015 at 3:28 pm to Dr. Eulis Foster, who verbally acknowledged these results. Electronically Signed   By: Lovey Newcomer M.D.   On: 03/12/2015 15:30   Ct Angio Abd/pel W/ And/or W/o  03/14/2015  CLINICAL DATA:  RULE OUT DISSECTION, PREVIOUS SCAN SUBOPTIMAL DUE TO MOTION ARTIFACT AND POOR CONTRAST BOLUS ENHANCEMENT CONCERN FOR LEFT RENAL ARTERY PATHOLOGY LOWER LEFT SIDE BACK PAIN SINCE Friday LAST WEEK : CT ANGIOGRAPHY ABDOMEN AND PELVIS TECHNIQUE: Multidetector CT imaging of the abdomen and pelvis was performed using the standard protocol during bolus administration of intravenous  contrast. Multiplanar reconstructed images including MIPs were obtained and reviewed to evaluate the vascular anatomy. CONTRAST:  35m OMNIPAQUE IOHEXOL 350 MG/ML SOLN, reduced dose due to renal insufficiency COMPARISON:  COMPARISON 03/12/2015 FINDINGS: ARTERIAL FINDINGS: Aorta:                Normal Celiac axis:          Patent Superior mesenteric:  Patent, classic distal branch anatomy. Left renal: There are 3. In the superior left renal artery, there is Circumferential wall thickening and luminal narrowing through most of the artery, with a short segment of relative sparing Extending over length of less than 1 cm in the midportion. The circumferential wall thickening and luminal narrowing extends through the bifurcation to involve posterior and to a lesser degree anterior divisions. There are mild regional inflammatory changes. The more diminutive middle left renal artery is unremarkable. The inferior left renal artery is aberrant, arising from the aorta below the level of the IMA, widely patent and unremarkable. Right renal:          Single, unremarkable. Inferior mesenteric:  Patent Left iliac:           Unremarkable Right iliac:          Unremarkable Venous findings: Dedicated venous phase imaging not obtained. Circumaortic left renal vein is noted. Review of the MIP images confirms the above findings. Nonvascular findings:  Platelike atelectasis or scarring in the lateral basal segment right lower lobe. Unremarkable arterial phase evaluation of liver, nondilated gallbladder, spleen, adrenal glands, pancreas. Probable cyst in the lower pole left kidney as before, incompletely characterized. Stomach, small bowel, and colon are nondilated. Normal appendix. A few scattered descending and sigmoid diverticula without adjacent inflammatory/ edematous change. Urinary bladder is somewhat thick walled, physiologically distended. Moderate prostatic enlargement. No ascites. No free air. IMPRESSION: 1. Long segment wall  thickening and luminal narrowing in the superior left renal artery extending into anterior posterior divisions. The findings are nonspecific and may represent atypical changes of fibromuscular dysplasia, large vessel arteritis (without any other evident areas of involvement), possibly with superimposed dissection. 2. Widely patent middle and aberrant inferior left renal arteries perfuse a significant portion of the left renal parenchyma. Electronically Signed   By: Lucrezia Europe M.D.   On: 03/14/2015 13:29   Ct Angio Abd/pel W/ And/or W/o  03/12/2015  CLINICAL DATA:  Patient with back pain. Evaluate for aortic dissection. EXAM: CT ANGIOGRAPHY CHEST, ABDOMEN AND PELVIS TECHNIQUE: Multidetector CT imaging of the chest, abdomen and pelvis was performed using the standard protocol during bolus administration of intravenous contrast. Multiplanar CT image reconstructions and MIPs were obtained to evaluate the vascular anatomy. CONTRAST:  25m OMNIPAQUE IOHEXOL 350 MG/ML SOLN COMPARISON:  None. FINDINGS: CTA CHEST FINDINGS Mediastinum/Nodes: Noncontrast imaging through the thorax demonstrates no peripheral high attenuation within the thoracic aorta to suggest intramural hematoma formation. Post contrast-enhanced images demonstrate motion artifact limiting evaluation of the aortic root. The thoracic aorta is opacified with contrast material. No evidence for acute thoracic aortic dissection. The takeoff of the great vessels are patent. Visualized thyroid is unremarkable. No enlarged axillary, mediastinal or hilar lymphadenopathy. Heart is normal in size. No pericardial effusion. Aorta and main pulmonary artery normal in caliber. No evidence for central pulmonary embolism. Coronary arterial vascular calcifications. Lungs/Pleura: The central airways are patent. No consolidative pulmonary opacities. No pleural effusion or pneumothorax. Review of the MIP images confirms the above findings. CTA ABDOMEN FINDINGS Hepatobiliary:  Liver is normal in size and contour. No focal hepatic lesion is identified. The gallbladder is unremarkable. No intrahepatic or extrahepatic biliary ductal dilatation. Pancreas: Unremarkable Spleen: Unremarkable Adrenals/Urinary Tract: The adrenal glands are normal. The kidneys enhance symmetrically with contrast. Multiple bilateral low-attenuation subcentimeter renal lesions are demonstrated, too small to accurately characterize. There is circumferential wall thickening of the urinary bladder. Stomach/Bowel: No abnormal bowel wall thickening or evidence for bowel obstruction. No free fluid or free intraperitoneal air. No evidence for bowel obstruction. Vascular/Lymphatic: The abdominal aorta is patent and normal in caliber. There is fat stranding about the left renal artery with apparent focal areas of narrowing, demonstrated best on sagittal images (image 103; series 503). The branch vessels including the celiac, superior mesenteric and inferior mesenteric arteries are patent. The common iliac vessels as well as the internal and external iliac vessels are patent. Other: Prostate enlarged. Musculoskeletal: No aggressive or acute appearing osseous lesions. Review of the MIP images confirms the above findings. IMPRESSION: There is irregularity and focal narrowing of the left renal artery with with surrounding fat stranding. There is some motion artifact limiting evaluation of this location. Overall findings are nonspecific however the top differential considerations would be focal left renal artery dissection or vasculitis. Additional but less likely consideration would be left renal artery infection in the setting of pyelonephritis. On single phase contrast-enhanced exam, the left kidney appears perfused. Critical Value/emergent results were called by telephone  at the time of interpretation on 03/12/2015 at 3:28 pm to Dr. Eulis Foster, who verbally acknowledged these results. Electronically Signed   By: Lovey Newcomer M.D.    On: 03/12/2015 15:30     CBC  Recent Labs Lab 03/12/15 1335  03/13/15 0710 03/14/15 0412 03/15/15 0239 03/16/15 0353 03/17/15 0432  WBC 10.9*  < > 14.5* 13.3* 13.0* 13.2* 15.4*  HGB 15.5  < > 13.1 13.5 13.2 13.3 13.6  HCT 44.9  < > 38.5* 40.6 39.4 40.0 40.4  PLT 276  < > 239 268 271 279 292  MCV 84.9  < > 85.7 86.0 86.2 85.8 86.1  MCH 29.3  < > 29.2 28.6 28.9 28.5 29.0  MCHC 34.5  < > 34.0 33.3 33.5 33.3 33.7  RDW 12.1  < > 12.3 12.4 12.6 12.5 12.6  LYMPHSABS 2.4  --   --   --   --   --   --   MONOABS 0.5  --   --   --   --   --   --   EOSABS 0.6  --   --   --   --   --   --   BASOSABS 0.0  --   --   --   --   --   --   < > = values in this interval not displayed.  Chemistries   Recent Labs Lab 03/12/15 2115  03/13/15 0049 03/14/15 0412 03/15/15 0239 03/16/15 0353 03/17/15 0432 03/18/15 0558  NA  --   --  136 136 137 137 133* 133*  K  --   --  3.2* 3.6 3.5 3.2* 3.5 3.5  CL  --   --  100* 104 101 100* 96* 97*  CO2  --   < > 28 26 23 27 28 25   GLUCOSE  --   --  105* 105* 108* 96 127* 112*  BUN  --   --  17 11 13 11 15  22*  CREATININE 2.15*  --  1.91* 1.91* 2.18* 2.33* 2.63* 2.80*  CALCIUM  --   < > 8.9 8.9 8.8* 9.2 9.4 9.6  MG 2.0  --   --   --   --   --   --   --   AST 18  --  17 16 17 21   --   --   ALT 15*  --  14* 14* 14* 14*  --   --   ALKPHOS 48  --  47 40 41 41  --   --   BILITOT 0.7  --  0.7 0.9 0.8 1.0  --   --   < > = values in this interval not displayed. ------------------------------------------------------------------------------------------------------------------ estimated creatinine clearance is 35.1 mL/min (by C-G formula based on Cr of 2.8). ------------------------------------------------------------------------------------------------------------------ No results for input(s): HGBA1C in the last 72 hours. ------------------------------------------------------------------------------------------------------------------ No results for input(s):  CHOL, HDL, LDLCALC, TRIG, CHOLHDL, LDLDIRECT in the last 72 hours. ------------------------------------------------------------------------------------------------------------------ No results for input(s): TSH, T4TOTAL, T3FREE, THYROIDAB in the last 72 hours.  Invalid input(s): FREET3 ------------------------------------------------------------------------------------------------------------------ No results for input(s): VITAMINB12, FOLATE, FERRITIN, TIBC, IRON, RETICCTPCT in the last 72 hours.  Coagulation profile  Recent Labs Lab 03/15/15 1435 03/16/15 0353 03/17/15 0432 03/18/15 0558  INR 1.17 1.22 1.44 1.63*    No results for input(s): DDIMER in the last 72 hours.  Cardiac Enzymes  Recent Labs Lab 03/12/15 2115 03/13/15 0049 03/13/15 0710  TROPONINI <0.03 <0.03 <0.03   ------------------------------------------------------------------------------------------------------------------ Mertha Finders  input(s): POCBNP   CBG: No results for input(s): GLUCAP in the last 168 hours.     Studies: No results found.    Lab Results  Component Value Date   HGBA1C 5.0 03/12/2015   Lab Results  Component Value Date   LDLCALC 66 03/13/2015   CREATININE 2.80* 03/18/2015       Scheduled Meds: . amLODipine  5 mg Oral Daily  . docusate sodium  100 mg Oral BID  . folic acid  1 mg Oral Daily  . hydrALAZINE  100 mg Oral 3 times per day  . labetalol  400 mg Oral BID  . mometasone-formoterol  2 puff Inhalation BID  . multivitamin with minerals  1 tablet Oral Daily  . omega-3 acid ethyl esters  1 g Oral BID  . polyethylene glycol  17 g Oral Daily  . sodium chloride  3 mL Intravenous Q12H  . spironolactone  50 mg Oral BID  . tamsulosin  0.8 mg Oral QPC breakfast  . thiamine  100 mg Oral Daily  . warfarin  7.5 mg Oral q1800  . Warfarin - Pharmacist Dosing Inpatient   Does not apply q1800   Continuous Infusions: . heparin 1,800 Units/hr (03/17/15 2130)    Active  Problems:   Hypertensive urgency   Hypertensive urgency, malignant   Marijuana use    Time spent: 45 minutes   [facility] Pager [phone]. If 7PM-7AM, please contact night-coverage at [email or web address], password [facility] 03/18/2015, 12:01 PM  LOS: 6 days

## 2015-03-18 NOTE — Progress Notes (Signed)
  Progress Note    03/18/2015 9:15 AM Hospital Day 6  Subjective:  C/o slight back pain last night; some abdominal pain that got a little better after enema  Afebrile x 24 hrs [id number] systolic HR [id number]  0000000 RA  Filed Vitals:   03/18/15 0500  BP: 154/99  Pulse: 89  Temp: 97.4 F (36.3 C)  Resp: 18    CBC    Component Value Date/Time   WBC 15.4* 03/17/2015 0432   RBC 4.69 03/17/2015 0432   HGB 13.6 03/17/2015 0432   HCT 40.4 03/17/2015 0432   PLT 292 03/17/2015 0432   MCV 86.1 03/17/2015 0432   MCH 29.0 03/17/2015 0432   MCHC 33.7 03/17/2015 0432   RDW 12.6 03/17/2015 0432   LYMPHSABS 2.4 03/12/2015 1335   MONOABS 0.5 03/12/2015 1335   EOSABS 0.6 03/12/2015 1335   BASOSABS 0.0 03/12/2015 1335    BMET    Component Value Date/Time   NA 133* 03/18/2015 0558   K 3.5 03/18/2015 0558   CL 97* 03/18/2015 0558   CO2 25 03/18/2015 0558   GLUCOSE 112* 03/18/2015 0558   BUN 22* 03/18/2015 0558   CREATININE 2.80* 03/18/2015 0558   CALCIUM 9.6 03/18/2015 0558   GFRNONAA 25* 03/18/2015 0558   GFRAA 30* 03/18/2015 0558    INR    Component Value Date/Time   INR 1.63* 03/18/2015 0558     Intake/Output Summary (Last 24 hours) at 03/18/15 0915 Last data filed at 03/18/15 0546  Gross per 24 hour  Intake 1194.7 ml  Output   2090 ml  Net -895.3 ml     Assessment/Plan:  44 y.o. male with possible left renal artery dissection Hospital Day 6  -pt with mild pain in back last night -continue strict BP control per renal-BP better over the last 24 hrs -renal function worsening over the past couple of days -c/o abdominal pain-being treated with laxatives/enema. Slightly better after enema -on heparin bridge to coumadin.  INR trending up nicely -CTA scheduled for 3 months and f/u with Dr. Zada Girt, PA-C [facility] [phone] 03/18/2015 9:15 AM  I agree with the above.  Creatinine trending up.  2.8 today, likely  secondary to contrast-induced nephropathy.  Continue with IV hydration.  Would not recommend acute intervention until creatinine resolves.  Annamarie Major

## 2015-03-18 NOTE — Progress Notes (Signed)
ANTICOAGULATION CONSULT NOTE - Follow up Consult  Pharmacy Consult for Heparin/Warfarin Indication: Renal artery dissection  Allergies  Allergen Reactions  . Other Other (See Comments)    G6 pill allergy: pill given before travel in [place]    Patient Measurements: Height: 5\' 11"  (180.3 cm) Weight: 187 lb 13.3 oz (85.2 kg) IBW/kg (Calculated) : 75.3  Heparin Wt: 85.2 kg   Vital Signs: Temp: 97.4 F (36.3 C) (11/14 0500) Temp Source: Oral (11/14 0500) BP: 154/99 mmHg (11/14 0500) Pulse Rate: 89 (11/14 0500)  Labs:  Recent Labs  03/16/15 0353 03/17/15 0432 03/18/15 0558  HGB 13.3 13.6  --   HCT 40.0 40.4  --   PLT 279 292  --   LABPROT 15.6* 17.6* 19.3*  INR 1.22 1.44 1.63*  HEPARINUNFRC 0.40 0.38 0.46  CREATININE 2.33* 2.63* 2.80*    Estimated Creatinine Clearance: 35.1 mL/min (by C-G formula based on Cr of 2.8).   Assessment: 82 YOM with hx CKD II and HTN on D#4 heparin bridge to coumadin for renal artery dissection. Heparin level = 0.46, remains therapeutic on 1800 units/hr. INR 1.63, trending up nicely on 7.5 mg coumadin daily. No new cbc today, No bleeding noted per chart.  VVS recs: coumadin x 3 months, then switch to aspirin or plavix.  Goal of Therapy:  Heparin level 0.3-0.7 units/ml Monitor platelets by anticoagulation protocol: Yes  INR 2-3   Plan:  Continue heparin drip at 1800 units/hr (18 ml/hr) Coumadin 7.5 mg po daily Daily HL, CBC, INR Coumadin education with [facility]  Maryanna Shape, PharmD, BCPS  Clinical Pharmacist  Pager: [phone]  03/18/2015 11:28 AM

## 2015-03-18 NOTE — Progress Notes (Signed)
Patient is complaining of pain 8/10 states that the Dilaudid is not helping but for 1 hour then he is hurting again.  Patient also said that the miralax that he has been given is not helping.  Patient requests something else to help him

## 2015-03-19 LAB — COMPREHENSIVE METABOLIC PANEL
ALT: 31 U/L (ref 17–63)
ANION GAP: 13 (ref 5–15)
AST: 24 U/L (ref 15–41)
Albumin: 3.5 g/dL (ref 3.5–5.0)
Alkaline Phosphatase: 55 U/L (ref 38–126)
BUN: 21 mg/dL — ABNORMAL HIGH (ref 6–20)
CHLORIDE: 94 mmol/L — AB (ref 101–111)
CO2: 24 mmol/L (ref 22–32)
Calcium: 9.7 mg/dL (ref 8.9–10.3)
Creatinine, Ser: 2.7 mg/dL — ABNORMAL HIGH (ref 0.61–1.24)
GFR calc non Af Amer: 27 mL/min — ABNORMAL LOW (ref >60)
GFR, EST AFRICAN AMERICAN: 31 mL/min — AB (ref >60)
Glucose, Bld: 96 mg/dL (ref 65–99)
Potassium: 4 mmol/L (ref 3.5–5.1)
SODIUM: 131 mmol/L — AB (ref 135–145)
Total Bilirubin: 1 mg/dL (ref 0.3–1.2)
Total Protein: 8 g/dL (ref 6.5–8.1)

## 2015-03-19 LAB — CBC
HCT: 40.3 % (ref 39.0–52.0)
Hemoglobin: 13.8 g/dL (ref 13.0–17.0)
MCH: 29.2 pg (ref 26.0–34.0)
MCHC: 34.2 g/dL (ref 30.0–36.0)
MCV: 85.2 fL (ref 78.0–100.0)
PLATELETS: 350 10*3/uL (ref 150–400)
RBC: 4.73 MIL/uL (ref 4.22–5.81)
RDW: 12.4 % (ref 11.5–15.5)
WBC: 16.3 10*3/uL — ABNORMAL HIGH (ref 4.0–10.5)

## 2015-03-19 LAB — PROTIME-INR
INR: 2.48 — AB (ref 0.00–1.49)
Prothrombin Time: 26.5 seconds — ABNORMAL HIGH (ref 11.6–15.2)

## 2015-03-19 LAB — HEPARIN LEVEL (UNFRACTIONATED): Heparin Unfractionated: 0.53 IU/mL (ref 0.30–0.70)

## 2015-03-19 MED ORDER — DOCUSATE SODIUM 100 MG PO CAPS: 100.0000 mg | ORAL_CAPSULE | Freq: Two times a day (BID) | ORAL | Status: DC

## 2015-03-19 MED ORDER — WARFARIN SODIUM 5 MG PO TABS: 5.0000 mg | ORAL_TABLET | Freq: Every day | ORAL | Status: DC

## 2015-03-19 MED ORDER — LABETALOL HCL 200 MG PO TABS: 400.0000 mg | ORAL_TABLET | Freq: Two times a day (BID) | ORAL | Status: DC

## 2015-03-19 MED ORDER — SPIRONOLACTONE 50 MG PO TABS: 50.0000 mg | ORAL_TABLET | Freq: Two times a day (BID) | ORAL | Status: DC

## 2015-03-19 MED ORDER — AMLODIPINE BESYLATE 10 MG PO TABS
10.0000 mg | ORAL_TABLET | Freq: Every day | ORAL | Status: DC
  Administered 2015-03-19: 10 mg via ORAL
  Filled 2015-03-19: qty 1

## 2015-03-19 MED ORDER — FOLIC ACID 1 MG PO TABS
1.0000 mg | ORAL_TABLET | Freq: Every day | ORAL | Status: DC
Start: 2015-03-19 — End: 2024-03-10

## 2015-03-19 MED ORDER — OXYCODONE HCL 5 MG PO TABS: 5.0000 mg | ORAL_TABLET | Freq: Four times a day (QID) | ORAL | Status: DC | PRN

## 2015-03-19 MED ORDER — TAMSULOSIN HCL 0.4 MG PO CAPS: 0.8000 mg | ORAL_CAPSULE | Freq: Every day | ORAL | Status: DC

## 2015-03-19 MED ORDER — AMLODIPINE BESYLATE 10 MG PO TABS: 10.0000 mg | ORAL_TABLET | Freq: Every day | ORAL | Status: DC

## 2015-03-19 MED ORDER — THIAMINE HCL 100 MG PO TABS: 100.0000 mg | ORAL_TABLET | Freq: Every day | ORAL | Status: DC

## 2015-03-19 MED ORDER — POLYETHYLENE GLYCOL 3350 17 G PO PACK: 17.0000 g | PACK | Freq: Every day | ORAL | Status: DC

## 2015-03-19 MED ORDER — WARFARIN SODIUM 5 MG PO TABS: 5.0000 mg | ORAL_TABLET | Freq: Once | ORAL | Status: DC

## 2015-03-19 MED ORDER — HYDRALAZINE HCL 100 MG PO TABS: 100.0000 mg | ORAL_TABLET | Freq: Three times a day (TID) | ORAL | Status: DC

## 2015-03-19 NOTE — Progress Notes (Signed)
Subjective:  Continues to have some back pain. BP's elevated after taking off Lasix yesterday.   Objective Vital signs in last 24 hours: Filed Vitals:   03/19/15 0356 03/19/15 0500 03/19/15 0614 03/19/15 1014  BP: 155/102 162/109 162/109   Pulse: 90 91 91   Temp:  98.4 F (36.9 C) 98.4 F (36.9 C)   TempSrc:  Oral Oral   Resp:  18 18   Height:      Weight:      SpO2:  91% 95% 96%   Weight change:   Intake/Output Summary (Last 24 hours) at 03/19/15 1043 Last data filed at 03/19/15 1025  Gross per 24 hour  Intake    120 ml  Output    400 ml  Net   -280 ml    Assessment/ Plan: Pt is a 44 y.o. yo male with known CKD and HTN- (possible renal artery issue diagnosed in past) who was admitted on 03/12/2015 with flank pain- imaging suspicious for renal artery dissection and malignant HTN  Assessment/Plan: 1. 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  There has now been some acute disruption of flow and likely dissection/thrombus of that artery giving [place] this picture.  I agree that intervention on this can be tricky BUT if we are not able to achieve BP control it may be our only option- kidney size indicates that this kidney is salvageable.  - On Heparin bridge to Coumadin. INR coming up. Will need INR checks as an outpatient.  - Per Vascular 3 months of anticoagulation then transition to antiplatelet.  - Follow up CTA in 3 months.   2. Renal - A on CRF (CKD II-III) in the setting of 2 dye procedures and malignant HTN.  U/S neg for obstruction and U/A not indicative of intrarenal process.  Hoping that as more time from dye and as BP gets better the renal function will settle out.  - Discontinued Lasix this am.  - Creatinine improving.  - Would f/u Cr in the am if he doesn't go today.  - Will need follow up labs and Nephrology follow up.    3. HTN- malignant.  I  have made medication change every day.  Now on hydralazine 100 TID, labetalol 400 BID, aldactone 50 BID.  Norvasc 5 daily -  Discontinued lasix yesterday. BP's came up. Increased Norvasc to 10mg  daily this am. Will see what his BP does. Will need close outpatient follow up and ongoing management until he is followed up with Vascular surgery.  4. Constipation - Avoid Magnesium or Phosphorous based cathartics. Could use Sorbitol.     Dylan Sweeney    Labs: Basic Metabolic Panel:  Recent Labs Lab 03/17/15 0432 03/18/15 0558 03/19/15 0511  NA 133* 133* 131*  K 3.5 3.5 4.0  CL 96* 97* 94*  CO2 28 25 24   GLUCOSE 127* 112* 96  BUN 15 22* 21*  CREATININE 2.63* 2.80* 2.70*  CALCIUM 9.4 9.6 9.7  PHOS 3.8 5.4*  --    Liver Function Tests:  Recent Labs Lab 03/15/15 0239 03/16/15 0353 03/17/15 0432 03/18/15 0558 03/19/15 0511  AST 17 21  --   --  24  ALT 14* 14*  --   --  31  ALKPHOS 41 41  --   --  55  BILITOT 0.8 1.0  --   --  1.0  PROT 6.8 7.1  --   --  8.0  ALBUMIN 3.5 3.3* 3.4* 3.6 3.5   No results for input(s): LIPASE, AMYLASE in the last 168 hours. No results for input(s): AMMONIA in the last 168 hours. CBC:  Recent Labs Lab 03/12/15 1335  03/15/15 0239 03/16/15 0353 03/17/15 0432 03/18/15 1438 03/19/15 0511  WBC 10.9*  < > 13.0* 13.2* 15.4* 14.8* 16.3*  NEUTROABS 7.3  --   --   --   --   --   --   HGB 15.5  < > 13.2 13.3 13.6 13.9 13.8  HCT 44.9  < > 39.4 40.0 40.4 40.0 40.3  MCV 84.9  < > 86.2 85.8 86.1 84.4 85.2  PLT 276  < > 271 279 292 294 350  < > = values in this interval not displayed. Cardiac Enzymes:  Recent Labs Lab 03/12/15 2115 03/13/15 0049 03/13/15 0710  TROPONINI <0.03 <0.03 <0.03   CBG: No results for input(s): GLUCAP in the last 168 hours.  Iron Studies: No results for input(s): IRON, TIBC, TRANSFERRIN, FERRITIN in the last 72 hours. Studies/Results: No results found. Medications: Infusions: . heparin 1,800 Units/hr (03/19/15  0406)    Scheduled Medications: . amLODipine  10 mg Oral Daily  . docusate sodium  100 mg Oral BID  . folic acid  1 mg Oral Daily  . hydrALAZINE  100 mg Oral 3 times per day  . labetalol  400 mg Oral BID  . lactulose  10 g Oral BID  . mometasone-formoterol  2 puff Inhalation BID  . multivitamin with minerals  1 tablet Oral Daily  . omega-3 acid ethyl esters  1 g Oral BID  . polyethylene glycol  17 g Oral Daily  . polyethylene glycol  17 g Oral Daily  . sodium chloride  3 mL Intravenous Q12H  . spironolactone  50 mg Oral BID  . tamsulosin  0.8 mg Oral QPC breakfast  . thiamine  100 mg Oral Daily  . warfarin  7.5 mg Oral q1800  . Warfarin - Pharmacist Dosing Inpatient   Does not apply q1800    have reviewed scheduled and prn medications.  Physical Exam: General: NAD, resting in bed Heart:RRR, No MGR, normal S1/S2 Lungs: CTA Bilaterally, appropriate rate, unlabored.  Abdomen: soft, non tender, No abdominal bruits.  Extremities: WWP, no edema.     03/19/2015,10:43 AM  LOS: 7 days

## 2015-03-19 NOTE — Discharge Summary (Addendum)
Physician Discharge Summary  Dylan Sweeney MRN: [id number] DOB/AGE: 12-01-1970 44 y.o.  PCP: No primary care provider on file.   Admit date: 03/12/2015 Discharge date: 03/19/2015  Discharge Diagnoses:   Active Problems:   Hypertensive urgency   Hypertensive urgency, malignant   Marijuana use    Follow-up recommendations Follow-up with PCP in 3-5 days , including all  additional recommended appointments as below Follow-up CBC, CMP in 3-5 days CTA scheduled for 3 months and f/u with Dr. Trula Slade Follow-up PT/INR every 2-3 days.     Medication List    STOP taking these medications        hydrochlorothiazide 25 MG tablet  Commonly known as:  HYDRODIURIL     losartan 50 MG tablet  Commonly known as:  COZAAR      TAKE these medications        albuterol 108 (90 BASE) MCG/ACT inhaler  Commonly known as:  PROVENTIL HFA;VENTOLIN HFA  Inhale into the lungs every 6 (six) hours as needed for wheezing or shortness of breath.     amLODipine 10 MG tablet  Commonly known as:  NORVASC  Take 1 tablet (10 mg total) by mouth daily.     cholecalciferol 1000 UNITS tablet  Commonly known as:  VITAMIN D  Take 1,000 Units by mouth daily.     docusate sodium 100 MG capsule  Commonly known as:  COLACE  Take 1 capsule (100 mg total) by mouth 2 (two) times daily.     Fluticasone-Salmeterol 250-50 MCG/DOSE Aepb  Commonly known as:  ADVAIR  Inhale 1 puff into the lungs 2 (two) times daily.     folic acid 1 MG tablet  Commonly known as:  FOLVITE  Take 1 tablet (1 mg total) by mouth daily.     hydrALAZINE 100 MG tablet  Commonly known as:  APRESOLINE  Take 1 tablet (100 mg total) by mouth every 8 (eight) hours.     labetalol 200 MG tablet  Commonly known as:  NORMODYNE  Take 2 tablets (400 mg total) by mouth 2 (two) times daily.     omega-3 acid ethyl esters 1 G capsule  Commonly known as:  LOVAZA  Take by mouth 2 (two) times daily.     oxyCODONE 5 MG immediate release tablet   Commonly known as:  Oxy IR/ROXICODONE  Take 1 tablet (5 mg total) by mouth every 6 (six) hours as needed for severe pain.     polyethylene glycol packet  Commonly known as:  MIRALAX / GLYCOLAX  Take 17 g by mouth daily.     spironolactone 50 MG tablet  Commonly known as:  ALDACTONE  Take 1 tablet (50 mg total) by mouth 2 (two) times daily.     tamsulosin 0.4 MG Caps capsule  Commonly known as:  FLOMAX  Take 2 capsules (0.8 mg total) by mouth daily after breakfast.     thiamine 100 MG tablet  Take 1 tablet (100 mg total) by mouth daily.     warfarin 5 MG tablet  Commonly known as:  COUMADIN  Take 1 tablet (5 mg total) by mouth one time only at 6 PM.         Discharge Condition: Stable   Discharge Instructions       Discharge Instructions    Diet - low sodium heart healthy    Complete by:  As directed      Increase activity slowly    Complete by:  As directed  Allergies  Allergen Reactions  . Other Other (See Comments)    G6 pill allergy: pill given before travel in [place]      Disposition: Final discharge disposition not confirmed   Consults:  Vascular surgery Nephrology   Significant Diagnostic Studies:  US Renal  03/15/2015  CLINICAL DATA:  Hypertension, suprapubic pain.  Pelvic pain. EXAM: RENAL / URINARY TRACT ULTRASOUND COMPLETE COMPARISON:  CT 03/14/2015 FINDINGS: Right Kidney: Length: 11.2 cm. Mildly increased echotexture throughout the right kidney. Small lower pole cyst measures 8 mm. No hydronephrosis. Left Kidney: Length: 11.7 cm. Mildly increased echotexture. 10 mm upper pole and 11 mm lower pole cyst. No hydronephrosis. Bladder: Bladder wall appears mildly thickened at 5 mm.  Prostate prominent. IMPRESSION: Mildly increased echotexture in the kidneys bilaterally suggesting chronic medical renal disease. No hydronephrosis. Bladder wall thickening, possibly related to some degree of bladder outlet obstruction. Prostate mildly  prominent. Electronically Signed   By: Rolm Baptise M.D.   On: 03/15/2015 16:38   Ct Angio Chest Aorta W/cm &/or Wo/cm  03/12/2015  CLINICAL DATA:  Patient with back pain. Evaluate for aortic dissection. EXAM: CT ANGIOGRAPHY CHEST, ABDOMEN AND PELVIS TECHNIQUE: Multidetector CT imaging of the chest, abdomen and pelvis was performed using the standard protocol during bolus administration of intravenous contrast. Multiplanar CT image reconstructions and MIPs were obtained to evaluate the vascular anatomy. CONTRAST:  11m OMNIPAQUE IOHEXOL 350 MG/ML SOLN COMPARISON:  None. FINDINGS: CTA CHEST FINDINGS Mediastinum/Nodes: Noncontrast imaging through the thorax demonstrates no peripheral high attenuation within the thoracic aorta to suggest intramural hematoma formation. Post contrast-enhanced images demonstrate motion artifact limiting evaluation of the aortic root. The thoracic aorta is opacified with contrast material. No evidence for acute thoracic aortic dissection. The takeoff of the great vessels are patent. Visualized thyroid is unremarkable. No enlarged axillary, mediastinal or hilar lymphadenopathy. Heart is normal in size. No pericardial effusion. Aorta and main pulmonary artery normal in caliber. No evidence for central pulmonary embolism. Coronary arterial vascular calcifications. Lungs/Pleura: The central airways are patent. No consolidative pulmonary opacities. No pleural effusion or pneumothorax. Review of the MIP images confirms the above findings. CTA ABDOMEN FINDINGS Hepatobiliary: Liver is normal in size and contour. No focal hepatic lesion is identified. The gallbladder is unremarkable. No intrahepatic or extrahepatic biliary ductal dilatation. Pancreas: Unremarkable Spleen: Unremarkable Adrenals/Urinary Tract: The adrenal glands are normal. The kidneys enhance symmetrically with contrast. Multiple bilateral low-attenuation subcentimeter renal lesions are demonstrated, too small to accurately  characterize. There is circumferential wall thickening of the urinary bladder. Stomach/Bowel: No abnormal bowel wall thickening or evidence for bowel obstruction. No free fluid or free intraperitoneal air. No evidence for bowel obstruction. Vascular/Lymphatic: The abdominal aorta is patent and normal in caliber. There is fat stranding about the left renal artery with apparent focal areas of narrowing, demonstrated best on sagittal images (image 103; series 503). The branch vessels including the celiac, superior mesenteric and inferior mesenteric arteries are patent. The common iliac vessels as well as the internal and external iliac vessels are patent. Other: Prostate enlarged. Musculoskeletal: No aggressive or acute appearing osseous lesions. Review of the MIP images confirms the above findings. IMPRESSION: There is irregularity and focal narrowing of the left renal artery with with surrounding fat stranding. There is some motion artifact limiting evaluation of this location. Overall findings are nonspecific however the top differential considerations would be focal left renal artery dissection or vasculitis. Additional but less likely consideration would be left renal artery infection in the setting of  pyelonephritis. On single phase contrast-enhanced exam, the left kidney appears perfused. Critical Value/emergent results were called by telephone at the time of interpretation on 03/12/2015 at 3:28 pm to Dr. Eulis Foster, who verbally acknowledged these results. Electronically Signed   By: Lovey Newcomer M.D.   On: 03/12/2015 15:30   Ct Angio Abd/pel W/ And/or W/o  03/14/2015  CLINICAL DATA:  RULE OUT DISSECTION, PREVIOUS SCAN SUBOPTIMAL DUE TO MOTION ARTIFACT AND POOR CONTRAST BOLUS ENHANCEMENT CONCERN FOR LEFT RENAL ARTERY PATHOLOGY LOWER LEFT SIDE BACK PAIN SINCE Friday LAST WEEK : CT ANGIOGRAPHY ABDOMEN AND PELVIS TECHNIQUE: Multidetector CT imaging of the abdomen and pelvis was performed using the standard protocol  during bolus administration of intravenous contrast. Multiplanar reconstructed images including MIPs were obtained and reviewed to evaluate the vascular anatomy. CONTRAST:  63m OMNIPAQUE IOHEXOL 350 MG/ML SOLN, reduced dose due to renal insufficiency COMPARISON:  COMPARISON 03/12/2015 FINDINGS: ARTERIAL FINDINGS: Aorta:                Normal Celiac axis:          Patent Superior mesenteric:  Patent, classic distal branch anatomy. Left renal: There are 3. In the superior left renal artery, there is Circumferential wall thickening and luminal narrowing through most of the artery, with a short segment of relative sparing Extending over length of less than 1 cm in the midportion. The circumferential wall thickening and luminal narrowing extends through the bifurcation to involve posterior and to a lesser degree anterior divisions. There are mild regional inflammatory changes. The more diminutive middle left renal artery is unremarkable. The inferior left renal artery is aberrant, arising from the aorta below the level of the IMA, widely patent and unremarkable. Right renal:          Single, unremarkable. Inferior mesenteric:  Patent Left iliac:           Unremarkable Right iliac:          Unremarkable Venous findings: Dedicated venous phase imaging not obtained. Circumaortic left renal vein is noted. Review of the MIP images confirms the above findings. Nonvascular findings: Platelike atelectasis or scarring in the lateral basal segment right lower lobe. Unremarkable arterial phase evaluation of liver, nondilated gallbladder, spleen, adrenal glands, pancreas. Probable cyst in the lower pole left kidney as before, incompletely characterized. Stomach, small bowel, and colon are nondilated. Normal appendix. A few scattered descending and sigmoid diverticula without adjacent inflammatory/ edematous change. Urinary bladder is somewhat thick walled, physiologically distended. Moderate prostatic enlargement. No ascites. No  free air. IMPRESSION: 1. Long segment wall thickening and luminal narrowing in the superior left renal artery extending into anterior posterior divisions. The findings are nonspecific and may represent atypical changes of fibromuscular dysplasia, large vessel arteritis (without any other evident areas of involvement), possibly with superimposed dissection. 2. Widely patent middle and aberrant inferior left renal arteries perfuse a significant portion of the left renal parenchyma. Electronically Signed   By: DLucrezia EuropeM.D.   On: 03/14/2015 13:29   Ct Angio Abd/pel W/ And/or W/o  03/12/2015  CLINICAL DATA:  Patient with back pain. Evaluate for aortic dissection. EXAM: CT ANGIOGRAPHY CHEST, ABDOMEN AND PELVIS TECHNIQUE: Multidetector CT imaging of the chest, abdomen and pelvis was performed using the standard protocol during bolus administration of intravenous contrast. Multiplanar CT image reconstructions and MIPs were obtained to evaluate the vascular anatomy. CONTRAST:  865mOMNIPAQUE IOHEXOL 350 MG/ML SOLN COMPARISON:  None. FINDINGS: CTA CHEST FINDINGS Mediastinum/Nodes: Noncontrast imaging through the thorax demonstrates no  peripheral high attenuation within the thoracic aorta to suggest intramural hematoma formation. Post contrast-enhanced images demonstrate motion artifact limiting evaluation of the aortic root. The thoracic aorta is opacified with contrast material. No evidence for acute thoracic aortic dissection. The takeoff of the great vessels are patent. Visualized thyroid is unremarkable. No enlarged axillary, mediastinal or hilar lymphadenopathy. Heart is normal in size. No pericardial effusion. Aorta and main pulmonary artery normal in caliber. No evidence for central pulmonary embolism. Coronary arterial vascular calcifications. Lungs/Pleura: The central airways are patent. No consolidative pulmonary opacities. No pleural effusion or pneumothorax. Review of the MIP images confirms the above  findings. CTA ABDOMEN FINDINGS Hepatobiliary: Liver is normal in size and contour. No focal hepatic lesion is identified. The gallbladder is unremarkable. No intrahepatic or extrahepatic biliary ductal dilatation. Pancreas: Unremarkable Spleen: Unremarkable Adrenals/Urinary Tract: The adrenal glands are normal. The kidneys enhance symmetrically with contrast. Multiple bilateral low-attenuation subcentimeter renal lesions are demonstrated, too small to accurately characterize. There is circumferential wall thickening of the urinary bladder. Stomach/Bowel: No abnormal bowel wall thickening or evidence for bowel obstruction. No free fluid or free intraperitoneal air. No evidence for bowel obstruction. Vascular/Lymphatic: The abdominal aorta is patent and normal in caliber. There is fat stranding about the left renal artery with apparent focal areas of narrowing, demonstrated best on sagittal images (image 103; series 503). The branch vessels including the celiac, superior mesenteric and inferior mesenteric arteries are patent. The common iliac vessels as well as the internal and external iliac vessels are patent. Other: Prostate enlarged. Musculoskeletal: No aggressive or acute appearing osseous lesions. Review of the MIP images confirms the above findings. IMPRESSION: There is irregularity and focal narrowing of the left renal artery with with surrounding fat stranding. There is some motion artifact limiting evaluation of this location. Overall findings are nonspecific however the top differential considerations would be focal left renal artery dissection or vasculitis. Additional but less likely consideration would be left renal artery infection in the setting of pyelonephritis. On single phase contrast-enhanced exam, the left kidney appears perfused. Critical Value/emergent results were called by telephone at the time of interpretation on 03/12/2015 at 3:28 pm to Dr. Eulis Foster, who verbally acknowledged these results.  Electronically Signed   By: Lovey Newcomer M.D.   On: 03/12/2015 15:30    2-D echo LV EF: 55% -  60%  ------------------------------------------------------------------- Indications:   Chest pain 786.51.  ------------------------------------------------------------------- History:  Risk factors: Hypertension.  ------------------------------------------------------------------- Study Conclusions  - Left ventricle: The cavity size was normal. Wall thickness was increased in a pattern of moderate LVH. Systolic function was normal. The estimated ejection fraction was in the range of 55% to 60%. Wall motion was normal; there were no regional wall motion abnormalities. Doppler parameters are consistent with abnormal left ventricular relaxation (grade 1 diastolic dysfunction).   Filed Weights   03/12/15 1141 03/12/15 2100  Weight: 86.183 kg (190 lb) 85.2 kg (187 lb 13.3 oz)     Microbiology: Recent Results (from the past 240 hour(s))  MRSA PCR Screening     Status: None   Collection Time: 03/12/15 10:47 PM  Result Value Ref Range Status   MRSA by PCR NEGATIVE NEGATIVE Final    Comment:        The GeneXpert MRSA Assay (FDA approved for NASAL specimens only), is one component of a comprehensive MRSA colonization surveillance program. It is not intended to diagnose MRSA infection nor to guide or monitor treatment for MRSA infections.  Blood Culture No results found for: SDES, [place], CULT, REPTSTATUS    Labs: Results for orders placed or performed during the hospital encounter of 03/12/15 (from the past 48 hour(s))  Heparin level (unfractionated)     Status: None   Collection Time: 03/18/15  5:58 AM  Result Value Ref Range   Heparin Unfractionated 0.46 0.30 - 0.70 IU/mL    Comment:        IF HEPARIN RESULTS ARE BELOW EXPECTED VALUES, AND PATIENT DOSAGE HAS BEEN CONFIRMED, SUGGEST FOLLOW UP TESTING OF ANTITHROMBIN III LEVELS.   Renal  function panel     Status: Abnormal   Collection Time: 03/18/15  5:58 AM  Result Value Ref Range   Sodium 133 (L) 135 - 145 mmol/L   Potassium 3.5 3.5 - 5.1 mmol/L   Chloride 97 (L) 101 - 111 mmol/L   CO2 25 22 - 32 mmol/L   Glucose, Bld 112 (H) 65 - 99 mg/dL   BUN 22 (H) 6 - 20 mg/dL   Creatinine, Ser 2.80 (H) 0.61 - 1.24 mg/dL   Calcium 9.6 8.9 - 10.3 mg/dL   Phosphorus 5.4 (H) 2.5 - 4.6 mg/dL   Albumin 3.6 3.5 - 5.0 g/dL   GFR calc non Af Amer 25 (L) >60 mL/min   GFR calc Af Amer 30 (L) >60 mL/min    Comment: (NOTE) The eGFR has been calculated using the CKD EPI equation. This calculation has not been validated in all clinical situations. eGFR's persistently <60 mL/min signify possible Chronic Kidney Disease.    Anion gap 11 5 - 15  Protime-INR     Status: Abnormal   Collection Time: 03/18/15  5:58 AM  Result Value Ref Range   Prothrombin Time 19.3 (H) 11.6 - 15.2 seconds   INR 1.63 (H) 0.00 - 1.49  CBC     Status: Abnormal   Collection Time: 03/18/15  2:38 PM  Result Value Ref Range   WBC 14.8 (H) 4.0 - 10.5 K/uL   RBC 4.74 4.22 - 5.81 MIL/uL   Hemoglobin 13.9 13.0 - 17.0 g/dL   HCT 40.0 39.0 - 52.0 %   MCV 84.4 78.0 - 100.0 fL   MCH 29.3 26.0 - 34.0 pg   MCHC 34.8 30.0 - 36.0 g/dL   RDW 12.2 11.5 - 15.5 %   Platelets 294 150 - 400 K/uL  Heparin level (unfractionated)     Status: None   Collection Time: 03/19/15  5:11 AM  Result Value Ref Range   Heparin Unfractionated 0.53 0.30 - 0.70 IU/mL    Comment:        IF HEPARIN RESULTS ARE BELOW EXPECTED VALUES, AND PATIENT DOSAGE HAS BEEN CONFIRMED, SUGGEST FOLLOW UP TESTING OF ANTITHROMBIN III LEVELS.   CBC     Status: Abnormal   Collection Time: 03/19/15  5:11 AM  Result Value Ref Range   WBC 16.3 (H) 4.0 - 10.5 K/uL   RBC 4.73 4.22 - 5.81 MIL/uL   Hemoglobin 13.8 13.0 - 17.0 g/dL   HCT 40.3 39.0 - 52.0 %   MCV 85.2 78.0 - 100.0 fL   MCH 29.2 26.0 - 34.0 pg   MCHC 34.2 30.0 - 36.0 g/dL   RDW 12.4 11.5 -  15.5 %   Platelets 350 150 - 400 K/uL  Comprehensive metabolic panel     Status: Abnormal   Collection Time: 03/19/15  5:11 AM  Result Value Ref Range   Sodium 131 (L) 135 - 145 mmol/L   Potassium  4.0 3.5 - 5.1 mmol/L   Chloride 94 (L) 101 - 111 mmol/L   CO2 24 22 - 32 mmol/L   Glucose, Bld 96 65 - 99 mg/dL   BUN 21 (H) 6 - 20 mg/dL   Creatinine, Ser 2.70 (H) 0.61 - 1.24 mg/dL   Calcium 9.7 8.9 - 10.3 mg/dL   Total Protein 8.0 6.5 - 8.1 g/dL   Albumin 3.5 3.5 - 5.0 g/dL   AST 24 15 - 41 U/L   ALT 31 17 - 63 U/L   Alkaline Phosphatase 55 38 - 126 U/L   Total Bilirubin 1.0 0.3 - 1.2 mg/dL   GFR calc non Af Amer 27 (L) >60 mL/min   GFR calc Af Amer 31 (L) >60 mL/min    Comment: (NOTE) The eGFR has been calculated using the CKD EPI equation. This calculation has not been validated in all clinical situations. eGFR's persistently <60 mL/min signify possible Chronic Kidney Disease.    Anion gap 13 5 - 15  Protime-INR     Status: Abnormal   Collection Time: 03/19/15  5:11 AM  Result Value Ref Range   Prothrombin Time 26.5 (H) 11.6 - 15.2 seconds   INR 2.48 (H) 0.00 - 1.49     Lipid Panel     Component Value Date/Time   CHOL 163 03/13/2015 0049   TRIG 325* 03/13/2015 0049   HDL 32* 03/13/2015 0049   CHOLHDL 5.1 03/13/2015 0049   VLDL 65* 03/13/2015 0049   LDLCALC 66 03/13/2015 0049     Lab Results  Component Value Date   HGBA1C 5.0 03/12/2015     Lab Results  Component Value Date   LDLCALC 66 03/13/2015   CREATININE 2.70* 03/19/2015     HPI :44 y/o M with hx of HTN (on 4 BP meds at home), Asthma, CKD II (followed by nephrology in [facility] up to this point), BPH?. Presents with left sided back pain and malignant hypertension. 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 He says he had some back pain on Monday that improved with narcotic medication given to him by a family member. He says that he had a headache,  with ongoing back pain, so he came in to the ED. No nausea, vomiting, fever, chills. No dysuria, hematuria, or urinary frequency. He has some mild abdominal pain. He has no chest pain. He has not had any numbness, or weakness. No acute changes in vision on admission, but did have some with his headache when the nitroglycerin drip was started. Does not smoke, does drink 2-3 beers / day. Strong family Hx of HTN with all of his siblings having hypertension requiring multiple medications. No hx of polycystic kidney disease, autoimmune disease, or renal artery stenosis in the past. No hx of any abdominal surgery. Blood pressure has been reasonably controlled on nitro drip and hydralazine not too low. He tells me his baseline creatinine has been 1.7 since his "30's"  HOSPITAL COURSE:   Hypertensive urgency, malignant /   left renal artery dissection Nephrology consulted for blood pressure control in the setting of left renal artery dissection  CT imaging concerning for focal narrowing of the left renal artery, concern for focal left renal artery dissection vs vasculitis.  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 Unable to clearly identify a dissection according to vascular surgery, however this is still possible. needs full anticoagulation for 3 months and then transition to anti-platelet therapy, treated with  heparin drip and Coumadin, INR therapeutic prior to discharge, 2.48 Patient may have underlying fibromuscular dysplasia or focal renal artery  stenosis Dr. Trula Slade consulted, recommends Follow up CTA in 3 months, after completion of 3 months of anticoagulation. Would not recommend acute intervention until creatinine resolves. 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 U/S neg for obstruction and U/A not indicative of intrarenal process. Hoping that as more time from dye and as BP gets better the renal function will settle out.  - Discontinued Lasix this am.  - Creatinine improving. 2.7 prior to discharge Will have patient follow up with Dr Moshe Cipro in 2 -3 weeks  Marijuana use-counseling done  Asthma-continue Advair, albuterol, stable  Alcohol abuse stable on CIWA protocol, did not witness any signs of alcohol withdrawal during this admission  BPH? Increased Flomax.    Discharge Exam:    Blood pressure 162/109, pulse 91, temperature 98.4 F (36.9 C), temperature source Oral, resp. rate 18, height 5' 11"  (1.803 m), weight 85.2 kg (187 lb 13.3 oz), SpO2 96 %.   General: NAD, AAOx3, resting in bed.  Cardiovascular: RRR, no MGR, normal S1/S2, 2+ distal pulses.  Respiratory: CTA Bilaterally, appropriate rate, unlabored.  Abdomen: S, NT, ND, No abdominal bruits audible. +BS. No back pain or abdominal pain.  Extremities: WWP, 2+ distal pulses, no edema, MAEW.    Follow-up Information    Follow up with GOLDSBOROUGH,KELLIE A, MD. Schedule an appointment as soon as possible for a visit on 03/22/2015.   Specialty:  Nephrology   Contact information:   [place] [id number] [phone]       Signed: Reyne Dumas 03/19/2015, 12:36 PM        Time spent >45 mins

## 2015-03-19 NOTE — Progress Notes (Signed)
ANTICOAGULATION CONSULT NOTE - Follow up Consult  Pharmacy Consult for Heparin/Warfarin Indication: Renal artery dissection  Allergies  Allergen Reactions  . Other Other (See Comments)    G6 pill allergy: pill given before travel in [place]    Patient Measurements: Height: 5\' 11"  (180.3 cm) Weight: 187 lb 13.3 oz (85.2 kg) IBW/kg (Calculated) : 75.3  Heparin Wt: 85.2 kg   Vital Signs: Temp: 98.4 F (36.9 C) (11/15 0614) Temp Source: Oral (11/15 0614) BP: 162/109 mmHg (11/15 0614) Pulse Rate: 91 (11/15 0614)  Labs:  Recent Labs  03/17/15 0432 03/18/15 0558 03/18/15 1438 03/19/15 0511  HGB 13.6  --  13.9 13.8  HCT 40.4  --  40.0 40.3  PLT 292  --  294 350  LABPROT 17.6* 19.3*  --  26.5*  INR 1.44 1.63*  --  2.48*  HEPARINUNFRC 0.38 0.46  --  0.53  CREATININE 2.63* 2.80*  --  2.70*    Estimated Creatinine Clearance: 36.4 mL/min (by C-G formula based on Cr of 2.7).   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 Heparin level = 0.53, remains therapeutic on 1800 units/hr. INR 1.63 > 2.48, trending up nicely on 7.5 mg coumadin daily. Hgb 13.8, plt 350K, No bleeding noted per chart.   [facility] recs: coumadin x 3 months, then switch to aspirin or plavix.  Goal of Therapy:  Heparin level 0.3-0.7 units/ml Monitor platelets by anticoagulation protocol: Yes  INR 2-3   Plan:  - Continue heparin drip at 1800 units/hr (18 ml/hr), hopefully d/c heparin tomorrow AM if INR remains therapeutic - Coumadin 5 mg po x 1, might require alternating 5mg /7.5mg  daily at discharge  - Daily HL, CBC, INR   Maryanna Shape, PharmD, BCPS  Clinical Pharmacist  Pager: [phone]  03/19/2015 10:47 AM

## 2015-03-19 NOTE — Progress Notes (Signed)
Pt and wife given discharge instructions and prescriptions.  All questions and answered with pt's understanding.  Belongings with pt at discharge. Nad.

## 2015-03-27 ENCOUNTER — Other Ambulatory Visit: Payer: Self-pay | Admitting: *Deleted

## 2015-03-27 ENCOUNTER — Telehealth: Payer: Self-pay | Admitting: Surgery

## 2015-03-27 DIAGNOSIS — I7773 Dissection of renal artery: Secondary | ICD-10-CM

## 2015-03-27 NOTE — Telephone Encounter (Signed)
-----   Message from Mena Goes, RN sent at 03/27/2015 10:33 AM EST ----- Regarding: RE: schedule Per [facility], just get a renal ultrasound here in the office in 3 months.  ----- Message -----    From: Gena Fray    Sent: 03/27/2015   9:56 AM      To: [facility] Clinical Pool Subject: FW: schedule                                   This patient has worsening kidney disease. CTA has been ordered, but creatinine is elevated. Should this be a CT instead?  Thanks, Hinton Dyer ----- Message -----    From: Mena Goes, RN    Sent: 03/15/2015   3:21 PM      To: [facility] Pool Subject: schedule                                         ----- Message -----    From: Serafina Mitchell, MD    Sent: 03/15/2015   1:01 PM      To: [facility] Charge Pool  03/15/2015: Follow-up hospital visit.   Schedule patient for follow-up in 3 months with a CT angiogram of the abdomen and pelvis to follow up renal artery dissection

## 2015-03-27 NOTE — Telephone Encounter (Signed)
Spoke with pt re appt, dpm  °

## 2015-06-11 ENCOUNTER — Encounter: Payer: Self-pay | Admitting: Surgery

## 2015-06-17 ENCOUNTER — Ambulatory Visit: Payer: Non-veteran care | Admitting: Surgery

## 2015-06-17 ENCOUNTER — Encounter (HOSPITAL_COMMUNITY): Payer: Non-veteran care

## 2015-08-03 ENCOUNTER — Encounter (HOSPITAL_COMMUNITY): Payer: Self-pay | Admitting: *Deleted

## 2015-08-03 ENCOUNTER — Emergency Department (HOSPITAL_COMMUNITY)
Admission: EM | Admit: 2015-08-03 | Discharge: 2015-08-03 | Disposition: A | Discharge status: Home / Self Care | Payer: Non-veteran care | Attending: Physician Assistant | Admitting: Physician Assistant

## 2015-08-03 DIAGNOSIS — Z862 Personal history of diseases of the blood and blood-forming organs and certain disorders involving the immune mechanism: Secondary | ICD-10-CM | POA: Insufficient documentation

## 2015-08-03 DIAGNOSIS — Z79899 Other long term (current) drug therapy: Secondary | ICD-10-CM | POA: Diagnosis not present

## 2015-08-03 DIAGNOSIS — R300 Dysuria: Secondary | ICD-10-CM | POA: Insufficient documentation

## 2015-08-03 DIAGNOSIS — R39198 Other difficulties with micturition: Secondary | ICD-10-CM | POA: Insufficient documentation

## 2015-08-03 DIAGNOSIS — Z7951 Long term (current) use of inhaled steroids: Secondary | ICD-10-CM | POA: Insufficient documentation

## 2015-08-03 DIAGNOSIS — Z7901 Long term (current) use of anticoagulants: Secondary | ICD-10-CM | POA: Diagnosis not present

## 2015-08-03 DIAGNOSIS — J45909 Unspecified asthma, uncomplicated: Secondary | ICD-10-CM | POA: Diagnosis not present

## 2015-08-03 DIAGNOSIS — I10 Essential (primary) hypertension: Secondary | ICD-10-CM | POA: Insufficient documentation

## 2015-08-03 DIAGNOSIS — N400 Enlarged prostate without lower urinary tract symptoms: Secondary | ICD-10-CM | POA: Insufficient documentation

## 2015-08-03 LAB — URINALYSIS, ROUTINE W REFLEX MICROSCOPIC
BILIRUBIN URINE: NEGATIVE
Glucose, UA: NEGATIVE mg/dL
HGB URINE DIPSTICK: NEGATIVE
Ketones, ur: NEGATIVE mg/dL
Leukocytes, UA: NEGATIVE
Nitrite: NEGATIVE
PH: 5.5 (ref 5.0–8.0)
Protein, ur: NEGATIVE mg/dL
SPECIFIC GRAVITY, URINE: 1.006 (ref 1.005–1.030)

## 2015-08-03 MED ORDER — TAMSULOSIN HCL 0.4 MG PO CAPS
0.8000 mg | ORAL_CAPSULE | Freq: Once | ORAL | Status: AC
  Administered 2015-08-03: 0.8 mg via ORAL
  Filled 2015-08-03: qty 2

## 2015-08-03 MED ORDER — AZITHROMYCIN 250 MG PO TABS
1000.0000 mg | ORAL_TABLET | Freq: Once | ORAL | Status: AC
  Administered 2015-08-03: 1000 mg via ORAL
  Filled 2015-08-03: qty 4

## 2015-08-03 MED ORDER — CEFTRIAXONE SODIUM 250 MG IJ SOLR
250.0000 mg | Freq: Once | INTRAMUSCULAR | Status: AC
  Administered 2015-08-03: 250 mg via INTRAMUSCULAR
  Filled 2015-08-03: qty 250

## 2015-08-03 MED ORDER — STERILE WATER FOR INJECTION IJ SOLN
INTRAMUSCULAR | Status: AC
  Administered 2015-08-03: 10 mL
  Filled 2015-08-03: qty 10

## 2015-08-03 NOTE — ED Provider Notes (Signed)
CSN: [id number]     Arrival date & time 08/03/15  2038 History  By signing my name below, I, Rowan Blase, attest that this documentation has been prepared under the direction and in the presence of non-physician practitioner, Alecia Lemming PA-C. Electronically Signed: Rowan Blase, Scribe. 08/03/2015. 9:51 PM.    Chief Complaint  Patient presents with  . Dysuria   The history is provided by the patient. No language interpreter was used.    HPI Comments:  Dylan Sweeney is a 45 y.o. male with PMHx of HTN and renal disorder who presents to the Emergency Department complaining of persistent pressure with urination since yesterday. Pt reports associated discomfort with urination and feeling like he's not able to empty his bladder all the way. No alleviating factors noted. He notes h/o enlarged prostate and states he ran out of Tamsulosin prescription 6 days ago. Pt states he could have an STD. Reported recent unprotected sexual intercourse 4 days ago. Denies stinging or burning with urination, pus or drainage from penis, hematuria, painful BM, or fever.  Past Medical History  Diagnosis Date  . Hypertension   . Renal disorder   . Asthma   . Renal insufficiency   . Sickle cell anemia (HCC)     Has the trait-per patient (03/12/15)   History reviewed. No pertinent past surgical history. History reviewed. No pertinent family history. Social History  Substance Use Topics  . Smoking status: Never Smoker   . Smokeless tobacco: None  . Alcohol Use: Yes     Comment: 6 pack/week    Review of Systems  Constitutional: Negative for fever.  HENT: Negative for sore throat.   Eyes: Negative for discharge.  Gastrointestinal: Negative for rectal pain.  Genitourinary: Positive for dysuria and difficulty urinating. Negative for frequency, hematuria, discharge, genital sores, penile pain and testicular pain.  Musculoskeletal: Negative for arthralgias.  Skin: Negative for rash.  Hematological:  Negative for adenopathy.   Allergies  Other  Home Medications   Prior to Admission medications   Medication Sig Start Date End Date Taking? Authorizing Provider  albuterol (PROVENTIL HFA;VENTOLIN HFA) 108 (90 BASE) MCG/ACT inhaler Inhale into the lungs every 6 (six) hours as needed for wheezing or shortness of breath.    Historical Provider, MD  amLODipine (NORVASC) 10 MG tablet Take 1 tablet (10 mg total) by mouth daily. 03/19/15   Reyne Dumas, MD  cholecalciferol (VITAMIN D) 1000 UNITS tablet Take 1,000 Units by mouth daily.    Historical Provider, MD  docusate sodium (COLACE) 100 MG capsule Take 1 capsule (100 mg total) by mouth 2 (two) times daily. 03/19/15   Reyne Dumas, MD  Fluticasone-Salmeterol (ADVAIR) 250-50 MCG/DOSE AEPB Inhale 1 puff into the lungs 2 (two) times daily.    Historical Provider, MD  folic acid (FOLVITE) 1 MG tablet Take 1 tablet (1 mg total) by mouth daily. 03/19/15   Reyne Dumas, MD  hydrALAZINE (APRESOLINE) 100 MG tablet Take 1 tablet (100 mg total) by mouth every 8 (eight) hours. 03/19/15   Reyne Dumas, MD  labetalol (NORMODYNE) 200 MG tablet Take 2 tablets (400 mg total) by mouth 2 (two) times daily. 03/19/15   Reyne Dumas, MD  omega-3 acid ethyl esters (LOVAZA) 1 G capsule Take by mouth 2 (two) times daily.    Historical Provider, MD  oxyCODONE (OXY IR/ROXICODONE) 5 MG immediate release tablet Take 1 tablet (5 mg total) by mouth every 6 (six) hours as needed for severe pain. 03/19/15   Reyne Dumas, MD  polyethylene glycol (MIRALAX / GLYCOLAX) packet Take 17 g by mouth daily. 03/19/15   Reyne Dumas, MD  spironolactone (ALDACTONE) 50 MG tablet Take 1 tablet (50 mg total) by mouth 2 (two) times daily. 03/19/15   Reyne Dumas, MD  tamsulosin (FLOMAX) 0.4 MG CAPS capsule Take 2 capsules (0.8 mg total) by mouth daily after breakfast. 03/19/15   Reyne Dumas, MD  thiamine 100 MG tablet Take 1 tablet (100 mg total) by mouth daily. 03/19/15   Reyne Dumas, MD   warfarin (COUMADIN) 5 MG tablet Take 1 tablet (5 mg total) by mouth one time only at 6 PM. 03/19/15   Reyne Dumas, MD   BP 155/100 mmHg  Pulse 79  Temp(Src) 98.8 F (37.1 C) (Oral)  Resp 18  Ht 5\' 11"  (1.803 m)  Wt 193 lb (87.544 kg)  BMI 26.93 kg/m2  SpO2 99% Physical Exam  Constitutional: He appears well-developed and well-nourished.  HENT:  Head: Normocephalic and atraumatic.  Eyes: Conjunctivae are normal.  Neck: Normal range of motion. Neck supple.  Pulmonary/Chest: No respiratory distress.  Genitourinary: Testes normal and penis normal. Right testis shows no mass, no swelling and no tenderness. Left testis shows no mass, no swelling and no tenderness. Circumcised. No penile erythema or penile tenderness.  Neurological: He is alert.  Skin: Skin is warm and dry.  Psychiatric: He has a normal mood and affect.  Nursing note and vitals reviewed.   ED Course  Procedures  DIAGNOSTIC STUDIES:  Oxygen Saturation is 99% on RA, normal by my interpretation.    COORDINATION OF CARE:  9:43 PM Informed pt UA was clear. Will test for STDs. Encouraged pt to refill prostate medication and take as prescribed. Discussed treatment plan with pt at bedside and pt agreed to plan. Patient agrees to HIV and syphilis testing.  Labs Review Labs Reviewed  URINALYSIS, ROUTINE W REFLEX MICROSCOPIC (NOT AT [facility])  RPR  HIV ANTIBODY (ROUTINE TESTING)  GC/CHLAMYDIA PROBE AMP ([place]) NOT AT [facility]   Vital signs reviewed and are as follows: Filed Vitals:   08/03/15 2055  BP: 155/100  Pulse: 79  Temp: 98.8 F (37.1 C)  Resp: 18    10:30 PM Patient counseled on safe sexual practices. Told them that they should not have sexual contact for next 7 days and that they need to inform sexual partners so that they can get tested and treated as well. Patient verbalizes understanding and agrees with plan.     MDM   Final diagnoses:  Dysuria   Patient with dysuria. Does have some symptoms of  BPH as well. Given recent unprotected sexual contact, patient tested and treated for GC/chlamydia, HIV, RPR. Patient states that he will get his tamsulosin refilled.  I personally performed the services described in this documentation, which was scribed in my presence. The recorded information has been reviewed and is accurate.     Carlisle Cater, PA-C 08/03/15 [place], MD 08/03/15 2357

## 2015-08-03 NOTE — ED Notes (Signed)
To ED for eval of difficulty with urination since yesterday. Denies fever or back pain. States he feels like he cant fully empty his bladder.

## 2015-08-03 NOTE — ED Notes (Signed)
Pt is in stable condition upon d/c and ambulates from ED. 

## 2015-08-03 NOTE — ED Notes (Signed)
Called to put pt in room x3. No answer.

## 2015-08-03 NOTE — Discharge Instructions (Signed)
Please read and follow all provided instructions.  Your diagnoses today include:  1. Dysuria     Tests performed today include:  Test for gonorrhea and chlamydia.  Test for HIV and syphilis.   You will be notified by telephone with any positive results.   Vital signs. See below for your results today.   Medications:  You were treated with azithromycin and rocephin today. These antibiotics treat you for gonorrhea and chlamydia. They do not treat for HIV or syphilis.   Home care instructions:  Read educational materials contained in this packet and follow any instructions provided.   You should tell your partners about your infection and avoid having sex for one week to allow time for the medicine to work.  Sexually transmitted disease testing also available at:   [place]  [address] [place], phone [phone] or [phone]    Monday - Friday, call for an appointment  [place]  [address], [place], phone [phone] or [phone]   Monday - Friday, call for an appointment  Return instructions:   Please return to the Emergency Department if you experience worsening symptoms.   Please return if you have any other emergent concerns.  Additional Information:  Your vital signs today were: BP 155/100 mmHg   Pulse 79   Temp(Src) 98.8 F (37.1 C) (Oral)   Resp 18   Ht 5\' 11"  (1.803 m)   Wt 87.544 kg   BMI 26.93 kg/m2   SpO2 99% If your blood pressure (BP) was elevated above 135/85 this visit, please have this repeated by your doctor within one month. --------------

## 2015-08-04 LAB — HIV ANTIBODY (ROUTINE TESTING W REFLEX): HIV SCREEN 4TH GENERATION: NONREACTIVE

## 2015-08-04 LAB — RPR: RPR Ser Ql: NONREACTIVE

## 2015-08-14 LAB — PULMONARY FUNCTION TEST

## 2015-08-30 ENCOUNTER — Other Ambulatory Visit: Payer: Self-pay | Admitting: Infectious Diseases

## 2015-08-30 DIAGNOSIS — N289 Disorder of kidney and ureter, unspecified: Secondary | ICD-10-CM

## 2015-09-09 ENCOUNTER — Ambulatory Visit
Admission: RE | Admit: 2015-09-09 | Discharge: 2015-09-09 | Disposition: A | Discharge status: Home / Self Care | Payer: Non-veteran care | Source: Ambulatory Visit | Attending: Infectious Diseases | Admitting: Infectious Diseases

## 2015-09-09 DIAGNOSIS — N289 Disorder of kidney and ureter, unspecified: Secondary | ICD-10-CM

## 2016-09-11 ENCOUNTER — Encounter (HOSPITAL_COMMUNITY): Payer: Self-pay | Admitting: Emergency Medicine

## 2016-09-11 ENCOUNTER — Emergency Department (HOSPITAL_COMMUNITY)
Admission: EM | Admit: 2016-09-11 | Discharge: 2016-09-11 | Disposition: A | Discharge status: Home / Self Care | Payer: Non-veteran care | Attending: Emergency Medicine | Admitting: Emergency Medicine

## 2016-09-11 ENCOUNTER — Emergency Department (HOSPITAL_COMMUNITY): Payer: Non-veteran care

## 2016-09-11 DIAGNOSIS — J45909 Unspecified asthma, uncomplicated: Secondary | ICD-10-CM | POA: Insufficient documentation

## 2016-09-11 DIAGNOSIS — I129 Hypertensive chronic kidney disease with stage 1 through stage 4 chronic kidney disease, or unspecified chronic kidney disease: Secondary | ICD-10-CM | POA: Diagnosis not present

## 2016-09-11 DIAGNOSIS — Z79899 Other long term (current) drug therapy: Secondary | ICD-10-CM | POA: Diagnosis not present

## 2016-09-11 DIAGNOSIS — N183 Chronic kidney disease, stage 3 (moderate): Secondary | ICD-10-CM | POA: Diagnosis not present

## 2016-09-11 DIAGNOSIS — Z7901 Long term (current) use of anticoagulants: Secondary | ICD-10-CM | POA: Diagnosis not present

## 2016-09-11 DIAGNOSIS — R0600 Dyspnea, unspecified: Secondary | ICD-10-CM | POA: Diagnosis not present

## 2016-09-11 DIAGNOSIS — R0789 Other chest pain: Secondary | ICD-10-CM | POA: Insufficient documentation

## 2016-09-11 HISTORY — DX: Cardiomegaly: I51.7

## 2016-09-11 LAB — CBC
HEMATOCRIT: 43 % (ref 39.0–52.0)
HEMOGLOBIN: 14.4 g/dL (ref 13.0–17.0)
MCH: 28.8 pg (ref 26.0–34.0)
MCHC: 33.5 g/dL (ref 30.0–36.0)
MCV: 86 fL (ref 78.0–100.0)
Platelets: 286 10*3/uL (ref 150–400)
RBC: 5 MIL/uL (ref 4.22–5.81)
RDW: 13 % (ref 11.5–15.5)
WBC: 9.2 10*3/uL (ref 4.0–10.5)

## 2016-09-11 LAB — I-STAT TROPONIN, ED
TROPONIN I, POC: 0 ng/mL (ref 0.00–0.08)
TROPONIN I, POC: 0.01 ng/mL (ref 0.00–0.08)

## 2016-09-11 LAB — BASIC METABOLIC PANEL
ANION GAP: 10 (ref 5–15)
BUN: 19 mg/dL (ref 6–20)
CALCIUM: 9.6 mg/dL (ref 8.9–10.3)
CO2: 23 mmol/L (ref 22–32)
Chloride: 100 mmol/L — ABNORMAL LOW (ref 101–111)
Creatinine, Ser: 2.12 mg/dL — ABNORMAL HIGH (ref 0.61–1.24)
GFR, EST AFRICAN AMERICAN: 41 mL/min — AB (ref >60)
GFR, EST NON AFRICAN AMERICAN: 35 mL/min — AB (ref >60)
Glucose, Bld: 180 mg/dL — ABNORMAL HIGH (ref 65–99)
POTASSIUM: 3.8 mmol/L (ref 3.5–5.1)
SODIUM: 133 mmol/L — AB (ref 135–145)

## 2016-09-11 LAB — D-DIMER, QUANTITATIVE: D-Dimer, Quant: 1.26 ug{FEU}/mL — ABNORMAL HIGH (ref 0.00–0.50)

## 2016-09-11 MED ORDER — TECHNETIUM TC 99M DIETHYLENETRIAME-PENTAACETIC ACID
32.3000 | Freq: Once | INTRAVENOUS | Status: AC | PRN
  Administered 2016-09-11: 32.3 via RESPIRATORY_TRACT

## 2016-09-11 MED ORDER — NITROGLYCERIN 0.4 MG SL SUBL: 0.4000 mg | SUBLINGUAL_TABLET | SUBLINGUAL | Status: DC | PRN

## 2016-09-11 MED ORDER — ASPIRIN 81 MG PO CHEW
324.0000 mg | CHEWABLE_TABLET | Freq: Once | ORAL | Status: AC
  Administered 2016-09-11: 324 mg via ORAL
  Filled 2016-09-11: qty 4

## 2016-09-11 MED ORDER — GI COCKTAIL ~~LOC~~
30.0000 mL | Freq: Once | ORAL | Status: AC
  Administered 2016-09-11: 30 mL via ORAL
  Filled 2016-09-11: qty 30

## 2016-09-11 MED ORDER — TECHNETIUM TO 99M ALBUMIN AGGREGATED
4.2200 | Freq: Once | INTRAVENOUS | Status: AC | PRN
  Administered 2016-09-11: 4.22 via INTRAVENOUS

## 2016-09-11 NOTE — ED Triage Notes (Signed)
Pt reports right sided chest pain and sob x 1 week, reports hx of the same. Pt a/ox4, resp e/u, nad.

## 2016-09-11 NOTE — Discharge Instructions (Signed)
You were evaluated for your chest pain and shortness of breath.

## 2016-09-11 NOTE — ED Notes (Signed)
Pt taken to radiology

## 2016-09-11 NOTE — ED Provider Notes (Signed)
6:19 PM Handoff from Dr. Inda Castle at shift change.   Patient with atypical chest pain and shortness of breath pending VQ scan given chronic kidney disease.  This was negative. Patient informed. He is excited about going home.  BP (!) 138/96   Pulse 73   Temp 98.4 F (36.9 C) (Oral)   Resp 15   SpO2 97%   Patient was counseled to return with severe chest pain, especially if the pain is crushing or pressure-like and spreads to the arms, back, neck, or jaw, or if they have sweating, nausea, or shortness of breath with the pain. They were encouraged to call 911 with these symptoms.   They were also told to return if their chest pain gets worse and does not go away with rest, they have an attack of chest pain lasting longer than usual despite rest and treatment with the medications their caregiver has prescribed, if they wake from sleep with chest pain or shortness of breath, if they feel dizzy or faint, if they have chest pain not typical of their usual pain, or if they have any other emergent concerns regarding their health.  The patient verbalized understanding and agreed.     Carlisle Cater, PA-C 09/11/16 1820    Malvin Johns, MD 09/11/16 [phone]

## 2016-09-11 NOTE — ED Provider Notes (Signed)
[place] DEPT Provider Note   CSN: [id number] Arrival date & time: 09/11/16  1032     History   Chief Complaint Chief Complaint  Patient presents with  . Chest Pain    HPI Dylan Sweeney is a 46 y.o. male with history of HTN, CKD3, and asthma who presents with 1 week of constant left-sided chest pressure and dyspnea.  He has had exertional chest pressure for several years, substernal, left sided, dull squeezing pressure which is relieved by rest.  For the past week is has become constant, 3/10 in intensity, with dyspnea.  He has not been exerting himself, trying to rest and let it go away, but symptoms have persisted.  [facility], and stress test 1-2 years ago and was told his heart was thickened.  He was at the [place] 3 weeks ago and had an EKG, was told is was abnormal at that time.  HPI  Past Medical History:  Diagnosis Date  . Asthma   . Enlarged heart   . Hypertension   . Renal disorder   . Renal insufficiency   . Sickle cell anemia (HCC)    Has the trait-per patient (03/12/15)    Patient Active Problem List   Diagnosis Date Noted  . Hypertensive urgency 03/12/2015  . Hypertensive urgency, malignant 03/12/2015  . Marijuana use 03/12/2015  . Back pain   . Renal artery stenosis ([place])     History reviewed. No pertinent surgical history.     Home Medications    Prior to Admission medications   Medication Sig Start Date End Date Taking? Authorizing Provider  albuterol (PROVENTIL HFA;VENTOLIN HFA) 108 (90 BASE) MCG/ACT inhaler Inhale into the lungs every 6 (six) hours as needed for wheezing or shortness of breath.   Yes [provider]  amLODipine (NORVASC) 10 MG tablet Take 1 tablet (10 mg total) by mouth daily. 03/19/15  Yes Reyne Dumas, MD  cholecalciferol (VITAMIN D) 1000 UNITS tablet Take 1,000 Units by mouth daily.   Yes [provider]  Fluticasone-Salmeterol (ADVAIR) 250-50 MCG/DOSE AEPB Inhale 1 puff into  the lungs 2 (two) times daily.   Yes [provider]  folic acid (FOLVITE) 1 MG tablet Take 1 tablet (1 mg total) by mouth daily. 03/19/15  Yes Reyne Dumas, MD  hydrALAZINE (APRESOLINE) 100 MG tablet Take 1 tablet (100 mg total) by mouth every 8 (eight) hours. 03/19/15  Yes Reyne Dumas, MD  labetalol (NORMODYNE) 200 MG tablet Take 2 tablets (400 mg total) by mouth 2 (two) times daily. 03/19/15  Yes Reyne Dumas, MD  omega-3 acid ethyl esters (LOVAZA) 1 G capsule Take by mouth 2 (two) times daily.   Yes [provider]  polyethylene glycol (MIRALAX / GLYCOLAX) packet Take 17 g by mouth daily. 03/19/15  Yes Reyne Dumas, MD  spironolactone (ALDACTONE) 50 MG tablet Take 1 tablet (50 mg total) by mouth 2 (two) times daily. 03/19/15  Yes Reyne Dumas, MD  tamsulosin (FLOMAX) 0.4 MG CAPS capsule Take 2 capsules (0.8 mg total) by mouth daily after breakfast. 03/19/15  Yes Reyne Dumas, MD  thiamine 100 MG tablet Take 1 tablet (100 mg total) by mouth daily. 03/19/15  Yes Reyne Dumas, MD  docusate sodium (COLACE) 100 MG capsule Take 1 capsule (100 mg total) by mouth 2 (two) times daily. 03/19/15   Reyne Dumas, MD  oxyCODONE (OXY IR/ROXICODONE) 5 MG immediate release tablet Take 1 tablet (5 mg total) by mouth every 6 (six) hours as needed  for severe pain. Patient not taking: Reported on 09/11/2016 03/19/15   Reyne Dumas, MD  warfarin (COUMADIN) 5 MG tablet Take 1 tablet (5 mg total) by mouth one time only at 6 PM. Patient not taking: Reported on 09/11/2016 03/19/15   Reyne Dumas, MD    Family History No family history on file.  Social History Social History  Substance Use Topics  . Smoking status: Never Smoker  . Smokeless tobacco: Not on file  . Alcohol use Yes     Comment: 6 pack/week     Allergies   Other   Review of Systems Review of Systems  Constitutional: Positive for activity change. Negative for chills and fever.  Respiratory: Positive for apnea,  chest tightness and shortness of breath. Negative for cough and wheezing.   Cardiovascular: Positive for chest pain. Negative for palpitations and leg swelling.  Gastrointestinal: Positive for nausea. Negative for constipation, diarrhea and vomiting.  Neurological: Negative for dizziness and syncope.     Physical Exam Updated Vital Signs BP (!) 138/100   Pulse 75   Temp 98.4 F (36.9 C) (Oral)   Resp 17   SpO2 97%   Physical Exam  Constitutional: He is oriented to person, place, and time. He appears well-developed and well-nourished. No distress.  HENT:  Head: Normocephalic and atraumatic.  Eyes: Conjunctivae are normal. No scleral icterus.  Cardiovascular: Normal rate, regular rhythm and normal heart sounds.   Pulmonary/Chest: Effort normal and breath sounds normal. No respiratory distress. He has no wheezes. He has no rales. He exhibits no tenderness.  Abdominal: Soft. He exhibits no distension. There is no tenderness.  Musculoskeletal: He exhibits no edema or tenderness.  Neurological: He is alert and oriented to person, place, and time.  Skin: Skin is warm and dry.  Psychiatric: He has a normal mood and affect. His behavior is normal.     ED Treatments / Results  Labs (all labs ordered are listed, but only abnormal results are displayed) Labs Reviewed  BASIC METABOLIC PANEL - Abnormal; Notable for the following:       Result Value   Sodium 133 (*)    Chloride 100 (*)    Glucose, Bld 180 (*)    Creatinine, Ser 2.12 (*)    GFR calc non Af Amer 35 (*)    GFR calc Af Amer 41 (*)    All other components within normal limits  D-DIMER, QUANTITATIVE (NOT AT [facility]) - Abnormal; Notable for the following:    D-Dimer, Quant 1.26 (*)    All other components within normal limits  CBC  I-STAT TROPOININ, ED  I-STAT TROPOININ, ED    EKG  EKG Interpretation None       Radiology Dg Chest 2 View  Result Date: 09/11/2016 CLINICAL DATA:  Chest pain. EXAM: CHEST  2 VIEW  COMPARISON:  03/12/2015 FINDINGS: The heart size and mediastinal contours are within normal limits. Both lungs are clear. The visualized skeletal structures are unremarkable. IMPRESSION: No active cardiopulmonary disease. Electronically Signed   By: Kerby Moors M.D.   On: 09/11/2016 11:01    Procedures Procedures (including critical care time)  Medications Ordered in ED Medications  nitroGLYCERIN (NITROSTAT) SL tablet 0.4 mg (not administered)  aspirin chewable tablet 324 mg (324 mg Oral Given 09/11/16 1249)  gi cocktail (Maalox,Lidocaine,Donnatal) (30 mLs Oral Given 09/11/16 1250)     Initial Impression / Assessment and Plan / ED Course  I have reviewed the triage vital signs and the nursing notes.  Pertinent labs & imaging results that were available during my care of the patient were reviewed by me and considered in my medical decision making (see chart for details).  46 year old man with history consistent with prior stable angina and now 1 week of constant substernal chest pressure and dyspnea.  Concerning for ACS, though stable symptoms for a week is reassuring and less typical of angina.  HEART score 3, [place] low risk for PE.  No signs/symptoms of pneumonia, no wheezing to suggest RAD. -aspirin, nitro -serial troponins -CXR -D-dimer  Clinical Course as of Sep 11 2048  Fri Sep 11, 2016  1324 D-dimer positive.  Will proceed with V/Q scan with his renal insufficiency.  [MO]  [place] negative, no MI.  [MO]  2951 Care transferred to Carlisle Cater, PA-C.  [MO]    Clinical Course User Index [MO] Minus Liberty, MD    Final Clinical Impressions(s) / ED Diagnoses   Final diagnoses:  Dyspnea  Atypical chest pain     New Prescriptions New Prescriptions   No medications on file     Minus Liberty, MD 09/11/16 2050    Julianne Rice, MD 09/21/16 1404

## 2017-07-09 IMAGING — US US RENAL
1 series · 14 of 25 positions shown · non-contrast
Comparison: CT 03/14/2015

CLINICAL DATA: Hypertension, suprapubic pain.  Pelvic pain.

EXAM:
RENAL / URINARY TRACT ULTRASOUND COMPLETE

[Series 1: us renal · 0.21mm/px · 14 of 55 slices shown]
[im 1/55]
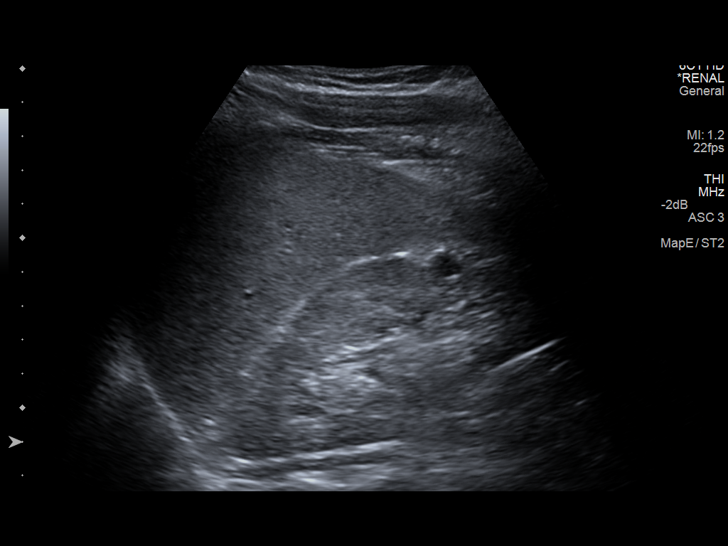
[im 5/55]
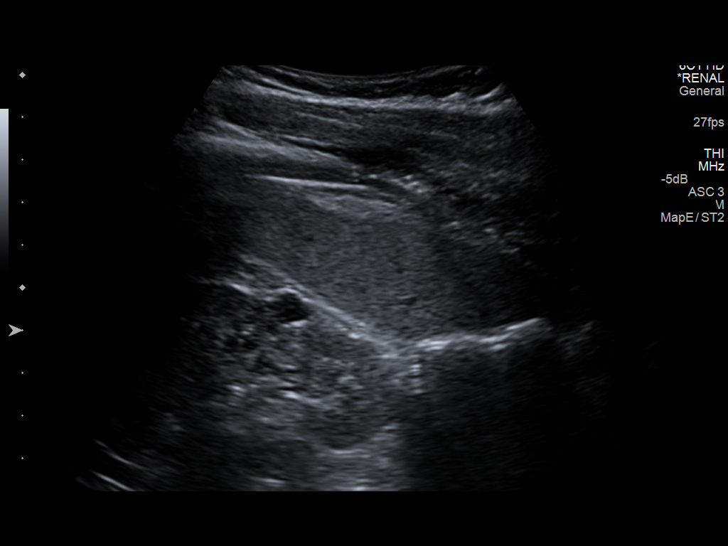
[im 10/55]
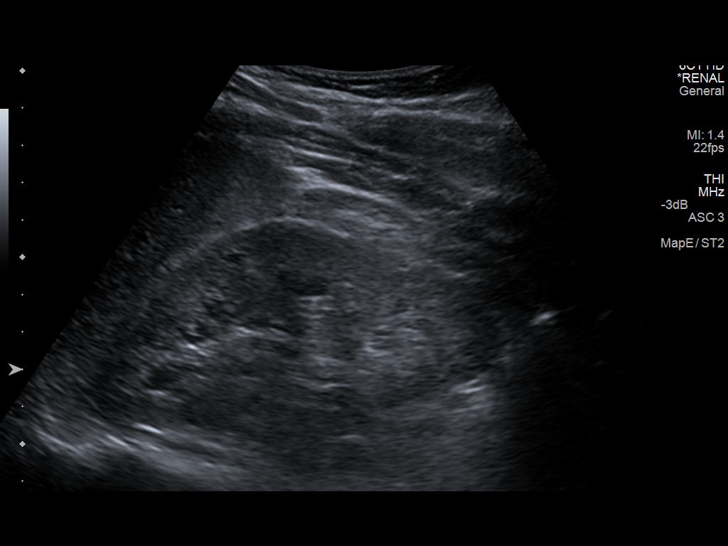
[im 14/55]
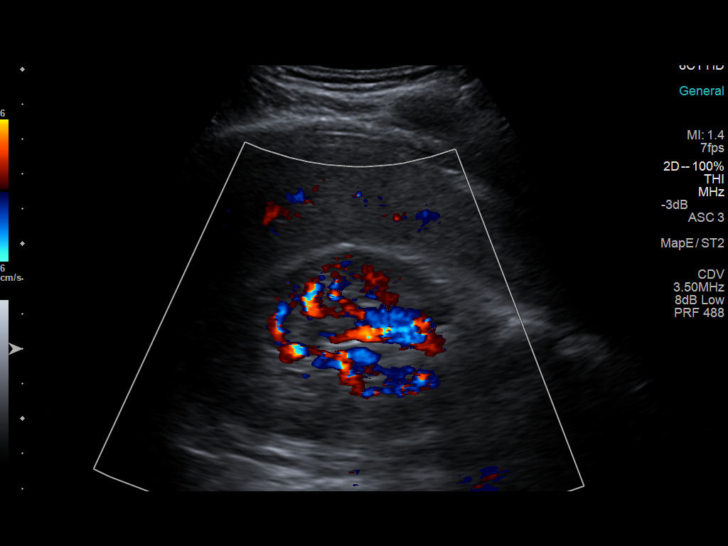
[im 19/55]
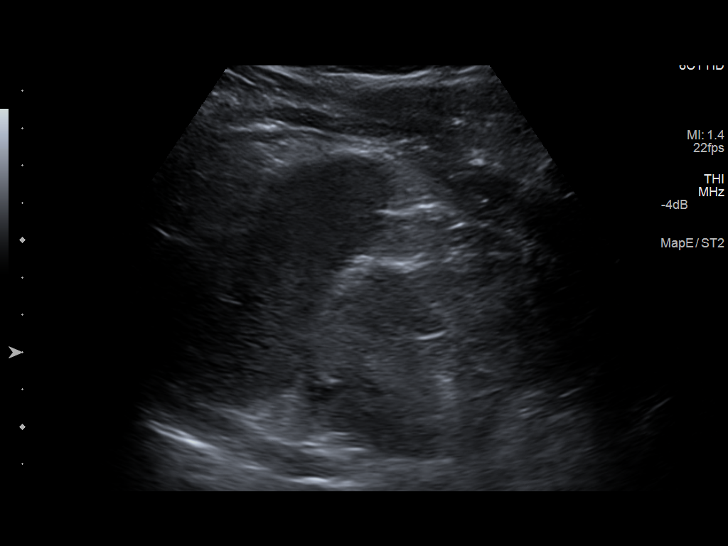
[im 21/55]
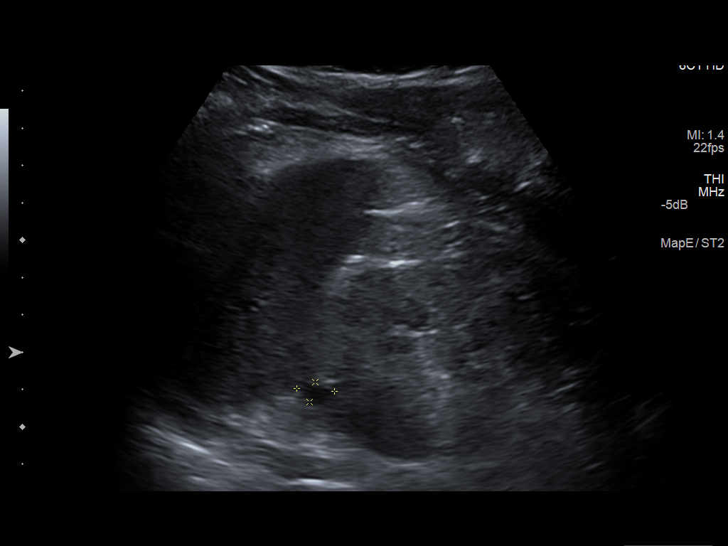
[im 25/55]
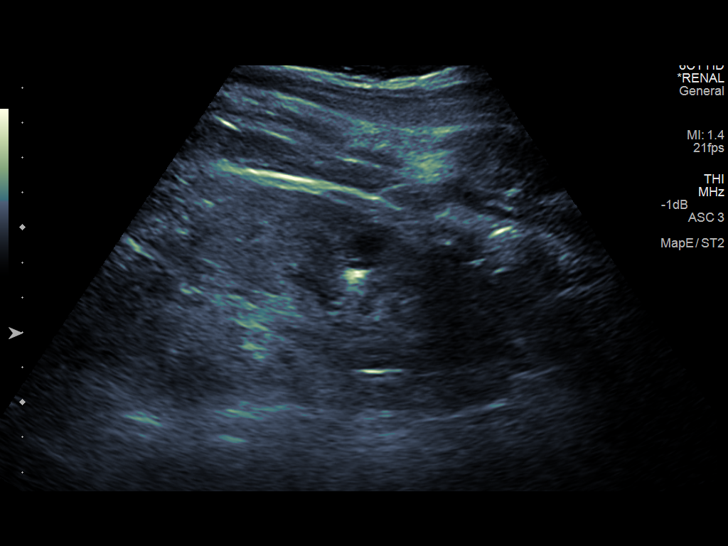
[im 30/55]
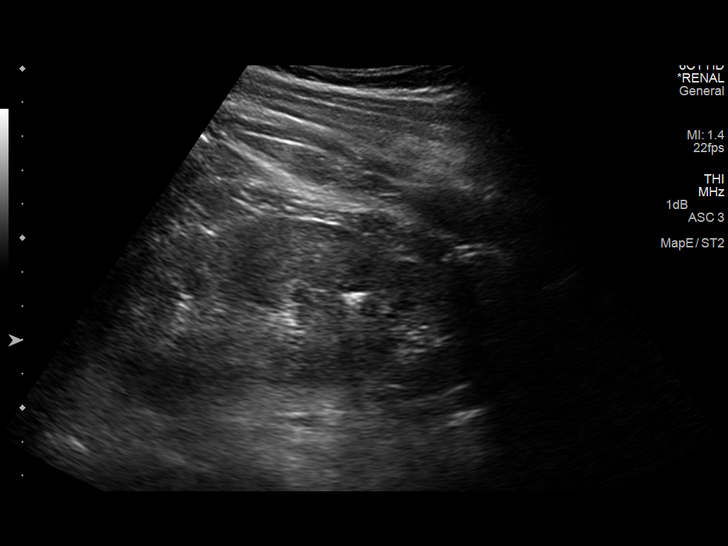
[im 34/55]
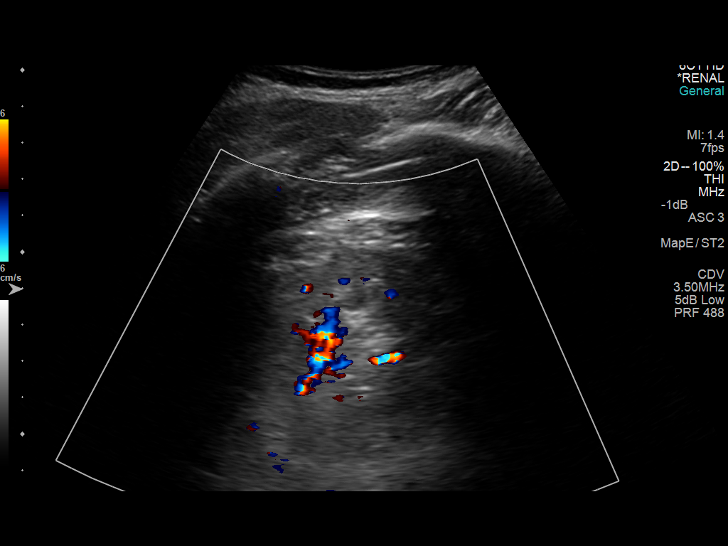
[im 37/55]
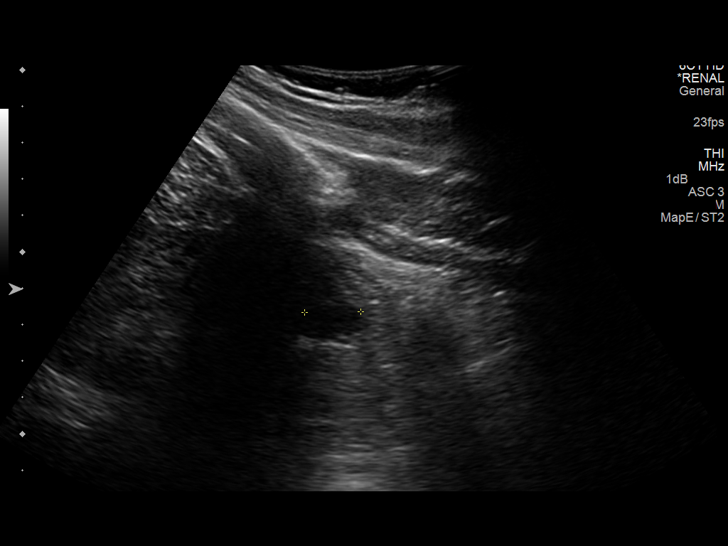
[im 41/55]
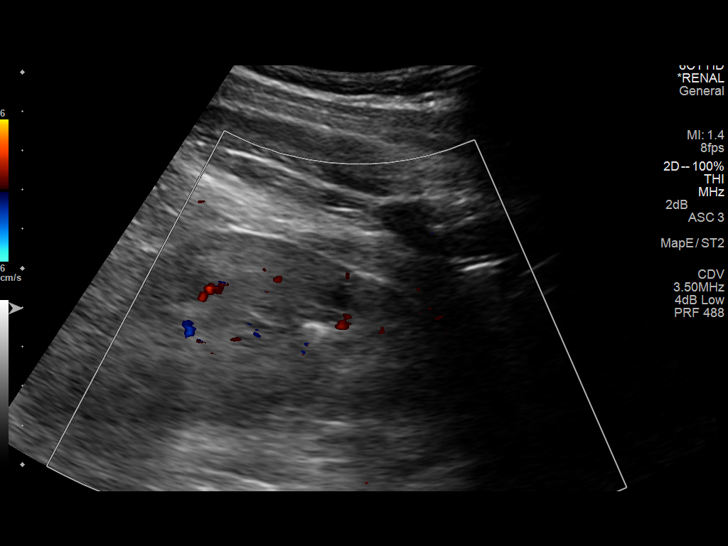
[im 46/55]
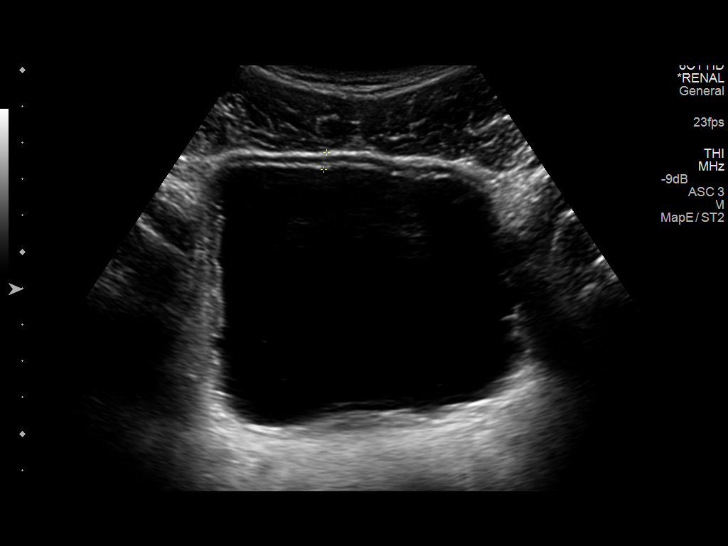
[im 50/55]
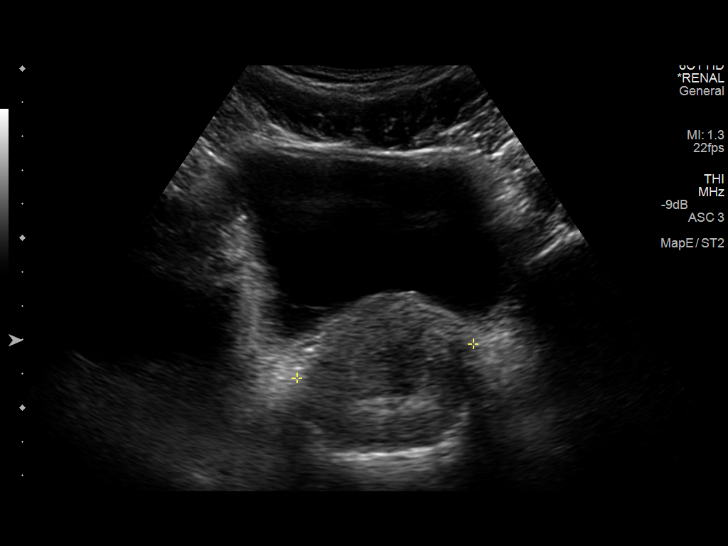
[im 55/55]
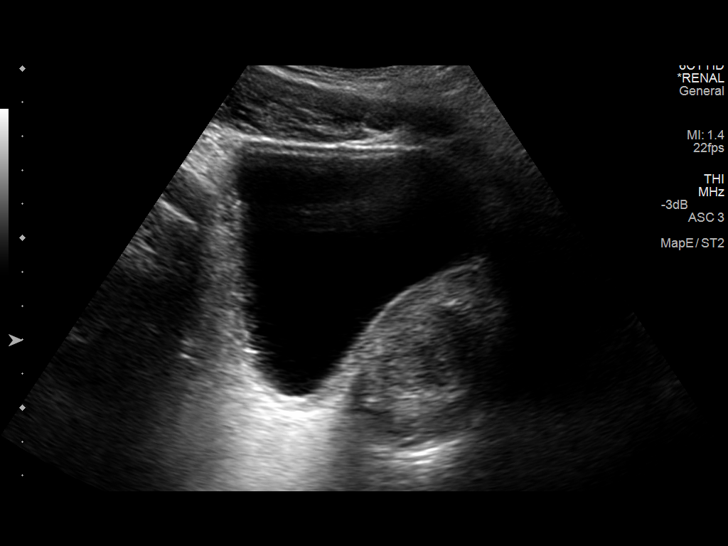

[14 of 25 positions shown; findings below may reference images not displayed]

FINDINGS: Right Kidney:

Length: 11.2 cm. Mildly increased echotexture throughout the right
kidney. Small lower pole cyst measures 8 mm. No hydronephrosis.

Left Kidney:

Length: 11.7 cm. Mildly increased echotexture. 10 mm upper pole and
11 mm lower pole cyst. No hydronephrosis.

Bladder:

Bladder wall appears mildly thickened at 5 mm.  Prostate prominent.
IMPRESSION: Mildly increased echotexture in the kidneys bilaterally suggesting
chronic medical renal disease. No hydronephrosis.

Bladder wall thickening, possibly related to some degree of bladder
outlet obstruction. Prostate mildly prominent.

## 2017-07-27 ENCOUNTER — Institutional Professional Consult (permissible substitution): Payer: Non-veteran care | Admitting: Pulmonary Disease

## 2017-08-03 ENCOUNTER — Institutional Professional Consult (permissible substitution): Payer: Non-veteran care | Admitting: Pulmonary Disease

## 2017-08-25 ENCOUNTER — Institutional Professional Consult (permissible substitution): Payer: Non-veteran care | Admitting: Pulmonary Disease

## 2017-08-27 ENCOUNTER — Ambulatory Visit (INDEPENDENT_AMBULATORY_CARE_PROVIDER_SITE_OTHER): Payer: No Typology Code available for payment source | Admitting: Pulmonary Disease

## 2017-08-27 ENCOUNTER — Encounter: Payer: Self-pay | Admitting: Pulmonary Disease

## 2017-08-27 ENCOUNTER — Other Ambulatory Visit (INDEPENDENT_AMBULATORY_CARE_PROVIDER_SITE_OTHER): Payer: No Typology Code available for payment source

## 2017-08-27 VITALS — BP 148/90 | HR 90 | Ht 71.0 in | Wt 193.0 lb

## 2017-08-27 DIAGNOSIS — R05 Cough: Secondary | ICD-10-CM

## 2017-08-27 DIAGNOSIS — R059 Cough, unspecified: Secondary | ICD-10-CM

## 2017-08-27 LAB — CBC WITH DIFFERENTIAL/PLATELET
Basophils Absolute: 0.1 10*3/uL (ref 0.0–0.1)
Basophils Relative: 0.6 % (ref 0.0–3.0)
Eosinophils Absolute: 0.1 10*3/uL (ref 0.0–0.7)
Eosinophils Relative: 1.2 % (ref 0.0–5.0)
HEMATOCRIT: 43.9 % (ref 39.0–52.0)
Hemoglobin: 15 g/dL (ref 13.0–17.0)
LYMPHS PCT: 22.2 % (ref 12.0–46.0)
Lymphs Abs: 2.1 10*3/uL (ref 0.7–4.0)
MCHC: 34.2 g/dL (ref 30.0–36.0)
MCV: 86.6 fl (ref 78.0–100.0)
MONOS PCT: 7.1 % (ref 3.0–12.0)
Monocytes Absolute: 0.7 10*3/uL (ref 0.1–1.0)
Neutro Abs: 6.5 10*3/uL (ref 1.4–7.7)
Neutrophils Relative %: 68.9 % (ref 43.0–77.0)
PLATELETS: 318 10*3/uL (ref 150.0–400.0)
RBC: 5.07 Mil/uL (ref 4.22–5.81)
RDW: 13.1 % (ref 11.5–15.5)
WBC: 9.4 10*3/uL (ref 4.0–10.5)

## 2017-08-27 LAB — NITRIC OXIDE: Nitric Oxide: 53

## 2017-08-27 NOTE — Progress Notes (Signed)
Dylan Sweeney    [id number]    11-29-1970  [facility]  Referring Physician: [facility] [address] [place], [place] [address]  Chief complaint: Consult for dyspnea, COPD  HPI: 47 year old with past medical history of hypertension, asthma, allergies, sleep apnea Diagnosed with asthma and patient therapy and has been maintained on Flovent and albuterol rescue inhaler.  Symptoms appear to be under good control.  He is using his albuterol inhaler about 3 times a week.  Denies any nocturnal awakening He has history of seasonal allergies, acid reflux. Diagnosed with OSA 2 years ago.  He is on CPAP therapy via the [place].  He is in the process of getting his full facemask changed to a nasal pillow.  There is question of COPD per primary care notes with mild obstruction on PFTs in 2017.  And he has been sent here for further assessment.  Primary care note also notes noncompliance with inhalers and medication in general. He was supposed to get a CT scan of the chest at the [place] but I do not have any record of this.  Pets: No pets, birds, farm animals Occupation: He was in [employer], academic.  Currently retired Exposures: No known exposures.  He had some mold issues in his Apt. 2 years ago.  No Jacuzzi, hot tub Smoking history: Minimal smoking history.  He smoked 1 pack/month for a year in 2010 Travel history: No recent travel  Outpatient Encounter Medications as of 08/27/2017  Medication Sig  . albuterol (PROVENTIL HFA;VENTOLIN HFA) 108 (90 BASE) MCG/ACT inhaler Inhale into the lungs every 6 (six) hours as needed for wheezing or shortness of breath.  Marland Kitchen amLODipine (NORVASC) 10 MG tablet Take 1 tablet (10 mg total) by mouth daily.  . cholecalciferol (VITAMIN D) 1000 UNITS tablet Take 1,000 Units by mouth daily.  Marland Kitchen docusate sodium (COLACE) 100 MG capsule Take 1 capsule (100 mg total) by mouth 2 (two) times daily.  . Fluticasone-Salmeterol (ADVAIR) 250-50  MCG/DOSE AEPB Inhale 1 puff into the lungs 2 (two) times daily.  . folic acid (FOLVITE) 1 MG tablet Take 1 tablet (1 mg total) by mouth daily.  . hydrALAZINE (APRESOLINE) 100 MG tablet Take 1 tablet (100 mg total) by mouth every 8 (eight) hours.  Marland Kitchen labetalol (NORMODYNE) 200 MG tablet Take 2 tablets (400 mg total) by mouth 2 (two) times daily.  Marland Kitchen omega-3 acid ethyl esters (LOVAZA) 1 G capsule Take by mouth 2 (two) times daily.  . polyethylene glycol (MIRALAX / GLYCOLAX) packet Take 17 g by mouth daily.  Marland Kitchen spironolactone (ALDACTONE) 50 MG tablet Take 1 tablet (50 mg total) by mouth 2 (two) times daily.  . tamsulosin (FLOMAX) 0.4 MG CAPS capsule Take 2 capsules (0.8 mg total) by mouth daily after breakfast.  . thiamine 100 MG tablet Take 1 tablet (100 mg total) by mouth daily.  . [DISCONTINUED] oxyCODONE (OXY IR/ROXICODONE) 5 MG immediate release tablet Take 1 tablet (5 mg total) by mouth every 6 (six) hours as needed for severe pain. (Patient not taking: Reported on 09/11/2016)   No facility-administered encounter medications on file as of 08/27/2017.     Allergies as of 08/27/2017 - Review Complete 08/27/2017  Allergen Reaction Noted  . Other Other (See Comments) 03/12/2015    Past Medical History:  Diagnosis Date  . Asthma   . Enlarged heart   . Hypertension   . Renal disorder   . Renal insufficiency   .  Sickle cell anemia (HCC)    Has the trait-per patient (03/12/15)    No past surgical history on file.  Family History  Problem Relation Age of Onset  . Heart disease Mother   . Hypertension Sister   . Asthma Sister   . Hypertension Brother     Social History   Socioeconomic History  . Marital status: Divorced    Spouse name: Not on file  . Number of children: Not on file  . Years of education: Not on file  . Highest education level: Not on file  Occupational History  . Not on file  Social Needs  . Financial resource strain: Not on file  . Food insecurity:    Worry:  Not on file    Inability: Not on file  . Transportation needs:    Medical: Not on file    Non-medical: Not on file  Tobacco Use  . Smoking status: Former Smoker    Years: 1.00  . Smokeless tobacco: Never Used  . Tobacco comment: one pack would last a month-08/27/17  Substance and Sexual Activity  . Alcohol use: Yes    Comment: 6 pack/week  . Drug use: No  . Sexual activity: Not on file  Lifestyle  . Physical activity:    Days per week: Not on file    Minutes per session: Not on file  . Stress: Not on file  Relationships  . Social connections:    Talks on phone: Not on file    Gets together: Not on file    Attends religious service: Not on file    Active member of club or organization: Not on file    Attends meetings of clubs or organizations: Not on file    Relationship status: Not on file  . Intimate partner violence:    Fear of current or ex partner: Not on file    Emotionally abused: Not on file    Physically abused: Not on file    Forced sexual activity: Not on file  Other Topics Concern  . Not on file  Social History Narrative  . Not on file    Review of systems: Review of Systems  Constitutional: Negative for fever and chills.  HENT: Negative.   Eyes: Negative for blurred vision.  Respiratory: as per HPI  Cardiovascular: Negative for chest pain and palpitations.  Gastrointestinal: Negative for vomiting, diarrhea, blood per rectum. Genitourinary: Negative for dysuria, urgency, frequency and hematuria.  Musculoskeletal: Negative for myalgias, back pain and joint pain.  Skin: Negative for itching and rash.  Neurological: Negative for dizziness, tremors, focal weakness, seizures and loss of consciousness.  Endo/Heme/Allergies: Negative for environmental allergies.  Psychiatric/Behavioral: Negative for depression, suicidal ideas and hallucinations.  All other systems reviewed and are negative.  Physical Exam: Blood pressure (!) 148/90, pulse 90, height 5\' 11"   (1.803 m), weight 193 lb (87.5 kg), SpO2 98 %. Gen:      No acute distress HEENT:  EOMI, sclera anicteric Neck:     No masses; no thyromegaly Lungs:    Clear to auscultation bilaterally; normal respiratory effort CV:         Regular rate and rhythm; no murmurs Abd:      + bowel sounds; soft, non-tender; no palpable masses, no distension Ext:    No edema; adequate peripheral perfusion Skin:      Warm and dry; no rash Neuro: alert and oriented x 3 Psych: normal mood and affect  Data Reviewed: FENO 08/27/2017- 53  PFTs 08/14/15 FVC 4.16 (95%), FEV1 2.69 (76%], post albuterol FEV1 3.01 [85%], +12%, F/F 65 TLC 93%, DLCO 118% Moderate obstruction with bronchodilator response   Assessment:  Assessment for asthma Do not suspect COPD as he has minimal smoking history. We will recheck PFTs for assessment Check CBC with differential, blood allergic peripheral Continue Flovent and albuterol inhaler  Plan/Recommendations: - PFTs, CBC, blood allergy profile - Continue Flovent and albuterol  Marshell Garfinkel MD [facility] 08/27/2017, 10:34 AM  CC: [facility]

## 2017-08-27 NOTE — Patient Instructions (Signed)
We will schedule you for pulmonary function test Check CBC differential and blood allergy profile today Continue using the Flovent and albuterol inhaler Follow-up after tests for review.

## 2017-08-30 LAB — RESPIRATORY ALLERGY PROFILE REGION II ~~LOC~~
ALLERGEN, COMM SILVER BIRCH, T3: 0.35 kU/L — AB
Allergen, A. alternata, m6: 6.74 kU/L — ABNORMAL HIGH
Allergen, Cedar tree, t12: 0.88 kU/L — ABNORMAL HIGH
Allergen, Cottonwood, t14: <0.1 kU/L
Allergen, D pternoyssinus,d7: 1.13 kU/L — ABNORMAL HIGH
Allergen, Mouse Urine Protein, e78: 0.34 kU/L — ABNORMAL HIGH
Allergen, Mulberry, t76: <0.1 kU/L
Allergen, Oak,t7: 0.23 kU/L — ABNORMAL HIGH
Allergen, P. notatum, m1: 7.29 kU/L — ABNORMAL HIGH
Aspergillus fumigatus, m3: 6.06 kU/L — ABNORMAL HIGH
BOX ELDER: 0.27 kU/L — AB
Bermuda Grass: <0.1 kU/L
CLADOSPORIUM HERBARUM (M2) IGE: 3.63 kU/L — AB
CLASS: 0
CLASS: 0
CLASS: 0
CLASS: 0
CLASS: 0
CLASS: 0
CLASS: 2
CLASS: 3
CLASS: 3
CLASS: 3
COMMON RAGWEED (SHORT) (W1) IGE: 1 kU/L — AB
Cat Dander: 0.2 kU/L — ABNORMAL HIGH
Class: 0
Class: 0
Class: 0
Class: 0
Class: 0
Class: 0
Class: 0
Class: 1
Class: 1
Class: 2
Class: 2
Class: 2
Class: 3
Class: 3
Cockroach: 0.19 kU/L — ABNORMAL HIGH
D. farinae: 4.46 kU/L — ABNORMAL HIGH
Dog Dander: 0.24 kU/L — ABNORMAL HIGH
ELM IGE: 0.48 kU/L — AB
IGE (IMMUNOGLOBULIN E), SERUM: 561 kU/L — AB (ref <=114)
JOHNSON GRASS: 0.12 kU/L — AB
PECAN/HICKORY TREE IGE: 0.11 kU/L — AB
Rough Pigweed  IgE: 0.71 kU/L — ABNORMAL HIGH
Sheep Sorrel IgE: <0.1 kU/L
Timothy Grass: 0.27 kU/L — ABNORMAL HIGH

## 2017-08-30 LAB — INTERPRETATION:

## 2017-10-01 ENCOUNTER — Ambulatory Visit: Payer: Non-veteran care | Admitting: Pulmonary Disease

## 2018-01-04 ENCOUNTER — Telehealth: Payer: Self-pay | Admitting: Pulmonary Disease

## 2018-01-04 NOTE — Telephone Encounter (Signed)
Called [place] at phone number and ext given. Unable to reach left message to give [place] a call back.

## 2018-01-05 NOTE — Telephone Encounter (Signed)
[facility]

## 2018-01-06 NOTE — Telephone Encounter (Signed)
Called and spoke to [place]. She requested that last OV note be faxed over to [phone]. Faxed over last note. Nothing further needed at this time.

## 2018-09-20 ENCOUNTER — Other Ambulatory Visit: Payer: Self-pay

## 2018-09-20 ENCOUNTER — Encounter (HOSPITAL_COMMUNITY): Payer: Self-pay

## 2018-09-20 ENCOUNTER — Inpatient Hospital Stay (HOSPITAL_COMMUNITY)
Admission: EM | Admit: 2018-09-20 | Discharge: 2018-09-23 | DRG: 305 | Disposition: A | Discharge status: Home / Self Care | Payer: No Typology Code available for payment source | Attending: Internal Medicine | Admitting: Internal Medicine

## 2018-09-20 DIAGNOSIS — N179 Acute kidney failure, unspecified: Secondary | ICD-10-CM

## 2018-09-20 DIAGNOSIS — I248 Other forms of acute ischemic heart disease: Secondary | ICD-10-CM | POA: Diagnosis present

## 2018-09-20 DIAGNOSIS — Z1159 Encounter for screening for other viral diseases: Secondary | ICD-10-CM

## 2018-09-20 DIAGNOSIS — N401 Enlarged prostate with lower urinary tract symptoms: Secondary | ICD-10-CM | POA: Diagnosis present

## 2018-09-20 DIAGNOSIS — F1010 Alcohol abuse, uncomplicated: Secondary | ICD-10-CM | POA: Diagnosis present

## 2018-09-20 DIAGNOSIS — D571 Sickle-cell disease without crisis: Secondary | ICD-10-CM | POA: Diagnosis present

## 2018-09-20 DIAGNOSIS — I161 Hypertensive emergency: Principal | ICD-10-CM | POA: Diagnosis present

## 2018-09-20 DIAGNOSIS — Z9114 Patient's other noncompliance with medication regimen: Secondary | ICD-10-CM

## 2018-09-20 DIAGNOSIS — N183 Chronic kidney disease, stage 3 unspecified: Secondary | ICD-10-CM

## 2018-09-20 DIAGNOSIS — J45909 Unspecified asthma, uncomplicated: Secondary | ICD-10-CM | POA: Diagnosis present

## 2018-09-20 DIAGNOSIS — E876 Hypokalemia: Secondary | ICD-10-CM

## 2018-09-20 DIAGNOSIS — R3129 Other microscopic hematuria: Secondary | ICD-10-CM | POA: Diagnosis present

## 2018-09-20 DIAGNOSIS — Z87891 Personal history of nicotine dependence: Secondary | ICD-10-CM

## 2018-09-20 DIAGNOSIS — I129 Hypertensive chronic kidney disease with stage 1 through stage 4 chronic kidney disease, or unspecified chronic kidney disease: Secondary | ICD-10-CM | POA: Diagnosis present

## 2018-09-20 DIAGNOSIS — R338 Other retention of urine: Secondary | ICD-10-CM | POA: Diagnosis present

## 2018-09-20 DIAGNOSIS — Z79899 Other long term (current) drug therapy: Secondary | ICD-10-CM

## 2018-09-20 DIAGNOSIS — R1110 Vomiting, unspecified: Secondary | ICD-10-CM

## 2018-09-20 DIAGNOSIS — Z7951 Long term (current) use of inhaled steroids: Secondary | ICD-10-CM

## 2018-09-20 DIAGNOSIS — E860 Dehydration: Secondary | ICD-10-CM | POA: Diagnosis present

## 2018-09-20 LAB — COMPREHENSIVE METABOLIC PANEL
ALT: 14 U/L (ref 0–44)
AST: 25 U/L (ref 15–41)
Albumin: 4 g/dL (ref 3.5–5.0)
Alkaline Phosphatase: 50 U/L (ref 38–126)
Anion gap: 12 (ref 5–15)
BUN: 30 mg/dL — ABNORMAL HIGH (ref 6–20)
CO2: 24 mmol/L (ref 22–32)
Calcium: 9.3 mg/dL (ref 8.9–10.3)
Chloride: 98 mmol/L (ref 98–111)
Creatinine, Ser: 4.76 mg/dL — ABNORMAL HIGH (ref 0.61–1.24)
GFR calc Af Amer: 15 mL/min — ABNORMAL LOW (ref >60)
GFR calc non Af Amer: 13 mL/min — ABNORMAL LOW (ref >60)
Glucose, Bld: 129 mg/dL — ABNORMAL HIGH (ref 70–99)
Potassium: 3.1 mmol/L — ABNORMAL LOW (ref 3.5–5.1)
Sodium: 134 mmol/L — ABNORMAL LOW (ref 135–145)
Total Bilirubin: 1 mg/dL (ref 0.3–1.2)
Total Protein: 7.7 g/dL (ref 6.5–8.1)

## 2018-09-20 LAB — CBC
HCT: 40.7 % (ref 39.0–52.0)
Hemoglobin: 13.5 g/dL (ref 13.0–17.0)
MCH: 28.4 pg (ref 26.0–34.0)
MCHC: 33.2 g/dL (ref 30.0–36.0)
MCV: 85.7 fL (ref 80.0–100.0)
Platelets: 289 10*3/uL (ref 150–400)
RBC: 4.75 MIL/uL (ref 4.22–5.81)
RDW: 12.2 % (ref 11.5–15.5)
WBC: 13.6 10*3/uL — ABNORMAL HIGH (ref 4.0–10.5)
nRBC: 0 % (ref 0.0–0.2)

## 2018-09-20 NOTE — ED Triage Notes (Signed)
Patient states that he has had kidney disease for years, and is now unable to void. Also c/o hypertension.

## 2018-09-21 ENCOUNTER — Observation Stay (HOSPITAL_BASED_OUTPATIENT_CLINIC_OR_DEPARTMENT_OTHER): Payer: No Typology Code available for payment source

## 2018-09-21 ENCOUNTER — Emergency Department (HOSPITAL_COMMUNITY): Payer: No Typology Code available for payment source

## 2018-09-21 ENCOUNTER — Observation Stay (HOSPITAL_COMMUNITY): Payer: No Typology Code available for payment source

## 2018-09-21 DIAGNOSIS — I161 Hypertensive emergency: Secondary | ICD-10-CM | POA: Diagnosis not present

## 2018-09-21 DIAGNOSIS — E876 Hypokalemia: Secondary | ICD-10-CM

## 2018-09-21 DIAGNOSIS — I340 Nonrheumatic mitral (valve) insufficiency: Secondary | ICD-10-CM | POA: Diagnosis not present

## 2018-09-21 DIAGNOSIS — N183 Chronic kidney disease, stage 3 unspecified: Secondary | ICD-10-CM

## 2018-09-21 DIAGNOSIS — N179 Acute kidney failure, unspecified: Secondary | ICD-10-CM

## 2018-09-21 DIAGNOSIS — R1110 Vomiting, unspecified: Secondary | ICD-10-CM

## 2018-09-21 DIAGNOSIS — I361 Nonrheumatic tricuspid (valve) insufficiency: Secondary | ICD-10-CM | POA: Diagnosis not present

## 2018-09-21 LAB — URINALYSIS, ROUTINE W REFLEX MICROSCOPIC
Bilirubin Urine: NEGATIVE
Glucose, UA: NEGATIVE mg/dL
Ketones, ur: NEGATIVE mg/dL
Leukocytes,Ua: NEGATIVE
Nitrite: NEGATIVE
Protein, ur: 100 mg/dL — AB
Specific Gravity, Urine: 1.011 (ref 1.005–1.030)
pH: 5 (ref 5.0–8.0)

## 2018-09-21 LAB — CBC
HCT: 38.3 % — ABNORMAL LOW (ref 39.0–52.0)
Hemoglobin: 12.8 g/dL — ABNORMAL LOW (ref 13.0–17.0)
MCH: 29 pg (ref 26.0–34.0)
MCHC: 33.4 g/dL (ref 30.0–36.0)
MCV: 86.7 fL (ref 80.0–100.0)
Platelets: 253 10*3/uL (ref 150–400)
RBC: 4.42 MIL/uL (ref 4.22–5.81)
RDW: 12.1 % (ref 11.5–15.5)
WBC: 11.2 10*3/uL — ABNORMAL HIGH (ref 4.0–10.5)
nRBC: 0 % (ref 0.0–0.2)

## 2018-09-21 LAB — ECHOCARDIOGRAM COMPLETE
Height: 71 in
Weight: 2875.2 oz

## 2018-09-21 LAB — TROPONIN I
Troponin I: 0.03 ng/mL (ref <0.03)
Troponin I: 0.03 ng/mL (ref <0.03)
Troponin I: 0.04 ng/mL (ref <0.03)

## 2018-09-21 LAB — MAGNESIUM: Magnesium: 1.9 mg/dL (ref 1.7–2.4)

## 2018-09-21 LAB — BASIC METABOLIC PANEL
Anion gap: 16 — ABNORMAL HIGH (ref 5–15)
BUN: 30 mg/dL — ABNORMAL HIGH (ref 6–20)
CO2: 23 mmol/L (ref 22–32)
Calcium: 9.2 mg/dL (ref 8.9–10.3)
Chloride: 98 mmol/L (ref 98–111)
Creatinine, Ser: 4.7 mg/dL — ABNORMAL HIGH (ref 0.61–1.24)
GFR calc Af Amer: 16 mL/min — ABNORMAL LOW (ref >60)
GFR calc non Af Amer: 14 mL/min — ABNORMAL LOW (ref >60)
Glucose, Bld: 114 mg/dL — ABNORMAL HIGH (ref 70–99)
Potassium: 3.1 mmol/L — ABNORMAL LOW (ref 3.5–5.1)
Sodium: 137 mmol/L (ref 135–145)

## 2018-09-21 LAB — POC OCCULT BLOOD, ED: Fecal Occult Bld: NEGATIVE

## 2018-09-21 LAB — SODIUM, URINE, RANDOM: Sodium, Ur: 40 mmol/L

## 2018-09-21 LAB — CREATININE, URINE, RANDOM: Creatinine, Urine: 160.48 mg/dL

## 2018-09-21 LAB — HIV ANTIBODY (ROUTINE TESTING W REFLEX): HIV Screen 4th Generation wRfx: NONREACTIVE

## 2018-09-21 LAB — SARS CORONAVIRUS 2 BY RT PCR (HOSPITAL ORDER, PERFORMED IN ~~LOC~~ HOSPITAL LAB): SARS Coronavirus 2: NEGATIVE

## 2018-09-21 MED ORDER — ACETAMINOPHEN 650 MG RE SUPP: 650.0000 mg | Freq: Four times a day (QID) | RECTAL | Status: DC | PRN

## 2018-09-21 MED ORDER — MOMETASONE FURO-FORMOTEROL FUM 200-5 MCG/ACT IN AERO
2.0000 | INHALATION_SPRAY | Freq: Two times a day (BID) | RESPIRATORY_TRACT | Status: DC
  Administered 2018-09-21 – 2018-09-23 (×4): 2 via RESPIRATORY_TRACT
  Filled 2018-09-21: qty 8.8

## 2018-09-21 MED ORDER — ADULT MULTIVITAMIN W/MINERALS CH
1.0000 | ORAL_TABLET | Freq: Every day | ORAL | Status: DC
  Administered 2018-09-21 – 2018-09-23 (×3): 1 via ORAL
  Filled 2018-09-21 (×3): qty 1

## 2018-09-21 MED ORDER — ALBUTEROL SULFATE HFA 108 (90 BASE) MCG/ACT IN AERS: 1.0000 | INHALATION_SPRAY | Freq: Four times a day (QID) | RESPIRATORY_TRACT | Status: DC | PRN

## 2018-09-21 MED ORDER — NITROGLYCERIN IN D5W 200-5 MCG/ML-% IV SOLN
0.0000 ug/min | INTRAVENOUS | Status: DC
  Administered 2018-09-21: 5 ug/min via INTRAVENOUS
  Filled 2018-09-21: qty 250

## 2018-09-21 MED ORDER — VITAMIN D 25 MCG (1000 UNIT) PO TABS
1000.0000 [IU] | ORAL_TABLET | Freq: Every day | ORAL | Status: DC
  Administered 2018-09-21 – 2018-09-23 (×3): 1000 [IU] via ORAL
  Filled 2018-09-21 (×3): qty 1

## 2018-09-21 MED ORDER — TRAZODONE HCL 50 MG PO TABS
50.0000 mg | ORAL_TABLET | Freq: Once | ORAL | Status: AC
  Administered 2018-09-21: 50 mg via ORAL
  Filled 2018-09-21: qty 1

## 2018-09-21 MED ORDER — HYDRALAZINE HCL 20 MG/ML IJ SOLN
20.0000 mg | Freq: Once | INTRAMUSCULAR | Status: AC
  Administered 2018-09-21: 20 mg via INTRAVENOUS
  Filled 2018-09-21: qty 1

## 2018-09-21 MED ORDER — FOLIC ACID 1 MG PO TABS
1.0000 mg | ORAL_TABLET | Freq: Every day | ORAL | Status: DC
  Administered 2018-09-21 – 2018-09-23 (×3): 1 mg via ORAL
  Filled 2018-09-21 (×3): qty 1

## 2018-09-21 MED ORDER — AMLODIPINE BESYLATE 10 MG PO TABS
10.0000 mg | ORAL_TABLET | Freq: Every day | ORAL | Status: DC
  Administered 2018-09-21 – 2018-09-22 (×2): 10 mg via ORAL
  Filled 2018-09-21 (×2): qty 1

## 2018-09-21 MED ORDER — SODIUM CHLORIDE 0.9 % IV SOLN
INTRAVENOUS | Status: AC
  Administered 2018-09-21: 06:00:00 via INTRAVENOUS

## 2018-09-21 MED ORDER — LORAZEPAM 2 MG/ML IJ SOLN: 2.0000 mg | INTRAMUSCULAR | Status: DC | PRN

## 2018-09-21 MED ORDER — ALBUTEROL SULFATE (2.5 MG/3ML) 0.083% IN NEBU: 2.5000 mg | INHALATION_SOLUTION | Freq: Four times a day (QID) | RESPIRATORY_TRACT | Status: DC | PRN

## 2018-09-21 MED ORDER — POTASSIUM CHLORIDE CRYS ER 20 MEQ PO TBCR
40.0000 meq | EXTENDED_RELEASE_TABLET | Freq: Once | ORAL | Status: AC
  Administered 2018-09-21: 40 meq via ORAL
  Filled 2018-09-21: qty 2

## 2018-09-21 MED ORDER — VITAMIN B-1 100 MG PO TABS
100.0000 mg | ORAL_TABLET | Freq: Every day | ORAL | Status: DC
  Administered 2018-09-21 – 2018-09-23 (×3): 100 mg via ORAL
  Filled 2018-09-21 (×3): qty 1

## 2018-09-21 MED ORDER — OMEGA-3-ACID ETHYL ESTERS 1 G PO CAPS
1.0000 g | ORAL_CAPSULE | Freq: Two times a day (BID) | ORAL | Status: DC
  Administered 2018-09-21 – 2018-09-23 (×5): 1 g via ORAL
  Filled 2018-09-21 (×5): qty 1

## 2018-09-21 MED ORDER — LABETALOL HCL 200 MG PO TABS
200.0000 mg | ORAL_TABLET | Freq: Two times a day (BID) | ORAL | Status: DC
  Administered 2018-09-21 (×2): 200 mg via ORAL
  Filled 2018-09-21 (×2): qty 1

## 2018-09-21 MED ORDER — ACETAMINOPHEN 325 MG PO TABS
650.0000 mg | ORAL_TABLET | Freq: Four times a day (QID) | ORAL | Status: DC | PRN
  Administered 2018-09-21: 650 mg via ORAL
  Filled 2018-09-21: qty 2

## 2018-09-21 MED ORDER — HEPARIN SODIUM (PORCINE) 5000 UNIT/ML IJ SOLN
5000.0000 [IU] | Freq: Three times a day (TID) | INTRAMUSCULAR | Status: DC
  Administered 2018-09-21 – 2018-09-22 (×2): 5000 [IU] via SUBCUTANEOUS
  Filled 2018-09-21 (×4): qty 1

## 2018-09-21 MED ORDER — ACETAMINOPHEN 325 MG PO TABS
650.0000 mg | ORAL_TABLET | Freq: Once | ORAL | Status: AC
  Administered 2018-09-21: 650 mg via ORAL
  Filled 2018-09-21: qty 2

## 2018-09-21 NOTE — Progress Notes (Signed)
Renal duplex has been completed. Refer to [facility] under chart review to view preliminary results.   09/21/2018  4:08 PM Jef Futch, Bonnye Fava

## 2018-09-21 NOTE — ED Provider Notes (Signed)
[facility] EMERGENCY DEPARTMENT Provider Note   CSN: [id number] Arrival date & time: 09/20/18  1910    History   Chief Complaint Chief Complaint  Patient presents with  . Urinary Retention    HPI Dylan Sweeney is a 48 y.o. male.     The history is provided by the patient.  Hypertension  This is a chronic problem. The current episode started more than 1 week ago. The problem occurs daily. The problem has been gradually worsening. Associated symptoms include chest pain. Nothing aggravates the symptoms. Nothing relieves the symptoms.  Patient with history of severe hypertension, renal insufficiency presents with multiple complaints.  He reports has had difficulty controlling his blood pressure despite taking multiple medications.  He is followed by the [facility].  He also reports fatigue, and had chest pain and some shortness of breath yesterday.  He also reports some decreased urine output.  He also reports he may have blood in his stool as it has been dark.  Past Medical History:  Diagnosis Date  . Asthma   . Enlarged heart   . Hypertension   . Renal disorder   . Renal insufficiency   . Sickle cell anemia (HCC)    Has the trait-per patient (03/12/15)    Patient Active Problem List   Diagnosis Date Noted  . Hypertensive urgency 03/12/2015  . Hypertensive urgency, malignant 03/12/2015  . Marijuana use 03/12/2015  . Back pain   . Renal artery stenosis ([place])     History reviewed. No pertinent surgical history.      Home Medications    Prior to Admission medications   Medication Sig Start Date End Date Taking? Authorizing Provider  albuterol (PROVENTIL HFA;VENTOLIN HFA) 108 (90 BASE) MCG/ACT inhaler Inhale into the lungs every 6 (six) hours as needed for wheezing or shortness of breath.   Yes [provider]  amLODipine (NORVASC) 10 MG tablet Take 1 tablet (10 mg total) by mouth daily. 03/19/15  Yes Reyne Dumas, MD  cholecalciferol  (VITAMIN D) 1000 UNITS tablet Take 1,000 Units by mouth daily.   Yes [provider]  Fluticasone-Salmeterol (ADVAIR) 250-50 MCG/DOSE AEPB Inhale 1 puff into the lungs 2 (two) times daily.   Yes [provider]  folic acid (FOLVITE) 1 MG tablet Take 1 tablet (1 mg total) by mouth daily. 03/19/15  Yes Reyne Dumas, MD  hydrALAZINE (APRESOLINE) 100 MG tablet Take 1 tablet (100 mg total) by mouth every 8 (eight) hours. 03/19/15  Yes Reyne Dumas, MD  labetalol (NORMODYNE) 200 MG tablet Take 2 tablets (400 mg total) by mouth 2 (two) times daily. Patient taking differently: Take 400 mg by mouth 3 (three) times daily.  03/19/15  Yes Reyne Dumas, MD  omega-3 acid ethyl esters (LOVAZA) 1 G capsule Take by mouth 2 (two) times daily.   Yes [provider]  spironolactone (ALDACTONE) 50 MG tablet Take 1 tablet (50 mg total) by mouth 2 (two) times daily. 03/19/15  Yes Reyne Dumas, MD  thiamine 100 MG tablet Take 1 tablet (100 mg total) by mouth daily. 03/19/15  Yes Reyne Dumas, MD    Family History Family History  Problem Relation Age of Onset  . Heart disease Mother   . Hypertension Sister   . Asthma Sister   . Hypertension Brother     Social History Social History   Tobacco Use  . Smoking status: Former Smoker    Years: 1.00  . Smokeless tobacco: Never Used  .  Tobacco comment: one pack would last a month-08/27/17  Substance Use Topics  . Alcohol use: Yes    Comment: 6 pack/week  . Drug use: No     Allergies   Lisinopril and Other   Review of Systems Review of Systems  Constitutional: Positive for fatigue. Negative for fever.  Cardiovascular: Positive for chest pain.  Gastrointestinal: Positive for blood in stool.  All other systems reviewed and are negative.    Physical Exam Updated Vital Signs BP (!) 191/118 (BP Location: Left Arm)   Pulse 95   Temp 99 F (37.2 C) (Oral)   Resp 18   Ht 1.803 m (5\' 11" )   Wt 86.2 kg   SpO2 97%   BMI  26.50 kg/m   Physical Exam  CONSTITUTIONAL: Well developed/well nourished HEAD: Normocephalic/atraumatic EYES: EOMI/PERRL ENMT: Mucous membranes moist NECK: supple no meningeal signs SPINE/BACK:entire spine nontender CV: S1/S2 noted, no murmurs/rubs/gallops noted LUNGS: Lungs are clear to auscultation bilaterally, no apparent distress ABDOMEN: soft, nontender, no rebound or guarding, bowel sounds noted throughout abdomen GU:no cva tenderness NEURO: Pt is awake/alert/appropriate, moves all extremitiesx4.  No facial droop.  No arm or leg drift EXTREMITIES: pulses normal/equal, full ROM SKIN: warm, color normal PSYCH: no abnormalities of mood noted, alert and oriented to situation  ED Treatments / Results  Labs (all labs ordered are listed, but only abnormal results are displayed) Labs Reviewed  COMPREHENSIVE METABOLIC PANEL - Abnormal; Notable for the following components:      Result Value   Sodium 134 (*)    Potassium 3.1 (*)    Glucose, Bld 129 (*)    BUN 30 (*)    Creatinine, Ser 4.76 (*)    GFR calc non Af Amer 13 (*)    GFR calc Af Amer 15 (*)    All other components within normal limits  CBC - Abnormal; Notable for the following components:   WBC 13.6 (*)    All other components within normal limits  TROPONIN I - Abnormal; Notable for the following components:   Troponin I 0.03 (*)    All other components within normal limits  SARS CORONAVIRUS 2 (HOSPITAL ORDER, [facility])  POC OCCULT BLOOD, ED    EKG EKG Interpretation  Date/Time:  Wednesday Sep 21 2018 00:09:51 EDT Ventricular Rate:  84 PR Interval:    QRS Duration: 90 QT Interval:  385 QTC Calculation: 456 R Axis:   64 Text Interpretation:    Sinus rhythm Left atrial enlargement LVH with secondary repolarization abnormality Abnormal ekg Confirmed by Ripley Fraise [phone]) on 09/21/2018 12:16:20 AM   Radiology Dg Chest Port 1 View  Result Date: 09/21/2018 CLINICAL DATA:   48 year old male with pain. EXAM: PORTABLE CHEST 1 VIEW COMPARISON:  Chest radiographs 09/11/2016. FINDINGS: Portable AP semi upright view at 0009 hours. Lung volumes and mediastinal contours are normal. Visualized tracheal air column is within normal limits. Allowing for portable technique the lungs are clear. No acute osseous abnormality identified. Negative visible bowel gas pattern. IMPRESSION: Negative portable chest Electronically Signed   By: Genevie Ann M.D.   On: 09/21/2018 00:44    Procedures Procedures  CRITICAL CARE Performed by: Sharyon Cable Total critical care time: 33 minutes Critical care time was exclusive of separately billable procedures and treating other patients. Critical care was necessary to treat or prevent imminent or life-threatening deterioration. Critical care was time spent personally by me on the following activities: development of treatment plan with patient  and/or surrogate as well as nursing, discussions with consultants, evaluation of patient's response to treatment, examination of patient, obtaining history from patient or surrogate, ordering and performing treatments and interventions, ordering and review of laboratory studies, ordering and review of radiographic studies, pulse oximetry and re-evaluation of patient's condition. PATIENT WITH UNCONTROLLED HYPERTENSION REQUIRING IV NITRO DRIP  Medications Ordered in ED Medications  nitroGLYCERIN 50 mg in dextrose 5 % 250 mL (0.2 mg/mL) infusion (has no administration in time range)  hydrALAZINE (APRESOLINE) injection 20 mg (20 mg Intravenous Given 09/21/18 0027)     Initial Impression / Assessment and Plan / ED Course  I have reviewed the triage vital signs and the nursing notes.  Pertinent labs & imaging results that were available during my care of the patient were reviewed by me and considered in my medical decision making (see chart for details).        1:08 AM Patient presented for multiple  complaints.  The main issues appear to be uncontrolled hypertension as well as worsening renal failure.  Previous labs in our system indicate that creatinine was 2 before, now it is over 4.  Patient thinks his last laboratory evaluation at the [facility] was in the 2-3 range. 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 However he still has significant hypertension.  He will need to be admitted for uncontrolled hypertension and worsening renal failure.  Discussed with Dr. Marlowe Sax for admission.  We agreed to start patient on nitro drip. Final Clinical Impressions(s) / ED Diagnoses   Final diagnoses:  Hypertensive emergency  AKI (acute kidney injury) [facility])    ED Discharge Orders    None       Ripley Fraise, MD 09/21/18 [phone]

## 2018-09-21 NOTE — H&P (Signed)
History and Physical    Dylan Sweeney OBS:[id number] DOB: 1971/01/17 DOA: 09/20/2018  PCP: Miguel Barrera Patient coming from: Home  Chief Complaint: High blood pressure  HPI: Dylan Sweeney is a 48 y.o. male with medical history significant of CKD 3, hypertension, asthma presenting to the hospital with a chief complaint of high blood pressure.  Patient states for the past few days his blood pressure has been very high and he is been having intermittent headaches and squeezing left-sided chest pain which lasts for about a minute each time.  He checked his blood pressure at home yesterday and it was 250/130.  Reports having chronic vision problems due to history of uncontrolled hypertension.  Reports compliance with all of his home antihypertensives.  Also reports 3-day history of vomiting and left lower quadrant abdominal pain after eating [place] food.  States he has not been able to keep any food or water down and has not urinated for the past 2 days.  States he did not have any bowel movements for 2 days but then yesterday afternoon had a dark-colored bowel movement.  He was taking doses of Pepto-Bismol the day prior to this bowel movement.  ED Course: Blood pressure 172/122 on arrival.  White count 13.6.  Potassium 3.1.  BUN 30.  Creatinine 4.7, no recent baseline.  Creatinine was 2.1 in May 2018.  FOBT negative.  COVID-19 rapid test negative.  Troponin 0.03.  EKG with new T wave inversions in leads I, V4-6.  Chest x-ray negative for acute finding.  Patient received IV hydralazine 20 mg in the ED.  Review of Systems:  All systems reviewed and apart from history of presenting illness, are negative.  Past Medical History:  Diagnosis Date  . Asthma   . Enlarged heart   . Hypertension   . Renal disorder   . Renal insufficiency   . Sickle cell anemia (HCC)    Has the trait-per patient (03/12/15)    History reviewed. No pertinent surgical history.   reports that he has quit smoking. He  quit after 1.00 year of use. He has never used smokeless tobacco. He reports current alcohol use. He reports that he does not use drugs.  Allergies  Allergen Reactions  . Lisinopril Other (See Comments)    Couldn't stop coughing  . Other Other (See Comments)    G6 pill allergy: pill given before travel in [place]    Family History  Problem Relation Age of Onset  . Heart disease Mother   . Hypertension Sister   . Asthma Sister   . Hypertension Brother     Prior to Admission medications   Medication Sig Start Date End Date Taking? Authorizing Provider  albuterol (PROVENTIL HFA;VENTOLIN HFA) 108 (90 BASE) MCG/ACT inhaler Inhale into the lungs every 6 (six) hours as needed for wheezing or shortness of breath.   Yes [provider]  amLODipine (NORVASC) 10 MG tablet Take 1 tablet (10 mg total) by mouth daily. 03/19/15  Yes Reyne Dumas, MD  cholecalciferol (VITAMIN D) 1000 UNITS tablet Take 1,000 Units by mouth daily.   Yes [provider]  Fluticasone-Salmeterol (ADVAIR) 250-50 MCG/DOSE AEPB Inhale 1 puff into the lungs 2 (two) times daily.   Yes [provider]  folic acid (FOLVITE) 1 MG tablet Take 1 tablet (1 mg total) by mouth daily. 03/19/15  Yes Reyne Dumas, MD  hydrALAZINE (APRESOLINE) 100 MG tablet Take 1 tablet (100 mg total) by mouth every 8 (eight) hours. 03/19/15  Yes Reyne Dumas, MD  labetalol (NORMODYNE) 200 MG tablet Take 2 tablets (400 mg total) by mouth 2 (two) times daily. Patient taking differently: Take 400 mg by mouth 3 (three) times daily.  03/19/15  Yes Reyne Dumas, MD  omega-3 acid ethyl esters (LOVAZA) 1 G capsule Take by mouth 2 (two) times daily.   Yes [provider]  spironolactone (ALDACTONE) 50 MG tablet Take 1 tablet (50 mg total) by mouth 2 (two) times daily. 03/19/15  Yes Reyne Dumas, MD  thiamine 100 MG tablet Take 1 tablet (100 mg total) by mouth daily. 03/19/15  Yes Reyne Dumas, MD    Physical Exam:  Vitals:   09/21/18 0245 09/21/18 0300 09/21/18 0315 09/21/18 0415  BP: (!) 160/104 (!) 146/107 (!) 130/93 (!) 158/107  Pulse: 96 (!) 105 (!) 112 96  Resp: 14 15 18    Temp:    98 F (36.7 C)  TempSrc:    Oral  SpO2: 94% 96% 93% 95%  Weight:    81.5 kg  Height:        Physical Exam  Constitutional: He is oriented to person, place, and time. He appears well-developed and well-nourished. No distress.  HENT:  Head: Normocephalic.  Mouth/Throat: Oropharynx is clear and moist.  Eyes: Pupils are equal, round, and reactive to light. EOM are normal.  Neck: Neck supple.  Cardiovascular: Normal rate, regular rhythm and intact distal pulses.  Pulmonary/Chest: Effort normal and breath sounds normal. No respiratory distress. He has no wheezes. He has no rales.  Abdominal: Soft. Bowel sounds are normal. He exhibits no distension. There is abdominal tenderness. There is guarding. There is no rebound.  Generalized tenderness to palpation with guarding  Musculoskeletal:        General: No edema.  Neurological: He is alert and oriented to person, place, and time. No cranial nerve deficit.  Speech fluent, tongue midline, no facial droop. Strength 5 out of 5 in bilateral upper and lower extremities. Sensation to light touch intact throughout.  Skin: Skin is warm and dry. He is not diaphoretic.     Labs on Admission: I have personally reviewed following labs and imaging studies  CBC: Recent Labs  Lab 09/20/18 1930 09/21/18 0343  WBC 13.6* 11.2*  HGB 13.5 12.8*  HCT 40.7 38.3*  MCV 85.7 86.7  PLT 289 782   Basic Metabolic Panel: Recent Labs  Lab 09/20/18 1930 09/21/18 0343  NA 134* 137  K 3.1* 3.1*  CL 98 98  CO2 24 23  GLUCOSE 129* 114*  BUN 30* 30*  CREATININE 4.76* 4.70*  CALCIUM 9.3 9.2  MG  --  1.9   GFR: Estimated Creatinine Clearance: 20.2 mL/min (A) (by C-G formula based on SCr of 4.7 mg/dL (H)). Liver Function Tests: Recent Labs  Lab 09/20/18 1930  AST 25  ALT  14  ALKPHOS 50  BILITOT 1.0  PROT 7.7  ALBUMIN 4.0   No results for input(s): LIPASE, AMYLASE in the last 168 hours. No results for input(s): AMMONIA in the last 168 hours. Coagulation Profile: No results for input(s): INR, PROTIME in the last 168 hours. Cardiac Enzymes: Recent Labs  Lab 09/21/18 0124 09/21/18 0343  TROPONINI 0.03* 0.03*   BNP (last 3 results) No results for input(s): PROBNP in the last 8760 hours. HbA1C: No results for input(s): HGBA1C in the last 72 hours. CBG: No results for input(s): GLUCAP in the last 168 hours. Lipid Profile: No results for input(s): CHOL, HDL, LDLCALC, TRIG, CHOLHDL, LDLDIRECT in  the last 72 hours. Thyroid Function Tests: No results for input(s): TSH, T4TOTAL, FREET4, T3FREE, THYROIDAB in the last 72 hours. Anemia Panel: No results for input(s): VITAMINB12, FOLATE, FERRITIN, TIBC, IRON, RETICCTPCT in the last 72 hours. Urine analysis:    Component Value Date/Time   COLORURINE YELLOW 08/03/2015 2103   APPEARANCEUR CLEAR 08/03/2015 2103   LABSPEC 1.006 08/03/2015 2103   PHURINE 5.5 08/03/2015 2103   GLUCOSEU NEGATIVE 08/03/2015 2103   HGBUR NEGATIVE 08/03/2015 2103   BILIRUBINUR NEGATIVE 08/03/2015 2103   KETONESUR NEGATIVE 08/03/2015 2103   PROTEINUR NEGATIVE 08/03/2015 2103   UROBILINOGEN 1.0 03/15/2015 1034   NITRITE NEGATIVE 08/03/2015 2103   LEUKOCYTESUR NEGATIVE 08/03/2015 2103    Radiological Exams on Admission: Ct Abdomen Pelvis Wo Contrast  Result Date: 09/21/2018 CLINICAL DATA:  Nausea and vomiting EXAM: CT ABDOMEN AND PELVIS WITHOUT CONTRAST TECHNIQUE: Multidetector CT imaging of the abdomen and pelvis was performed following the standard protocol without IV contrast. COMPARISON:  03/14/2015 FINDINGS: Lower chest:  No contributory findings. Hepatobiliary: No focal liver abnormality.No evidence of biliary obstruction or stone. Pancreas: Unremarkable. Spleen: Unremarkable. Adrenals/Urinary Tract: Negative adrenals. No  hydronephrosis or stone. Small renal cystic densities which were better seen on 2017 abdominal MRI. Mild scarring at the upper pole left kidney. Circumferential prominent thickness of the bladder, accentuated by under distention. Stomach/Bowel:  No obstruction. No appendicitis. Vascular/Lymphatic: No acute vascular abnormality. No mass or adenopathy. Reproductive:Symmetric thickening of the prostate. Other: No ascites or pneumoperitoneum. Small fatty umbilical hernia. Musculoskeletal: No acute abnormalities. IMPRESSION: 1. No acute finding. 2. Prominent bladder wall thickness but accentuated by under distention. Electronically Signed   By: Monte Fantasia M.D.   On: 09/21/2018 04:55   Dg Chest Port 1 View  Result Date: 09/21/2018 CLINICAL DATA:  48 year old male with pain. EXAM: PORTABLE CHEST 1 VIEW COMPARISON:  Chest radiographs 09/11/2016. FINDINGS: Portable AP semi upright view at 0009 hours. Lung volumes and mediastinal contours are normal. Visualized tracheal air column is within normal limits. Allowing for portable technique the lungs are clear. No acute osseous abnormality identified. Negative visible bowel gas pattern. IMPRESSION: Negative portable chest Electronically Signed   By: Genevie Ann M.D.   On: 09/21/2018 00:44    EKG: Independently reviewed.  Sinus rhythm, LVH.  New T wave inversions in lead I, V4-6.  Assessment/Plan Principal Problem:   Hypertensive emergency Active Problems:   AKI (acute kidney injury) ([place])   CKD (chronic kidney disease) stage 3, GFR 30-59 ml/min (HCC)   Vomiting   Hypokalemia   Hypertensive emergency Blood pressure significantly elevated in the ED and continued to be high after receiving a dose of IV hydralazine 20 mg in the ED.  Patient has been experiencing intermittent very brief episodes of chest pain for the past few days.  Troponin 0.03.  EKG with LVH and new T wave inversions in leads I, V4-6.  Chest x-ray negative for acute finding.  Currently chest  pain-free and appears comfortable on exam.  Also has AKI with creatinine 4.7.  -Cardiac monitoring -Nitroglycerin infusion -Monitor blood pressure closely.  Goal reduction in MAP by approximately 10 to 20% in the first hour and additional 5 to 15% over the next 23 hours. -Trend troponin -Echocardiogram -Repeat EKG in am  -Discussed with cardiology.  Agree with current plan.  AKI on CKD 3 BUN 30.  Creatinine 4.7, no recent baseline.  Creatinine was 2.1 in May 2018.  Suspect secondary to dehydration with home diuretic use and malignant hypertension.  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 -IV fluid hydration -CT abdomen pelvis without IV contrast to check for hydronephrosis or signs of obstruction -Renal artery Doppler ultrasound -Bladder scan -Continue to monitor BMP -Nephrology has been consulted, will see patient in a.m.  Vomiting, abdominal pain Patient reports 2-day history of vomiting and left lower quadrant abdominal pain after eating [place] food.  He had dark-colored stool after taking several doses of Pepto-Bismol.  FOBT checked in the ED negative.  His presentation could possibly be due to viral gastroenteritis, however, has generalized tenderness to palpation with guarding on exam. -CT abdomen pelvis pending  Headaches Likely related to malignant hypertension.  No focal neuro deficit on exam. -Management of hypertensive emergency as mentioned above -Tylenol PRN  Hypokalemia Potassium 3.1. -Replete potassium.  Check magnesium level.  Continue to monitor BMP.  Asthma -Stable.  No bronchospasm.  Dulera scheduled and albuterol PRN.  Alcohol use -CIWA protocol; Ativan PRN -Thiamine, folate, multivitamin  DVT prophylaxis: Subcutaneous heparin Code Status: Full code Family Communication: No family available. Disposition Plan: Anticipate discharge after clinical improvement. Consults called: Nephrology (Dr. Carolin Sicks), Cardiology (Dr. Elson Areas) Admission  status: It is my clinical opinion that referral for OBSERVATION is reasonable and necessary in this patient based on the above information provided. The aforementioned taken together are felt to place the patient at high risk for further clinical deterioration. However it is anticipated that the patient may be medically stable for discharge from the hospital within 24 to 48 hours.  The medical decision making on this patient was of high complexity and the patient is at high risk for clinical deterioration, therefore this is a level 3 visit.  Shela Leff MD [facility] Pager [phone]  If 7PM-7AM, please contact night-coverage [email or web address] Password [email or web address]  09/21/2018, 5:05 AM

## 2018-09-21 NOTE — H&P (Signed)
[facility]  HISTORY AND PHYSICAL  TIYON SANOR is an 48 y.o. male.    Attending: Dr. Justin Mend Reason for consult: Acute kidney injury  Chief Complaint: Headache decreased oral intake  HPI: Mr. Filip is a 48 year old pleasant African-American gentleman with hypertension, CKD stage III and asthma who presented to [facility] emergency department on May 20 of 2020 with concern that he was having symptoms of high blood pressure.  He reports that on Sunday he checked his blood pressure and he was hypertensive to 240s/130s.  Symptoms included left-sided chest pain, dyspnea, headache, blurry vision, dizziness, syncope, nonbilious nonbloody emesis and nausea.  States that he experiences these hypertensive episodes once about every 2 years and typically requires admission for aggressive intervention.  He endorses dietary indiscretion and medication nonadherence.  He states that there are times that he would go without taking his blood pressure medications for about 2 days if he feels well.  The last time he took his antihypertensives was Friday.  During the onset of symptoms on Sunday, he states that he increase the dose of his antihypertensives which are amlodipine, hydralazine, labetalol, spironolactone.  Currently, his only complaint is headaches since starting IV nitroglycerin.  He denies any cardiac or pulmonary symptoms presently and nausea and vomiting has resolved.  Today was the first time that he has been able to keep anything down without vomiting.  ED course: On arrival he was hypertensive to 190s/110s, tachycardic to 110s, SPO2 in the upper 90s on room air.  Treatment since arrival has included a one-time dose of IV hydralazine and IV nitroglycerin infusion.  Troponin was mildly elevated at 0.03, EKG only reviewed T wave inversions in the lateral leads, chest x-ray was unremarkable, CT abdomen and pelvis did not reveal any acute finding with exception of prominent bladder wall  thickness.  PMH: Past Medical History:  Diagnosis Date  . Asthma   . Enlarged heart   . Hypertension   . Renal disorder   . Renal insufficiency   . Sickle cell anemia (HCC)    Has the trait-per patient (03/12/15)   PSH: History reviewed. No pertinent surgical history.   Past Medical History:  Diagnosis Date  . Asthma   . Enlarged heart   . Hypertension   . Renal disorder   . Renal insufficiency   . Sickle cell anemia (HCC)    Has the trait-per patient (03/12/15)    Medications:  I have reviewed the patient's current medications.  Medications Prior to Admission  Medication Sig Dispense Refill  . albuterol (PROVENTIL HFA;VENTOLIN HFA) 108 (90 BASE) MCG/ACT inhaler Inhale into the lungs every 6 (six) hours as needed for wheezing or shortness of breath.    Marland Kitchen amLODipine (NORVASC) 10 MG tablet Take 1 tablet (10 mg total) by mouth daily. 30 tablet 2  . cholecalciferol (VITAMIN D) 1000 UNITS tablet Take 1,000 Units by mouth daily.    . Fluticasone-Salmeterol (ADVAIR) 250-50 MCG/DOSE AEPB Inhale 1 puff into the lungs 2 (two) times daily.    . folic acid (FOLVITE) 1 MG tablet Take 1 tablet (1 mg total) by mouth daily. 30 tablet 2  . hydrALAZINE (APRESOLINE) 100 MG tablet Take 1 tablet (100 mg total) by mouth every 8 (eight) hours. 90 tablet 2  . labetalol (NORMODYNE) 200 MG tablet Take 2 tablets (400 mg total) by mouth 2 (two) times daily. (Patient taking differently: Take 400 mg by mouth 3 (three) times daily. ) 60 tablet 2  . omega-3 acid  ethyl esters (LOVAZA) 1 G capsule Take by mouth 2 (two) times daily.    Marland Kitchen spironolactone (ALDACTONE) 50 MG tablet Take 1 tablet (50 mg total) by mouth 2 (two) times daily. 60 tablet 2  . thiamine 100 MG tablet Take 1 tablet (100 mg total) by mouth daily. 30 tablet 0    ALLERGIES:   Allergies  Allergen Reactions  . Lisinopril Other (See Comments)    Couldn't stop coughing  . Other Other (See Comments)    G6 pill allergy: pill given before  travel in [place]    FAM HX: Family History  Problem Relation Age of Onset  . Heart disease Mother   . Hypertension Sister   . Asthma Sister   . Hypertension Brother     Social History:   reports that he has quit smoking. He quit after 1.00 year of use. He has never used smokeless tobacco. He reports current alcohol use. He reports that he does not use drugs.  ROS: Review of Systems  Constitutional: Negative for chills and fever.  HENT: Negative for congestion, hearing loss, sore throat and tinnitus.   Eyes: Positive for blurred vision. Negative for double vision and photophobia.  Respiratory: Negative for cough, hemoptysis and shortness of breath.   Cardiovascular: Positive for chest pain. Negative for palpitations, orthopnea and leg swelling.  Gastrointestinal: Positive for abdominal pain, nausea and vomiting. Negative for heartburn.  Genitourinary: Negative.   Musculoskeletal: Negative.   Skin: Negative for itching and rash.  Neurological: Positive for dizziness and headaches. Negative for seizures and weakness.  Psychiatric/Behavioral: Negative.     Blood pressure (!) 170/110, pulse 90, temperature 98.1 F (36.7 C), temperature source Oral, resp. rate 18, height 5\' 11"  (1.803 m), weight 81.5 kg, SpO2 97 %. PHYSICAL EXAM: Physical Exam  Nursing note and vitals reviewed. Constitutional: He appears well-developed and well-nourished. No distress.  HENT:  Head: Normocephalic and atraumatic.  Eyes: Conjunctivae are normal.  Neck: Normal range of motion. Neck supple.  Cardiovascular: Normal rate, regular rhythm and normal heart sounds. Exam reveals no gallop and no friction rub.  No murmur heard. Respiratory: Effort normal and breath sounds normal. No respiratory distress. He has no wheezes. He has no rales.  GI: Soft. Bowel sounds are normal. He exhibits no distension. There is no abdominal tenderness. There is no guarding.  Musculoskeletal: Normal range of motion.         General: No edema.  Skin: He is not diaphoretic.  Psychiatric: He has a normal mood and affect. His behavior is normal.     Assessment/Plan   Mr. Haddix is a 49 year old pleasant African-American gentleman with hypertension, CKD stage III, renal artery dissection (2016) and asthma for management of hypertension also found to have an acute on chronic renal injury.  1.  Hypertensive emergency: Markedly elevated blood pressure in the 190s/130s with associated symptoms of chest pain, headache, lightheadedness, dizziness.  EKG with left ventricular hypertrophy, T wave inversions in the lateral leads, troponin minimally elevated at 0.03 with endorgan damage noticeable on renal function panel showing increased creatinine from baseline.  In the ED he received a one-time dose of IV hydralazine and was placed on nitroglycerin infusion with target reduction in map 5 to 50% in first 24 hours.  His hypertensive emergency most likely secondary to dietary indiscretion and medication non-adherence.  -Continue nitroglycerin infusion  -Continue cardiac monitoring -Follow-up renal artery Doppler -Follow-up echocardiogram . 2.  Prerenal azotemia       Acute on chronic  CKD stage III-IV Baseline serum Cr 1.8-2.8.  Arrival to the ED serum creatinine was measured at 4.7.  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  No evidence of obstruction as bladder scan showed 162 cc.  He was started on continuous IVF at 125 mL/hour for 12 hours -Follow-up renal ultrasound -Follow-up urine sodium and creatinine -Avoid nephrotoxins, NSAIDs, ACE inhibitors, ARB's, potassium sparing diuretics, IV contrast -Follow-up daily BMP -Strict intake and output  3.  Hypokalemia: Status post K-Dur 40 mEq  4.  Alcohol use disorder: Continue CIWA protocol with as needed Ativan, thiamine, folate and multivitamin    Arica Bevilacqua  K Sencere Symonette 09/21/2018, 9:04 AM

## 2018-09-21 NOTE — Progress Notes (Signed)
  Echocardiogram 2D Echocardiogram has been performed.  Dylan Sweeney 09/21/2018, 11:50 AM

## 2018-09-21 NOTE — Progress Notes (Signed)
Dylan Sweeney is a 48 y.o. male with medical history significant of CKD 3, hypertension, asthma presenting to the hospital with a chief complaint of high blood pressure. He was admitted for hypertensive crisis, was started on nitroglycerin gtt.   Please see Dr Elon Jester note for detailed H&p this am.    Hosie Poisson, MD [phone]

## 2018-09-21 NOTE — ED Notes (Signed)
ED TO INPATIENT HANDOFF REPORT  ED Nurse Name and Phone #:  Herbie Baltimore RN   S Name/Age/Gender Dylan Sweeney 48 y.o. male Room/Bed: TRAAC/TRAAC  Code Status   Code Status: Prior  Home/SNF/Other ; Home  {Patient oriented : Person/Place/Time / Situation  Is this baseline? Yes  Triage Complete: Triage complete  Chief Complaint high blood pressure  Triage Note Patient states that he has had kidney disease for years, and is now unable to void. Also c/o hypertension.   Allergies Allergies  Allergen Reactions  . Lisinopril Other (See Comments)    Couldn't stop coughing  . Other Other (See Comments)    G6 pill allergy: pill given before travel in [place]    Level of Care/Admitting Diagnosis ED Disposition    ED Disposition Condition Rea: [place] [[id number]]  Level of Care: Progressive [102]  I expect the patient will be discharged within 24 hours: No (not a candidate for 5C-Observation unit)  Covid Evaluation: N/A  Diagnosis: Hypertensive emergency [id number]  Admitting Physician: Shela Leff [[id number]]  Attending Physician: Shela Leff [[id number]]  PT Class (Do Not Modify): Observation [104]  PT Acc Code (Do Not Modify): Observation [10022]       B Medical/Surgery History Past Medical History:  Diagnosis Date  . Asthma   . Enlarged heart   . Hypertension   . Renal disorder   . Renal insufficiency   . Sickle cell anemia (HCC)    Has the trait-per patient (03/12/15)   History reviewed. No pertinent surgical history.   A IV Location/Drains/Wounds Patient Lines/Drains/Airways Status   Active Line/Drains/Airways    Name:   Placement date:   Placement time:   Site:   Days:   Peripheral IV 09/20/18 Left Antecubital   09/20/18    2349    Antecubital   1          Intake/Output Last 24 hours No intake or output data in the 24 hours ending 09/21/18 0324  Labs/Imaging Results for orders placed or performed  during the hospital encounter of 09/20/18 (from the past 48 hour(s))  Comprehensive metabolic panel     Status: Abnormal   Collection Time: 09/20/18  7:30 PM  Result Value Ref Range   Sodium 134 (L) 135 - 145 mmol/L   Potassium 3.1 (L) 3.5 - 5.1 mmol/L   Chloride 98 98 - 111 mmol/L   CO2 24 22 - 32 mmol/L   Glucose, Bld 129 (H) 70 - 99 mg/dL   BUN 30 (H) 6 - 20 mg/dL   Creatinine, Ser 4.76 (H) 0.61 - 1.24 mg/dL   Calcium 9.3 8.9 - 10.3 mg/dL   Total Protein 7.7 6.5 - 8.1 g/dL   Albumin 4.0 3.5 - 5.0 g/dL   AST 25 15 - 41 U/L   ALT 14 0 - 44 U/L   Alkaline Phosphatase 50 38 - 126 U/L   Total Bilirubin 1.0 0.3 - 1.2 mg/dL   GFR calc non Af Amer 13 (L) >60 mL/min   GFR calc Af Amer 15 (L) >60 mL/min   Anion gap 12 5 - 15    Comment: Performed at [facility], [address] [place], [place] [address]  CBC     Status: Abnormal   Collection Time: 09/20/18  7:30 PM  Result Value Ref Range   WBC 13.6 (H) 4.0 - 10.5 K/uL   RBC 4.75 4.22 - 5.81 MIL/uL   Hemoglobin 13.5 13.0 -  17.0 g/dL   HCT 40.7 39.0 - 52.0 %   MCV 85.7 80.0 - 100.0 fL   MCH 28.4 26.0 - 34.0 pg   MCHC 33.2 30.0 - 36.0 g/dL   RDW 12.2 11.5 - 15.5 %   Platelets 289 150 - 400 K/uL   nRBC 0.0 0.0 - 0.2 %    Comment: Performed at [facility], [place] [address] [place], [place] [address]  POC occult blood, ED     Status: None   Collection Time: 09/21/18 12:11 AM  Result Value Ref Range   Fecal Occult Bld NEGATIVE NEGATIVE  SARS Coronavirus 2 (CEPHEID - Performed in [place] hospital lab), Hosp Order     Status: None   Collection Time: 09/21/18 12:40 AM  Result Value Ref Range   SARS Coronavirus 2 NEGATIVE NEGATIVE    Comment: (NOTE) If result is NEGATIVE SARS-CoV-2 target nucleic acids are NOT DETECTED. The SARS-CoV-2 RNA is generally detectable in upper and lower  respiratory specimens during the acute phase of infection. The lowest  concentration of SARS-CoV-2 viral copies this assay can detect is  250  copies / mL. A negative result does not preclude SARS-CoV-2 infection  and should not be used as the sole basis for treatment or other  patient management decisions.  A negative result may occur with  improper specimen collection / handling, submission of specimen other  than nasopharyngeal swab, presence of viral mutation(s) within the  areas targeted by this assay, and inadequate number of viral copies  (<250 copies / mL). A negative result must be combined with clinical  observations, patient history, and epidemiological information. If result is POSITIVE SARS-CoV-2 target nucleic acids are DETECTED. The SARS-CoV-2 RNA is generally detectable in upper and lower  respiratory specimens dur ing the acute phase of infection.  Positive  results are indicative of active infection with SARS-CoV-2.  Clinical  correlation with patient history and other diagnostic information is  necessary to determine patient infection status.  Positive results do  not rule out bacterial infection or co-infection with other viruses. If result is PRESUMPTIVE POSTIVE SARS-CoV-2 nucleic acids MAY BE PRESENT.   A presumptive positive result was obtained on the submitted specimen  and confirmed on repeat testing.  While 2019 novel coronavirus  (SARS-CoV-2) nucleic acids may be present in the submitted sample  additional confirmatory testing may be necessary for epidemiological  and / or clinical management purposes  to differentiate between  SARS-CoV-2 and other Sarbecovirus currently known to infect humans.  If clinically indicated additional testing with an alternate test  methodology [phone]) is advised. The SARS-CoV-2 RNA is generally  detectable in upper and lower respiratory sp ecimens during the acute  phase of infection. The expected result is Negative. Fact Sheet for Patients:  StrictlyIdeas.no Fact Sheet for Healthcare  Providers: BankingDealers.co.za This test is not yet approved or cleared by the [place] FDA and has been authorized for detection and/or diagnosis of SARS-CoV-2 by FDA under an Emergency Use Authorization (EUA).  This EUA will remain in effect (meaning this test can be used) for the duration of the COVID-19 declaration under Section 564(b)(1) of the Act, 21 U.S.C. section 360bbb-3(b)(1), unless the authorization is terminated or revoked sooner. Performed at [facility], [place] [address] [place], [place] [address]   Troponin I - ONCE - STAT     Status: Abnormal   Collection Time: 09/21/18  1:24 AM  Result Value Ref Range   Troponin I  0.03 (HH) <0.03 ng/mL    Comment: CRITICAL RESULT CALLED TO, READ BACK BY AND VERIFIED WITH: Azzan Butler,R RN 09/21/2018 0222  jordans Performed at [facility], [place] [address], [place], [place] [address]    Dg Chest Port 1 View  Result Date: 09/21/2018 CLINICAL DATA:  48 year old male with pain. EXAM: PORTABLE CHEST 1 VIEW COMPARISON:  Chest radiographs 09/11/2016. FINDINGS: Portable AP semi upright view at 0009 hours. Lung volumes and mediastinal contours are normal. Visualized tracheal air column is within normal limits. Allowing for portable technique the lungs are clear. No acute osseous abnormality identified. Negative visible bowel gas pattern. IMPRESSION: Negative portable chest Electronically Signed   By: Genevie Ann M.D.   On: 09/21/2018 00:44    Pending Labs Unresulted Labs (From admission, onward)   None      Vitals/Pain Today's Vitals   09/21/18 0234 09/21/18 0245 09/21/18 0300 09/21/18 0315  BP:  (!) 160/104 (!) 146/107 (!) 130/93  Pulse:  96 (!) 105 (!) 112  Resp:  14 15 18   Temp:      TempSrc:      SpO2:  94% 96% 93%  Weight:      Height:      PainSc: Asleep       Isolation Precautions No active isolations  Medications Medications  nitroGLYCERIN 50 mg in dextrose 5 % 250 mL (0.2 mg/mL)  infusion (5 mcg/min Intravenous New Bag/Given 09/21/18 0316)  hydrALAZINE (APRESOLINE) injection 20 mg (20 mg Intravenous Given 09/21/18 0027)  acetaminophen (TYLENOL) tablet 650 mg (650 mg Oral Given 09/21/18 0321)    Mobility walks Low fall risk   Focused Assessments pATIENT DENIES CHEST PAIN/RESPIRATIONS UNLABORED .   R Recommendations: See Admitting Provider Note  Report given to:   Additional Notes:

## 2018-09-21 NOTE — ED Notes (Signed)
Attempted to call report

## 2018-09-22 DIAGNOSIS — Z79899 Other long term (current) drug therapy: Secondary | ICD-10-CM | POA: Diagnosis not present

## 2018-09-22 DIAGNOSIS — Z87891 Personal history of nicotine dependence: Secondary | ICD-10-CM | POA: Diagnosis not present

## 2018-09-22 DIAGNOSIS — N179 Acute kidney failure, unspecified: Secondary | ICD-10-CM

## 2018-09-22 DIAGNOSIS — I248 Other forms of acute ischemic heart disease: Secondary | ICD-10-CM | POA: Diagnosis present

## 2018-09-22 DIAGNOSIS — E876 Hypokalemia: Secondary | ICD-10-CM | POA: Diagnosis present

## 2018-09-22 DIAGNOSIS — Z7951 Long term (current) use of inhaled steroids: Secondary | ICD-10-CM | POA: Diagnosis not present

## 2018-09-22 DIAGNOSIS — Z1159 Encounter for screening for other viral diseases: Secondary | ICD-10-CM | POA: Diagnosis not present

## 2018-09-22 DIAGNOSIS — R3129 Other microscopic hematuria: Secondary | ICD-10-CM | POA: Diagnosis present

## 2018-09-22 DIAGNOSIS — N401 Enlarged prostate with lower urinary tract symptoms: Secondary | ICD-10-CM | POA: Diagnosis present

## 2018-09-22 DIAGNOSIS — I161 Hypertensive emergency: Secondary | ICD-10-CM | POA: Diagnosis present

## 2018-09-22 DIAGNOSIS — E860 Dehydration: Secondary | ICD-10-CM | POA: Diagnosis present

## 2018-09-22 DIAGNOSIS — R338 Other retention of urine: Secondary | ICD-10-CM | POA: Diagnosis present

## 2018-09-22 DIAGNOSIS — Z9114 Patient's other noncompliance with medication regimen: Secondary | ICD-10-CM | POA: Diagnosis not present

## 2018-09-22 DIAGNOSIS — J45909 Unspecified asthma, uncomplicated: Secondary | ICD-10-CM | POA: Diagnosis present

## 2018-09-22 DIAGNOSIS — D571 Sickle-cell disease without crisis: Secondary | ICD-10-CM | POA: Diagnosis present

## 2018-09-22 DIAGNOSIS — F1010 Alcohol abuse, uncomplicated: Secondary | ICD-10-CM | POA: Diagnosis present

## 2018-09-22 DIAGNOSIS — N183 Chronic kidney disease, stage 3 (moderate): Secondary | ICD-10-CM | POA: Diagnosis present

## 2018-09-22 DIAGNOSIS — I129 Hypertensive chronic kidney disease with stage 1 through stage 4 chronic kidney disease, or unspecified chronic kidney disease: Secondary | ICD-10-CM | POA: Diagnosis present

## 2018-09-22 LAB — CBC
HCT: 38.9 % — ABNORMAL LOW (ref 39.0–52.0)
Hemoglobin: 13 g/dL (ref 13.0–17.0)
MCH: 28.8 pg (ref 26.0–34.0)
MCHC: 33.4 g/dL (ref 30.0–36.0)
MCV: 86.3 fL (ref 80.0–100.0)
Platelets: 260 10*3/uL (ref 150–400)
RBC: 4.51 MIL/uL (ref 4.22–5.81)
RDW: 12.1 % (ref 11.5–15.5)
WBC: 9.5 10*3/uL (ref 4.0–10.5)
nRBC: 0 % (ref 0.0–0.2)

## 2018-09-22 LAB — RENAL FUNCTION PANEL
Albumin: 3.5 g/dL (ref 3.5–5.0)
Anion gap: 12 (ref 5–15)
BUN: 29 mg/dL — ABNORMAL HIGH (ref 6–20)
CO2: 23 mmol/L (ref 22–32)
Calcium: 9.1 mg/dL (ref 8.9–10.3)
Chloride: 102 mmol/L (ref 98–111)
Creatinine, Ser: 3.88 mg/dL — ABNORMAL HIGH (ref 0.61–1.24)
GFR calc Af Amer: 20 mL/min — ABNORMAL LOW (ref >60)
GFR calc non Af Amer: 17 mL/min — ABNORMAL LOW (ref >60)
Glucose, Bld: 106 mg/dL — ABNORMAL HIGH (ref 70–99)
Phosphorus: 3.4 mg/dL (ref 2.5–4.6)
Potassium: 3.7 mmol/L (ref 3.5–5.1)
Sodium: 137 mmol/L (ref 135–145)

## 2018-09-22 MED ORDER — HYDRALAZINE HCL 25 MG PO TABS
25.0000 mg | ORAL_TABLET | Freq: Three times a day (TID) | ORAL | Status: DC
  Administered 2018-09-22: 25 mg via ORAL
  Filled 2018-09-22: qty 1

## 2018-09-22 MED ORDER — TRAZODONE HCL 50 MG PO TABS
50.0000 mg | ORAL_TABLET | Freq: Once | ORAL | Status: AC
  Administered 2018-09-22: 22:00:00 50 mg via ORAL
  Filled 2018-09-22: qty 1

## 2018-09-22 MED ORDER — LABETALOL HCL 200 MG PO TABS
400.0000 mg | ORAL_TABLET | Freq: Two times a day (BID) | ORAL | Status: DC
  Administered 2018-09-22 – 2018-09-23 (×3): 400 mg via ORAL
  Filled 2018-09-22 (×3): qty 2

## 2018-09-22 MED ORDER — POTASSIUM CHLORIDE CRYS ER 20 MEQ PO TBCR
40.0000 meq | EXTENDED_RELEASE_TABLET | Freq: Once | ORAL | Status: AC
  Administered 2018-09-22: 08:00:00 40 meq via ORAL
  Filled 2018-09-22: qty 2

## 2018-09-22 MED ORDER — POLYETHYLENE GLYCOL 3350 17 G PO PACK
17.0000 g | PACK | Freq: Every day | ORAL | Status: DC
  Administered 2018-09-22: 22:00:00 17 g via ORAL
  Filled 2018-09-22 (×2): qty 1

## 2018-09-22 MED ORDER — POTASSIUM CHLORIDE CRYS ER 20 MEQ PO TBCR: 40.0000 meq | EXTENDED_RELEASE_TABLET | Freq: Two times a day (BID) | ORAL | Status: DC

## 2018-09-22 MED ORDER — HYDRALAZINE HCL 50 MG PO TABS
50.0000 mg | ORAL_TABLET | Freq: Three times a day (TID) | ORAL | Status: DC
  Administered 2018-09-22: 50 mg via ORAL
  Filled 2018-09-22: qty 1

## 2018-09-22 MED ORDER — HYDRALAZINE HCL 50 MG PO TABS
100.0000 mg | ORAL_TABLET | Freq: Three times a day (TID) | ORAL | Status: DC
  Administered 2018-09-22 – 2018-09-23 (×3): 100 mg via ORAL
  Filled 2018-09-22 (×3): qty 2

## 2018-09-22 NOTE — Progress Notes (Signed)
PROGRESS NOTE    Dylan Sweeney  PJK:[id number] DOB: 11/28/1970 DOA: 09/20/2018 PCP: [facility]   Brief Narrative:   Dylan Sweeney is a 48 y.o. male with medical history significant of CKD 3, hypertension, asthma presenting to the hospital with a chief complaint of high blood pressure.  Patient states for the past few days his blood pressure has been very high and he is been having intermittent headaches and squeezing left-sided chest pain Assessment & Plan:   Principal Problem:   Hypertensive emergency Active Problems:   AKI (acute kidney injury) ([facility])   CKD (chronic kidney disease) stage 3, GFR 30-59 ml/min (HCC)   Vomiting   Hypokalemia   Hypertensive emergency:  Improved.  Transitioned off nitro gtt.  Increased the hydralazine 100 mg TID , LABETAOLOL 400 MG BID, amlodipine 10 mg daily.  Monitor overnight on these increased doses and possible d/c in am.   AKI on stage 3 CKD.  Slight improvement in the creatinine from 4 to 3.8.  Appreciate nephrology recommendations.  Reviewed renal artery duplex.    Elevated troponin; from demand ischemia from hypertensive crisis.        DVT prophylaxis: HEPARIN.  Code Status: full code.  Family Communication:none at bed side.  Disposition Plan: possible d/c in am.   Consultants:   Nephrology.   Procedures: renal artery duplex.  Antimicrobials: none.   Subjective: Headache is improving, but not resolved.   Objective: Vitals:   09/22/18 0603 09/22/18 0808 09/22/18 0859 09/22/18 1400  BP:  (!) 168/121  (!) 153/114  Pulse:  87    Resp:      Temp:      TempSrc:      SpO2:   95%   Weight: 81.7 kg     Height:        Intake/Output Summary (Last 24 hours) at 09/22/2018 1411 Last data filed at 09/22/2018 1400 Gross per 24 hour  Intake 1300 ml  Output 1640 ml  Net -340 ml   Filed Weights   09/20/18 1917 09/21/18 0415 09/22/18 0603  Weight: 86.2 kg 81.5 kg 81.7 kg    Examination:  General exam: Appears  calm and comfortable  Respiratory system: Clear to auscultation. Respiratory effort normal. Cardiovascular system: S1 & S2 heard, RRR. No JVD, murmurs, rubs, gallops or clicks. No pedal edema. Gastrointestinal system: Abdomen is nondistended, soft and nontender. No organomegaly or masses felt. Normal bowel sounds heard. Central nervous system: Alert and oriented. No focal neurological deficits. Extremities: Symmetric 5 x 5 power. Skin: No rashes, lesions or ulcers Psychiatry: Judgement and insight appear normal. Mood & affect appropriate.     Data Reviewed: I have personally reviewed following labs and imaging studies  CBC: Recent Labs  Lab 09/20/18 1930 09/21/18 0343 09/22/18 0654  WBC 13.6* 11.2* 9.5  HGB 13.5 12.8* 13.0  HCT 40.7 38.3* 38.9*  MCV 85.7 86.7 86.3  PLT 289 253 809   Basic Metabolic Panel: Recent Labs  Lab 09/20/18 1930 09/21/18 0343 09/22/18 0654  NA 134* 137 137  K 3.1* 3.1* 3.7  CL 98 98 102  CO2 24 23 23   GLUCOSE 129* 114* 106*  BUN 30* 30* 29*  CREATININE 4.76* 4.70* 3.88*  CALCIUM 9.3 9.2 9.1  MG  --  1.9  --   PHOS  --   --  3.4   GFR: Estimated Creatinine Clearance: 24.5 mL/min (A) (by C-G formula based on SCr of 3.88 mg/dL (H)). Liver Function Tests: Recent Labs  Lab 09/20/18 1930 09/22/18 0654  AST 25  --   ALT 14  --   ALKPHOS 50  --   BILITOT 1.0  --   PROT 7.7  --   ALBUMIN 4.0 3.5   No results for input(s): LIPASE, AMYLASE in the last 168 hours. No results for input(s): AMMONIA in the last 168 hours. Coagulation Profile: No results for input(s): INR, PROTIME in the last 168 hours. Cardiac Enzymes: Recent Labs  Lab 09/21/18 0124 09/21/18 0343 09/21/18 1449  TROPONINI 0.03* 0.03* 0.04*   BNP (last 3 results) No results for input(s): PROBNP in the last 8760 hours. HbA1C: No results for input(s): HGBA1C in the last 72 hours. CBG: No results for input(s): GLUCAP in the last 168 hours. Lipid Profile: No results for  input(s): CHOL, HDL, LDLCALC, TRIG, CHOLHDL, LDLDIRECT in the last 72 hours. Thyroid Function Tests: No results for input(s): TSH, T4TOTAL, FREET4, T3FREE, THYROIDAB in the last 72 hours. Anemia Panel: No results for input(s): VITAMINB12, FOLATE, FERRITIN, TIBC, IRON, RETICCTPCT in the last 72 hours. Sepsis Labs: No results for input(s): PROCALCITON, LATICACIDVEN in the last 168 hours.  Recent Results (from the past 240 hour(s))  SARS Coronavirus 2 (CEPHEID - Performed in [facility] hospital lab), Hosp Order     Status: None   Collection Time: 09/21/18 12:40 AM  Result Value Ref Range Status   SARS Coronavirus 2 NEGATIVE NEGATIVE Final    Comment: (NOTE) If result is NEGATIVE SARS-CoV-2 target nucleic acids are NOT DETECTED. The SARS-CoV-2 RNA is generally detectable in upper and lower  respiratory specimens during the acute phase of infection. The lowest  concentration of SARS-CoV-2 viral copies this assay can detect is 250  copies / mL. A negative result does not preclude SARS-CoV-2 infection  and should not be used as the sole basis for treatment or other  patient management decisions.  A negative result may occur with  improper specimen collection / handling, submission of specimen other  than nasopharyngeal swab, presence of viral mutation(s) within the  areas targeted by this assay, and inadequate number of viral copies  (<250 copies / mL). A negative result must be combined with clinical  observations, patient history, and epidemiological information. If result is POSITIVE SARS-CoV-2 target nucleic acids are DETECTED. The SARS-CoV-2 RNA is generally detectable in upper and lower  respiratory specimens dur ing the acute phase of infection.  Positive  results are indicative of active infection with SARS-CoV-2.  Clinical  correlation with patient history and other diagnostic information is  necessary to determine patient infection status.  Positive results do  not rule out  bacterial infection or co-infection with other viruses. If result is PRESUMPTIVE POSTIVE SARS-CoV-2 nucleic acids MAY BE PRESENT.   A presumptive positive result was obtained on the submitted specimen  and confirmed on repeat testing.  While 2019 novel coronavirus  (SARS-CoV-2) nucleic acids may be present in the submitted sample  additional confirmatory testing may be necessary for epidemiological  and / or clinical management purposes  to differentiate between  SARS-CoV-2 and other Sarbecovirus currently known to infect humans.  If clinically indicated additional testing with an alternate test  methodology [phone]) is advised. The SARS-CoV-2 RNA is generally  detectable in upper and lower respiratory sp ecimens during the acute  phase of infection. The expected result is Negative. Fact Sheet for Patients:  StrictlyIdeas.no Fact Sheet for Healthcare Providers: BankingDealers.co.za This test is not yet approved or cleared by the [place] FDA and  has been authorized for detection and/or diagnosis of SARS-CoV-2 by FDA under an Emergency Use Authorization (EUA).  This EUA will remain in effect (meaning this test can be used) for the duration of the COVID-19 declaration under Section 564(b)(1) of the Act, 21 U.S.C. section 360bbb-3(b)(1), unless the authorization is terminated or revoked sooner. Performed at [facility], [place] [address], [place], [place] [address]          Radiology Studies: Ct Abdomen Pelvis Wo Contrast  Result Date: 09/21/2018 CLINICAL DATA:  Nausea and vomiting EXAM: CT ABDOMEN AND PELVIS WITHOUT CONTRAST TECHNIQUE: Multidetector CT imaging of the abdomen and pelvis was performed following the standard protocol without IV contrast. COMPARISON:  03/14/2015 FINDINGS: Lower chest:  No contributory findings. Hepatobiliary: No focal liver abnormality.No evidence of biliary obstruction or stone. Pancreas:  Unremarkable. Spleen: Unremarkable. Adrenals/Urinary Tract: Negative adrenals. No hydronephrosis or stone. Small renal cystic densities which were better seen on 2017 abdominal MRI. Mild scarring at the upper pole left kidney. Circumferential prominent thickness of the bladder, accentuated by under distention. Stomach/Bowel:  No obstruction. No appendicitis. Vascular/Lymphatic: No acute vascular abnormality. No mass or adenopathy. Reproductive:Symmetric thickening of the prostate. Other: No ascites or pneumoperitoneum. Small fatty umbilical hernia. Musculoskeletal: No acute abnormalities. IMPRESSION: 1. No acute finding. 2. Prominent bladder wall thickness but accentuated by under distention. Electronically Signed   By: Monte Fantasia M.D.   On: 09/21/2018 04:55   US Renal  Result Date: 09/21/2018 CLINICAL DATA:  Hypertensive urgency, acute kidney injury chronic kidney disease stage 3, sickle-cell disease EXAM: RENAL / URINARY TRACT ULTRASOUND COMPLETE COMPARISON:  09/21/2018 CT without contrast FINDINGS: Right Kidney: Renal measurements: 11.6 x 5.2 x 6.5 cm = volume: 202 mL. Increased cortical echogenicity compatible with chronic medical renal disease. No hydronephrosis. Scattered anechoic cortical renal cysts, largest in the midpole measuring 1.7 cm in greatest diameter. Left Kidney: Renal measurements: 11.0 x 4.5 x 4.1 cm = volume: 105 mL. Similar increased cortical echogenicity compatible with medical renal disease. No hydronephrosis. Scattered anechoic small renal cysts measuring 13 mm or less in size. Bladder: Marked diffuse bladder wall irregularity and thickening remains nonspecific by ultrasound. Prostate is enlarged measuring 5.8 x 5.1 x 5.6 cm IMPRESSION: increased renal echogenicity compatible with chronic medical renal disease. No hydronephrosis Incidental bilateral renal cysts Nonspecific diffuse bladder wall thickening Prostate enlargement Electronically Signed   By: Jerilynn Mages.  Shick M.D.   On:  09/21/2018 13:15   Dg Chest Port 1 View  Result Date: 09/21/2018 CLINICAL DATA:  48 year old male with pain. EXAM: PORTABLE CHEST 1 VIEW COMPARISON:  Chest radiographs 09/11/2016. FINDINGS: Portable AP semi upright view at 0009 hours. Lung volumes and mediastinal contours are normal. Visualized tracheal air column is within normal limits. Allowing for portable technique the lungs are clear. No acute osseous abnormality identified. Negative visible bowel gas pattern. IMPRESSION: Negative portable chest Electronically Signed   By: Genevie Ann M.D.   On: 09/21/2018 00:44   Vas US Renal Artery Duplex  Result Date: 09/21/2018 ABDOMINAL VISCERAL Indications: History of stage 3 CKD, hypertension with very high BP. Other Factors: CT angio of abdomen in 2016 revealed 3 LRAs with some thickening                and luminal narrowing in one of the arteries. RRA WNL. Limitations: Air/bowel gas. Performing Technologist: Oda Cogan RDMS, RVT  Examination Guidelines: A complete evaluation includes B-mode imaging, spectral Doppler, color Doppler, and power Doppler as needed of all accessible  portions of each vessel. Bilateral testing is considered an integral part of a complete examination. Limited examinations for reoccurring indications may be performed as noted.  Duplex Findings: +----------------------+--------+--------+------+--------+  Mesenteric             PSV cm/s EDV cm/s Plaque Comments  +----------------------+--------+--------+------+--------+  Aorta Prox               110                              +----------------------+--------+--------+------+--------+  Celiac Artery Proximal   217                              +----------------------+--------+--------+------+--------+  SMA Proximal             138                              +----------------------+--------+--------+------+--------+  +------------------+--------+--------+-------+  Right Renal Artery PSV cm/s EDV cm/s Comment   +------------------+--------+--------+-------+  Origin                61       18             +------------------+--------+--------+-------+  Proximal              60       18             +------------------+--------+--------+-------+  Mid                   51       19             +------------------+--------+--------+-------+  Distal                57       23             +------------------+--------+--------+-------+ +-----------------+--------+--------+-------+  Left Renal Artery PSV cm/s EDV cm/s Comment  +-----------------+--------+--------+-------+  Origin               82       22             +-----------------+--------+--------+-------+  Proximal             66       21             +-----------------+--------+--------+-------+  Mid                  79       21             +-----------------+--------+--------+-------+  Distal               52       12             +-----------------+--------+--------+-------+ +------------+--------+--------+----+-----------+--------+--------+----+  Right Kidney PSV cm/s EDV cm/s RI   Left Kidney PSV cm/s EDV cm/s RI    +------------+--------+--------+----+-----------+--------+--------+----+  Upper Pole   14       7        0.46 Upper Pole  17       9        0.48  +------------+--------+--------+----+-----------+--------+--------+----+  Mid          16       8  0.49 Mid         15       7        0.52  +------------+--------+--------+----+-----------+--------+--------+----+  Lower Pole   13       8        0.38 Lower Pole  11       7        0.38  +------------+--------+--------+----+-----------+--------+--------+----+  Hilar        23       8        0.65 Hilar       20       8        0.60  +------------+--------+--------+----+-----------+--------+--------+----+ +------------------+-----+------------------+-----+  Right Kidney             Left Kidney               +------------------+-----+------------------+-----+  RAR                      RAR                        +------------------+-----+------------------+-----+  RAR (manual)       0.61  RAR (manual)       0.74   +------------------+-----+------------------+-----+  Cortex                   Cortex                    +------------------+-----+------------------+-----+  Cortex thickness         Corex thickness           +------------------+-----+------------------+-----+  Kidney length (cm) 11.90 Kidney length (cm) 11.40  +------------------+-----+------------------+-----+  Summary: Renal:  Right: No evidence of right main renal artery stenosis. Increased        echogenicity of the right kidney. Left:  No evidence of left main renal artery stenosis. Increased        echogenicity of the left kidney.  *See table(s) above for measurements and observations.  Diagnosing physician: Curt Jews MD  Electronically signed by Curt Jews MD on 09/21/2018 at 4:29:58 PM.    Final         Scheduled Meds:  amLODipine  10 mg Oral Daily   cholecalciferol  1,000 Units Oral Daily   folic acid  1 mg Oral Daily   heparin  5,000 Units Subcutaneous Q8H   hydrALAZINE  100 mg Oral Q8H   labetalol  400 mg Oral BID   mometasone-formoterol  2 puff Inhalation BID   multivitamin with minerals  1 tablet Oral Daily   omega-3 acid ethyl esters  1 g Oral BID   thiamine  100 mg Oral Daily   Continuous Infusions:  nitroGLYCERIN Stopped (09/21/18 1324)     LOS: 0 days    Time spent: 32 minutes.     Hosie Poisson, MD [facility] Pager [phone]  If 7PM-7AM, please contact night-coverage [email or web address] Password [facility] 09/22/2018, 2:11 PM

## 2018-09-22 NOTE — Progress Notes (Signed)
[facility] Progress Note   Subjective:     Overnight: No acute events reported.  Today, Dylan Sweeney reports he is doing really well and has been tolerating diet without any complaints.  Objective Vitals:   09/21/18 2012 09/21/18 2141 09/22/18 0602 09/22/18 0603  BP: (!) 153/96  (!) 176/126   Pulse: 89 85 88   Resp: 17     Temp: 98.7 F (37.1 C)  98.3 F (36.8 C)   TempSrc: Oral  Oral   SpO2: 99%  98%   Weight:    81.7 kg  Height:       Physical Exam General: No apparent distress, lying comfortably in bed Heart: RRR, no murmurs, gallops, rubs Lungs: Clear to auscultation bilaterally, no wheezes, crackles, rhonchi Abdomen: Sounds present, nontender to palpation Extremities: No lower extremity edema Dialysis Access: None  Additional Objective Labs: Basic Metabolic Panel: Recent Labs  Lab 09/20/18 1930 09/21/18 0343  NA 134* 137  K 3.1* 3.1*  CL 98 98  CO2 24 23  GLUCOSE 129* 114*  BUN 30* 30*  CREATININE 4.76* 4.70*  CALCIUM 9.3 9.2   Liver Function Tests: Recent Labs  Lab 09/20/18 1930  AST 25  ALT 14  ALKPHOS 50  BILITOT 1.0  PROT 7.7  ALBUMIN 4.0   No results for input(s): LIPASE, AMYLASE in the last 168 hours. CBC: Recent Labs  Lab 09/20/18 1930 09/21/18 0343  WBC 13.6* 11.2*  HGB 13.5 12.8*  HCT 40.7 38.3*  MCV 85.7 86.7  PLT 289 253   Blood Culture No results found for: SDES, SPECREQUEST, CULT, REPTSTATUS  Cardiac Enzymes: Recent Labs  Lab 09/21/18 0124 09/21/18 0343 09/21/18 1449  TROPONINI 0.03* 0.03* 0.04*   CBG: No results for input(s): GLUCAP in the last 168 hours. Iron Studies: No results for input(s): IRON, TIBC, TRANSFERRIN, FERRITIN in the last 72 hours. @lablastinr3 @ Studies/Results: Ct Abdomen Pelvis Wo Contrast  Result Date: 09/21/2018 CLINICAL DATA:  Nausea and vomiting EXAM: CT ABDOMEN AND PELVIS WITHOUT CONTRAST TECHNIQUE: Multidetector CT imaging of the abdomen and pelvis was performed  following the standard protocol without IV contrast. COMPARISON:  03/14/2015 FINDINGS: Lower chest:  No contributory findings. Hepatobiliary: No focal liver abnormality.No evidence of biliary obstruction or stone. Pancreas: Unremarkable. Spleen: Unremarkable. Adrenals/Urinary Tract: Negative adrenals. No hydronephrosis or stone. Small renal cystic densities which were better seen on 2017 abdominal MRI. Mild scarring at the upper pole left kidney. Circumferential prominent thickness of the bladder, accentuated by under distention. Stomach/Bowel:  No obstruction. No appendicitis. Vascular/Lymphatic: No acute vascular abnormality. No mass or adenopathy. Reproductive:Symmetric thickening of the prostate. Other: No ascites or pneumoperitoneum. Small fatty umbilical hernia. Musculoskeletal: No acute abnormalities. IMPRESSION: 1. No acute finding. 2. Prominent bladder wall thickness but accentuated by under distention. Electronically Signed   By: Monte Fantasia M.D.   On: 09/21/2018 04:55   US Renal  Result Date: 09/21/2018 CLINICAL DATA:  Hypertensive urgency, acute kidney injury chronic kidney disease stage 3, sickle-cell disease EXAM: RENAL / URINARY TRACT ULTRASOUND COMPLETE COMPARISON:  09/21/2018 CT without contrast FINDINGS: Right Kidney: Renal measurements: 11.6 x 5.2 x 6.5 cm = volume: 202 mL. Increased cortical echogenicity compatible with chronic medical renal disease. No hydronephrosis. Scattered anechoic cortical renal cysts, largest in the midpole measuring 1.7 cm in greatest diameter. Left Kidney: Renal measurements: 11.0 x 4.5 x 4.1 cm = volume: 105 mL. Similar increased cortical echogenicity compatible with medical renal disease. No hydronephrosis. Scattered anechoic small renal cysts measuring 13 mm  or less in size. Bladder: Marked diffuse bladder wall irregularity and thickening remains nonspecific by ultrasound. Prostate is enlarged measuring 5.8 x 5.1 x 5.6 cm IMPRESSION: increased renal  echogenicity compatible with chronic medical renal disease. No hydronephrosis Incidental bilateral renal cysts Nonspecific diffuse bladder wall thickening Prostate enlargement Electronically Signed   By: Jerilynn Mages.  Shick M.D.   On: 09/21/2018 13:15   Dg Chest Port 1 View  Result Date: 09/21/2018 CLINICAL DATA:  48 year old male with pain. EXAM: PORTABLE CHEST 1 VIEW COMPARISON:  Chest radiographs 09/11/2016. FINDINGS: Portable AP semi upright view at 0009 hours. Lung volumes and mediastinal contours are normal. Visualized tracheal air column is within normal limits. Allowing for portable technique the lungs are clear. No acute osseous abnormality identified. Negative visible bowel gas pattern. IMPRESSION: Negative portable chest Electronically Signed   By: Genevie Ann M.D.   On: 09/21/2018 00:44   Vas US Renal Artery Duplex  Result Date: 09/21/2018 ABDOMINAL VISCERAL Indications: History of stage 3 CKD, hypertension with very high BP. Other Factors: CT angio of abdomen in 2016 revealed 3 LRAs with some thickening                and luminal narrowing in one of the arteries. RRA WNL. Limitations: Air/bowel gas. Performing Technologist: Oda Cogan RDMS, RVT  Examination Guidelines: A complete evaluation includes B-mode imaging, spectral Doppler, color Doppler, and power Doppler as needed of all accessible portions of each vessel. Bilateral testing is considered an integral part of a complete examination. Limited examinations for reoccurring indications may be performed as noted.  Duplex Findings: +----------------------+--------+--------+------+--------+ Mesenteric            PSV cm/sEDV cm/sPlaqueComments +----------------------+--------+--------+------+--------+ Aorta Prox              110                          +----------------------+--------+--------+------+--------+ Celiac Artery Proximal  217                          +----------------------+--------+--------+------+--------+ SMA Proximal             138                          +----------------------+--------+--------+------+--------+  +------------------+--------+--------+-------+ Right Renal ArteryPSV cm/sEDV cm/sComment +------------------+--------+--------+-------+ Origin               61      18           +------------------+--------+--------+-------+ Proximal             60      18           +------------------+--------+--------+-------+ Mid                  51      19           +------------------+--------+--------+-------+ Distal               57      23           +------------------+--------+--------+-------+ +-----------------+--------+--------+-------+ Left Renal ArteryPSV cm/sEDV cm/sComment +-----------------+--------+--------+-------+ Origin              82      22           +-----------------+--------+--------+-------+ Proximal            66  21           +-----------------+--------+--------+-------+ Mid                 79      21           +-----------------+--------+--------+-------+ Distal              52      12           +-----------------+--------+--------+-------+ +------------+--------+--------+----+-----------+--------+--------+----+ Right KidneyPSV cm/sEDV cm/sRI  Left KidneyPSV cm/sEDV cm/sRI   +------------+--------+--------+----+-----------+--------+--------+----+ Upper Pole  14      7       0.46Upper Pole 17      9       0.48 +------------+--------+--------+----+-----------+--------+--------+----+ Mid         16      8       0.49Mid        15      7       0.52 +------------+--------+--------+----+-----------+--------+--------+----+ Lower Pole  13      8       0.38Lower Pole 11      7       0.38 +------------+--------+--------+----+-----------+--------+--------+----+ Hilar       23      8       0.65Hilar      20      8       0.60 +------------+--------+--------+----+-----------+--------+--------+----+  +------------------+-----+------------------+-----+ Right Kidney           Left Kidney             +------------------+-----+------------------+-----+ RAR                    RAR                     +------------------+-----+------------------+-----+ RAR (manual)      0.61 RAR (manual)      0.74  +------------------+-----+------------------+-----+ Cortex                 Cortex                  +------------------+-----+------------------+-----+ Cortex thickness       Corex thickness         +------------------+-----+------------------+-----+ Kidney length (cm)11.90Kidney length (cm)11.40 +------------------+-----+------------------+-----+  Summary: Renal:  Right: No evidence of right main renal artery stenosis. Increased        echogenicity of the right kidney. Left:  No evidence of left main renal artery stenosis. Increased        echogenicity of the left kidney.  *See table(s) above for measurements and observations.  Diagnosing physician: Curt Jews MD  Electronically signed by Curt Jews MD on 09/21/2018 at 4:29:58 PM.    Final    Medications: . nitroGLYCERIN Stopped (09/21/18 1324)   . amLODipine  10 mg Oral Daily  . cholecalciferol  1,000 Units Oral Daily  . folic acid  1 mg Oral Daily  . heparin  5,000 Units Subcutaneous Q8H  . hydrALAZINE  25 mg Oral Q8H  . labetalol  400 mg Oral BID  . mometasone-formoterol  2 puff Inhalation BID  . multivitamin with minerals  1 tablet Oral Daily  . omega-3 acid ethyl esters  1 g Oral BID  . potassium chloride  40 mEq Oral Once  . thiamine  100 mg Oral Daily      Assessment/Plan:  Dylan Sweeney is a 48 year old pleasant African-American gentleman with hypertension, CKD stage III, renal artery  dissection (2016) and asthma for management of hypertension also found to have an acute on chronic renal injury.  1.  Hypertensive emergency: BP still with range 150s-170s/90s-120s despite slowly resuming home antihypertensives. Remains  asymptomatic. Renal artery U/S unremarkable. Echocardiogram also unremarkable.  -Continue Labetalol 400mg  BID -Amlodipine 10mg  daily  -Start Hydralazine 25mg  TID (takes 100mg  mg TID at home) -Continue to monitor -Continue Cardiac monitoring  . 2.  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 Good UOP of 1.3L. Urinalysis reveal microscopic hematuria, proteinuria without RBC cast 2/2 sequale of HRN emergency. UA also shows rare bacteria. Renal artery U/S unremarkable. FeNa 0.9%.  -Avoid nephrotoxins, NSAIDs, ACE inhibitors, ARB's, potassium sparing diuretics, IV contrast -Follow-up daily BMP -Strict intake and output -Advice oral rehydration  3.  Hypokalemia:  Resolved  4.  Alcohol use disorder: Continue CIWA protocol with as needed Ativan, thiamine, folate and multivitamin

## 2018-09-23 LAB — RENAL FUNCTION PANEL
Albumin: 3.5 g/dL (ref 3.5–5.0)
Anion gap: 11 (ref 5–15)
BUN: 27 mg/dL — ABNORMAL HIGH (ref 6–20)
CO2: 23 mmol/L (ref 22–32)
Calcium: 8.9 mg/dL (ref 8.9–10.3)
Chloride: 102 mmol/L (ref 98–111)
Creatinine, Ser: 3.54 mg/dL — ABNORMAL HIGH (ref 0.61–1.24)
GFR calc Af Amer: 22 mL/min — ABNORMAL LOW (ref >60)
GFR calc non Af Amer: 19 mL/min — ABNORMAL LOW (ref >60)
Glucose, Bld: 108 mg/dL — ABNORMAL HIGH (ref 70–99)
Phosphorus: 2.7 mg/dL (ref 2.5–4.6)
Potassium: 3.6 mmol/L (ref 3.5–5.1)
Sodium: 136 mmol/L (ref 135–145)

## 2018-09-23 LAB — CBC
HCT: 38 % — ABNORMAL LOW (ref 39.0–52.0)
Hemoglobin: 12.8 g/dL — ABNORMAL LOW (ref 13.0–17.0)
MCH: 28.6 pg (ref 26.0–34.0)
MCHC: 33.7 g/dL (ref 30.0–36.0)
MCV: 84.8 fL (ref 80.0–100.0)
Platelets: 262 10*3/uL (ref 150–400)
RBC: 4.48 MIL/uL (ref 4.22–5.81)
RDW: 11.9 % (ref 11.5–15.5)
WBC: 8.6 10*3/uL (ref 4.0–10.5)
nRBC: 0 % (ref 0.0–0.2)

## 2018-09-23 MED ORDER — LABETALOL HCL 200 MG PO TABS: 400.0000 mg | ORAL_TABLET | Freq: Three times a day (TID) | ORAL | 1 refills | Status: DC

## 2018-09-23 MED ORDER — AMLODIPINE BESYLATE 10 MG PO TABS
10.0000 mg | ORAL_TABLET | Freq: Every day | ORAL | Status: DC
  Administered 2018-09-23: 10 mg via ORAL
  Filled 2018-09-23: qty 1

## 2018-09-23 MED ORDER — LABETALOL HCL 200 MG PO TABS
400.0000 mg | ORAL_TABLET | Freq: Three times a day (TID) | ORAL | Status: DC
  Administered 2018-09-23: 400 mg via ORAL
  Filled 2018-09-23: qty 2

## 2018-09-23 MED ORDER — TAMSULOSIN HCL 0.4 MG PO CAPS: 0.4000 mg | ORAL_CAPSULE | Freq: Every day | ORAL | 0 refills | Status: DC

## 2018-09-23 MED ORDER — ADULT MULTIVITAMIN W/MINERALS CH: 1.0000 | ORAL_TABLET | Freq: Every day | ORAL | Status: DC

## 2018-09-23 MED ORDER — TAMSULOSIN HCL 0.4 MG PO CAPS
0.4000 mg | ORAL_CAPSULE | Freq: Every day | ORAL | Status: DC
  Administered 2018-09-23: 09:00:00 0.4 mg via ORAL
  Filled 2018-09-23: qty 1

## 2018-09-23 NOTE — Progress Notes (Addendum)
[facility] Progress Note   Subjective:     Overnight: No significant acute overnight events.  Today, Mr. Dylan Sweeney was examined at bedside and reports he is doing well and requested if his tamsulosin can be started due to medical history of BPH.  He is maintaining good oral intake.  Objective Vitals:   09/22/18 2042 09/22/18 2121 09/22/18 2343 09/23/18 0404  BP:   (!) 155/110 (!) 163/118  Pulse: 85 88 98 81  Resp: 17     Temp:    97.9 F (36.6 C)  TempSrc:    Oral  SpO2:    97%  Weight:    82.7 kg  Height:       Physical Exam General: No apparent distress, lying comfortably in bed Heart: RRR, no murmurs, gallops, rubs Lungs: Clear to auscultation bilaterally, no wheezes, crackles, rhonchi Abdomen: Sounds present, nontender to palpation Extremities: No lower extremity edema Dialysis Access: None  Additional Objective Labs: Basic Metabolic Panel: Recent Labs  Lab 09/20/18 1930 09/21/18 0343 09/22/18 0654  NA 134* 137 137  K 3.1* 3.1* 3.7  CL 98 98 102  CO2 24 23 23   GLUCOSE 129* 114* 106*  BUN 30* 30* 29*  CREATININE 4.76* 4.70* 3.88*  CALCIUM 9.3 9.2 9.1  PHOS  --   --  3.4   Liver Function Tests: Recent Labs  Lab 09/20/18 1930 09/22/18 0654  AST 25  --   ALT 14  --   ALKPHOS 50  --   BILITOT 1.0  --   PROT 7.7  --   ALBUMIN 4.0 3.5   No results for input(s): LIPASE, AMYLASE in the last 168 hours. CBC: Recent Labs  Lab 09/20/18 1930 09/21/18 0343 09/22/18 0654  WBC 13.6* 11.2* 9.5  HGB 13.5 12.8* 13.0  HCT 40.7 38.3* 38.9*  MCV 85.7 86.7 86.3  PLT 289 253 260   Blood Culture No results found for: SDES, SPECREQUEST, CULT, REPTSTATUS  Cardiac Enzymes: Recent Labs  Lab 09/21/18 0124 09/21/18 0343 09/21/18 1449  TROPONINI 0.03* 0.03* 0.04*    Studies/Results: US Renal  Result Date: 09/21/2018 CLINICAL DATA:  Hypertensive urgency, acute kidney injury chronic kidney disease stage 3, sickle-cell disease EXAM: RENAL /  URINARY TRACT ULTRASOUND COMPLETE COMPARISON:  09/21/2018 CT without contrast FINDINGS: Right Kidney: Renal measurements: 11.6 x 5.2 x 6.5 cm = volume: 202 mL. Increased cortical echogenicity compatible with chronic medical renal disease. No hydronephrosis. Scattered anechoic cortical renal cysts, largest in the midpole measuring 1.7 cm in greatest diameter. Left Kidney: Renal measurements: 11.0 x 4.5 x 4.1 cm = volume: 105 mL. Similar increased cortical echogenicity compatible with medical renal disease. No hydronephrosis. Scattered anechoic small renal cysts measuring 13 mm or less in size. Bladder: Marked diffuse bladder wall irregularity and thickening remains nonspecific by ultrasound. Prostate is enlarged measuring 5.8 x 5.1 x 5.6 cm IMPRESSION: increased renal echogenicity compatible with chronic medical renal disease. No hydronephrosis Incidental bilateral renal cysts Nonspecific diffuse bladder wall thickening Prostate enlargement Electronically Signed   By: Jerilynn Mages.  Shick M.D.   On: 09/21/2018 13:15   Vas US Renal Artery Duplex  Result Date: 09/21/2018 ABDOMINAL VISCERAL Indications: History of stage 3 CKD, hypertension with very high BP. Other Factors: CT angio of abdomen in 2016 revealed 3 LRAs with some thickening                and luminal narrowing in one of the arteries. RRA WNL. Limitations: Air/bowel gas. Performing Technologist: Oda Cogan RDMS,  RVT  Examination Guidelines: A complete evaluation includes B-mode imaging, spectral Doppler, color Doppler, and power Doppler as needed of all accessible portions of each vessel. Bilateral testing is considered an integral part of a complete examination. Limited examinations for reoccurring indications may be performed as noted.  Duplex Findings: +----------------------+--------+--------+------+--------+ Mesenteric            PSV cm/sEDV cm/sPlaqueComments +----------------------+--------+--------+------+--------+ Aorta Prox              110                           +----------------------+--------+--------+------+--------+ Celiac Artery Proximal  217                          +----------------------+--------+--------+------+--------+ SMA Proximal            138                          +----------------------+--------+--------+------+--------+  +------------------+--------+--------+-------+ Right Renal ArteryPSV cm/sEDV cm/sComment +------------------+--------+--------+-------+ Origin               61      18           +------------------+--------+--------+-------+ Proximal             60      18           +------------------+--------+--------+-------+ Mid                  51      19           +------------------+--------+--------+-------+ Distal               57      23           +------------------+--------+--------+-------+ +-----------------+--------+--------+-------+ Left Renal ArteryPSV cm/sEDV cm/sComment +-----------------+--------+--------+-------+ Origin              82      22           +-----------------+--------+--------+-------+ Proximal            66      21           +-----------------+--------+--------+-------+ Mid                 79      21           +-----------------+--------+--------+-------+ Distal              52      12           +-----------------+--------+--------+-------+ +------------+--------+--------+----+-----------+--------+--------+----+ Right KidneyPSV cm/sEDV cm/sRI  Left KidneyPSV cm/sEDV cm/sRI   +------------+--------+--------+----+-----------+--------+--------+----+ Upper Pole  14      7       0.46Upper Pole 17      9       0.48 +------------+--------+--------+----+-----------+--------+--------+----+ Mid         16      8       0.49Mid        15      7       0.52 +------------+--------+--------+----+-----------+--------+--------+----+ Lower Pole  13      8       0.38Lower Pole 11      7       0.38  +------------+--------+--------+----+-----------+--------+--------+----+ Hilar       23      8  0.65Hilar      20      8       0.60 +------------+--------+--------+----+-----------+--------+--------+----+ +------------------+-----+------------------+-----+ Right Kidney           Left Kidney             +------------------+-----+------------------+-----+ RAR                    RAR                     +------------------+-----+------------------+-----+ RAR (manual)      0.61 RAR (manual)      0.74  +------------------+-----+------------------+-----+ Cortex                 Cortex                  +------------------+-----+------------------+-----+ Cortex thickness       Corex thickness         +------------------+-----+------------------+-----+ Kidney length (cm)11.90Kidney length (cm)11.40 +------------------+-----+------------------+-----+  Summary: Renal:  Right: No evidence of right main renal artery stenosis. Increased        echogenicity of the right kidney. Left:  No evidence of left main renal artery stenosis. Increased        echogenicity of the left kidney.  *See table(s) above for measurements and observations.  Diagnosing physician: Curt Jews MD  Electronically signed by Curt Jews MD on 09/21/2018 at 4:29:58 PM.    Final    Medications: . nitroGLYCERIN Stopped (09/21/18 1324)   . amLODipine  10 mg Oral Daily  . cholecalciferol  1,000 Units Oral Daily  . folic acid  1 mg Oral Daily  . heparin  5,000 Units Subcutaneous Q8H  . hydrALAZINE  100 mg Oral Q8H  . labetalol  400 mg Oral BID  . mometasone-formoterol  2 puff Inhalation BID  . multivitamin with minerals  1 tablet Oral Daily  . omega-3 acid ethyl esters  1 g Oral BID  . polyethylene glycol  17 g Oral Daily  . thiamine  100 mg Oral Daily    Dialysis Days:  Assessment/Plan:  Dylan Sweeney is a 48 year old pleasant African-American gentleman with hypertension, CKD stage III, renal artery  dissection (2016) and asthma for management of hypertension also found to have an acute on chronic renal injury.  1. Hypertensive emergency:  Stable with BP in the 150s/110s.  He had not received amlodipine or tamsulosin this morning.  -Continue Labetalol 400mg  BID -Amlodipine 10mg  daily  -Continue hydralazine 100mg  TID  -Continue to monitor -Continue Cardiac monitoring  . 2. 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  FeNa 0.7%.  Serum creatinine improving 3.5<<3.8.  His worsening renal function with a combination of severe dehydration and medical renal disease. -Avoid nephrotoxins, NSAIDs, ACE inhibitors, ARB's, potassium sparing diuretics, IV contrast -Follow-up daily BMP -Advice oral rehydration -Follow-up outpatient nephrology at [facility] hospital  3. Hypokalemia: Resolved  4. Alcohol use disorder: Continue CIWA protocol with as needed Ativan, thiamine, folate and multivitamin  5.  Benign prostate hyperplasia: Continue tamsulosin 0.4 mg daily.

## 2018-09-23 NOTE — Discharge Summary (Signed)
Physician Discharge Summary  MONTAE Sweeney KKX:[id number] DOB: 1970/11/29 DOA: 09/20/2018  PCP: [facility]  Admit date: 09/20/2018 Discharge date: 09/23/2018  Admitted From: Home.  Disposition:  Home.   Recommendations for Outpatient Follow-up:  1. Follow up with PCP in 1-2 weeks 2. Please obtain BMP/CBC in one week Please follow up with nephrology as recommended.     Discharge Condition:stable.  CODE STATUS:full code.  Diet recommendation: Heart Healthy   Brief/Interim Summary: Dylan Monjaraz Milleris a 48 y.o.malewith medical history significant ofCKD 3, hypertension, asthma presenting to the hospitalwith a chief complaint of high blood pressure. Patient states for the past few days his blood pressure has been very high and he is been having intermittent headaches and squeezing left-sided chest pain. He was admitted for hypertensive urgency.   Discharge Diagnoses:  Principal Problem:   Hypertensive emergency Active Problems:   AKI (acute kidney injury) ([place])   CKD (chronic kidney disease) stage 3, GFR 30-59 ml/min ([facility])   Vomiting   Hypokalemia  Hypertensive emergency:  Resolved.  Transitioned off nitro gtt.  Increased the hydralazine 100 mg TID , LABETAOLOL 400 MG TID, amlodipine 10 mg daily. BP much improved.  He was recommended to follow up with nephrology as recommended and PCP inone week.   AKI on stage 3 CKD.  Improvement in the creatinine to 3.5. Appreciate nephrology recommendations.  Reviewed renal artery duplex.    Elevated troponin; from demand ischemia from hypertensive crisis.       Discharge Instructions  Discharge Instructions    Diet - low sodium heart healthy   Complete by:  As directed    Discharge instructions   Complete by:  As directed    Follow up with PCP in one week.  Follow up with nephrology as recommended.     Allergies as of 09/23/2018      Reactions   Lisinopril Other (See Comments)   Couldn't stop coughing    Other Other (See Comments)   G6 pill allergy: pill given before travel in [place]      Medication List    STOP taking these medications   spironolactone 50 MG tablet Commonly known as:  ALDACTONE     TAKE these medications   albuterol 108 (90 Base) MCG/ACT inhaler Commonly known as:  VENTOLIN HFA Inhale into the lungs every 6 (six) hours as needed for wheezing or shortness of breath.   amLODipine 10 MG tablet Commonly known as:  NORVASC Take 1 tablet (10 mg total) by mouth daily.   cholecalciferol 1000 units tablet Commonly known as:  VITAMIN D Take 1,000 Units by mouth daily.   Fluticasone-Salmeterol 250-50 MCG/DOSE Aepb Commonly known as:  ADVAIR Inhale 1 puff into the lungs 2 (two) times daily.   folic acid 1 MG tablet Commonly known as:  FOLVITE Take 1 tablet (1 mg total) by mouth daily.   hydrALAZINE 100 MG tablet Commonly known as:  APRESOLINE Take 1 tablet (100 mg total) by mouth every 8 (eight) hours.   labetalol 200 MG tablet Commonly known as:  NORMODYNE Take 2 tablets (400 mg total) by mouth 3 (three) times daily.   multivitamin with minerals Tabs tablet Take 1 tablet by mouth daily. Start taking on:  Sep 24, 2018   omega-3 acid ethyl esters 1 g capsule Commonly known as:  LOVAZA Take by mouth 2 (two) times daily.   tamsulosin 0.4 MG Caps capsule Commonly known as:  FLOMAX Take 1 capsule (0.4 mg total) by mouth daily  after breakfast. Start taking on:  Sep 24, 2018   thiamine 100 MG tablet Take 1 tablet (100 mg total) by mouth daily.      Follow-up [facility]. Schedule an appointment as soon as possible for a visit in 1 week(s).   Specialty:  General Practice Contact information: [place] [address] [phone]          Allergies  Allergen Reactions  . Lisinopril Other (See Comments)    Couldn't stop coughing  . Other Other (See Comments)    G6 pill allergy: pill given before travel in  [place]    Consultations:  Nephrology.    Procedures/Studies: Ct Abdomen Pelvis Wo Contrast  Result Date: 09/21/2018 CLINICAL DATA:  Nausea and vomiting EXAM: CT ABDOMEN AND PELVIS WITHOUT CONTRAST TECHNIQUE: Multidetector CT imaging of the abdomen and pelvis was performed following the standard protocol without IV contrast. COMPARISON:  03/14/2015 FINDINGS: Lower chest:  No contributory findings. Hepatobiliary: No focal liver abnormality.No evidence of biliary obstruction or stone. Pancreas: Unremarkable. Spleen: Unremarkable. Adrenals/Urinary Tract: Negative adrenals. No hydronephrosis or stone. Small renal cystic densities which were better seen on 2017 abdominal MRI. Mild scarring at the upper pole left kidney. Circumferential prominent thickness of the bladder, accentuated by under distention. Stomach/Bowel:  No obstruction. No appendicitis. Vascular/Lymphatic: No acute vascular abnormality. No mass or adenopathy. Reproductive:Symmetric thickening of the prostate. Other: No ascites or pneumoperitoneum. Small fatty umbilical hernia. Musculoskeletal: No acute abnormalities. IMPRESSION: 1. No acute finding. 2. Prominent bladder wall thickness but accentuated by under distention. Electronically Signed   By: Monte Fantasia M.D.   On: 09/21/2018 04:55   US Renal  Result Date: 09/21/2018 CLINICAL DATA:  Hypertensive urgency, acute kidney injury chronic kidney disease stage 3, sickle-cell disease EXAM: RENAL / URINARY TRACT ULTRASOUND COMPLETE COMPARISON:  09/21/2018 CT without contrast FINDINGS: Right Kidney: Renal measurements: 11.6 x 5.2 x 6.5 cm = volume: 202 mL. Increased cortical echogenicity compatible with chronic medical renal disease. No hydronephrosis. Scattered anechoic cortical renal cysts, largest in the midpole measuring 1.7 cm in greatest diameter. Left Kidney: Renal measurements: 11.0 x 4.5 x 4.1 cm = volume: 105 mL. Similar increased cortical echogenicity compatible with medical renal  disease. No hydronephrosis. Scattered anechoic small renal cysts measuring 13 mm or less in size. Bladder: Marked diffuse bladder wall irregularity and thickening remains nonspecific by ultrasound. Prostate is enlarged measuring 5.8 x 5.1 x 5.6 cm IMPRESSION: increased renal echogenicity compatible with chronic medical renal disease. No hydronephrosis Incidental bilateral renal cysts Nonspecific diffuse bladder wall thickening Prostate enlargement Electronically Signed   By: Jerilynn Mages.  Shick M.D.   On: 09/21/2018 13:15   Dg Chest Port 1 View  Result Date: 09/21/2018 CLINICAL DATA:  48 year old male with pain. EXAM: PORTABLE CHEST 1 VIEW COMPARISON:  Chest radiographs 09/11/2016. FINDINGS: Portable AP semi upright view at 0009 hours. Lung volumes and mediastinal contours are normal. Visualized tracheal air column is within normal limits. Allowing for portable technique the lungs are clear. No acute osseous abnormality identified. Negative visible bowel gas pattern. IMPRESSION: Negative portable chest Electronically Signed   By: Genevie Ann M.D.   On: 09/21/2018 00:44   Vas US Renal Artery Duplex  Result Date: 09/21/2018 ABDOMINAL VISCERAL Indications: History of stage 3 CKD, hypertension with very high BP. Other Factors: CT angio of abdomen in 2016 revealed 3 LRAs with some thickening                and  luminal narrowing in one of the arteries. RRA WNL. Limitations: Air/bowel gas. Performing Technologist: Oda Cogan RDMS, RVT  Examination Guidelines: A complete evaluation includes B-mode imaging, spectral Doppler, color Doppler, and power Doppler as needed of all accessible portions of each vessel. Bilateral testing is considered an integral part of a complete examination. Limited examinations for reoccurring indications may be performed as noted.  Duplex Findings: +----------------------+--------+--------+------+--------+ Mesenteric            PSV cm/sEDV cm/sPlaqueComments  +----------------------+--------+--------+------+--------+ Aorta Prox              110                          +----------------------+--------+--------+------+--------+ Celiac Artery Proximal  217                          +----------------------+--------+--------+------+--------+ SMA Proximal            138                          +----------------------+--------+--------+------+--------+  +------------------+--------+--------+-------+ Right Renal ArteryPSV cm/sEDV cm/sComment +------------------+--------+--------+-------+ Origin               61      18           +------------------+--------+--------+-------+ Proximal             60      18           +------------------+--------+--------+-------+ Mid                  51      19           +------------------+--------+--------+-------+ Distal               57      23           +------------------+--------+--------+-------+ +-----------------+--------+--------+-------+ Left Renal ArteryPSV cm/sEDV cm/sComment +-----------------+--------+--------+-------+ Origin              82      22           +-----------------+--------+--------+-------+ Proximal            66      21           +-----------------+--------+--------+-------+ Mid                 79      21           +-----------------+--------+--------+-------+ Distal              52      12           +-----------------+--------+--------+-------+ +------------+--------+--------+----+-----------+--------+--------+----+ Right KidneyPSV cm/sEDV cm/sRI  Left KidneyPSV cm/sEDV cm/sRI   +------------+--------+--------+----+-----------+--------+--------+----+ Upper Pole  14      7       0.46Upper Pole 17      9       0.48 +------------+--------+--------+----+-----------+--------+--------+----+ Mid         16      8       0.49Mid        15      7       0.52 +------------+--------+--------+----+-----------+--------+--------+----+  Lower Pole  13      8       0.38Lower Pole 11      7       0.38 +------------+--------+--------+----+-----------+--------+--------+----+  Hilar       23      8       0.65Hilar      20      8       0.60 +------------+--------+--------+----+-----------+--------+--------+----+ +------------------+-----+------------------+-----+ Right Kidney           Left Kidney             +------------------+-----+------------------+-----+ RAR                    RAR                     +------------------+-----+------------------+-----+ RAR (manual)      0.61 RAR (manual)      0.74  +------------------+-----+------------------+-----+ Cortex                 Cortex                  +------------------+-----+------------------+-----+ Cortex thickness       Corex thickness         +------------------+-----+------------------+-----+ Kidney length (cm)11.90Kidney length (cm)11.40 +------------------+-----+------------------+-----+  Summary: Renal:  Right: No evidence of right main renal artery stenosis. Increased        echogenicity of the right kidney. Left:  No evidence of left main renal artery stenosis. Increased        echogenicity of the left kidney.  *See table(s) above for measurements and observations.  Diagnosing physician: Curt Jews MD  Electronically signed by Curt Jews MD on 09/21/2018 at 4:29:58 PM.    Final        Subjective: No chest pain or sob, or headache.   Discharge Exam: Vitals:   09/23/18 1523 09/23/18 1526  BP: (!) 152/96 (!) 138/106  Pulse:    Resp:    Temp:    SpO2:     Vitals:   09/23/18 1358 09/23/18 1426 09/23/18 1523 09/23/18 1526  BP: (!) 172/123 (!) 172/123 (!) 152/96 (!) 138/106  Pulse:  93    Resp:      Temp:      TempSrc:      SpO2:      Weight:      Height:        General: Pt is alert, awake, not in acute distress Cardiovascular: RRR, S1/S2 +, no rubs, no gallops Respiratory: CTA bilaterally, no wheezing, no  rhonchi Abdominal: Soft, NT, ND, bowel sounds + Extremities: no edema, no cyanosis    The results of significant diagnostics from this hospitalization (including imaging, microbiology, ancillary and laboratory) are listed below for reference.     Microbiology: Recent Results (from the past 240 hour(s))  SARS Coronavirus 2 (CEPHEID - Performed in [place] hospital lab), Hosp Order     Status: None   Collection Time: 09/21/18 12:40 AM  Result Value Ref Range Status   SARS Coronavirus 2 NEGATIVE NEGATIVE Final    Comment: (NOTE) If result is NEGATIVE SARS-CoV-2 target nucleic acids are NOT DETECTED. The SARS-CoV-2 RNA is generally detectable in upper and lower  respiratory specimens during the acute phase of infection. The lowest  concentration of SARS-CoV-2 viral copies this assay can detect is 250  copies / mL. A negative result does not preclude SARS-CoV-2 infection  and should not be used as the sole basis for treatment or other  patient management decisions.  A negative result may occur with  improper specimen collection / handling, submission of specimen other  than nasopharyngeal swab, presence of viral  mutation(s) within the  areas targeted by this assay, and inadequate number of viral copies  (<250 copies / mL). A negative result must be combined with clinical  observations, patient history, and epidemiological information. If result is POSITIVE SARS-CoV-2 target nucleic acids are DETECTED. The SARS-CoV-2 RNA is generally detectable in upper and lower  respiratory specimens dur ing the acute phase of infection.  Positive  results are indicative of active infection with SARS-CoV-2.  Clinical  correlation with patient history and other diagnostic information is  necessary to determine patient infection status.  Positive results do  not rule out bacterial infection or co-infection with other viruses. If result is PRESUMPTIVE POSTIVE SARS-CoV-2 nucleic acids MAY BE  PRESENT.   A presumptive positive result was obtained on the submitted specimen  and confirmed on repeat testing.  While 2019 novel coronavirus  (SARS-CoV-2) nucleic acids may be present in the submitted sample  additional confirmatory testing may be necessary for epidemiological  and / or clinical management purposes  to differentiate between  SARS-CoV-2 and other Sarbecovirus currently known to infect humans.  If clinically indicated additional testing with an alternate test  methodology [phone]) is advised. The SARS-CoV-2 RNA is generally  detectable in upper and lower respiratory sp ecimens during the acute  phase of infection. The expected result is Negative. Fact Sheet for Patients:  StrictlyIdeas.no Fact Sheet for Healthcare Providers: BankingDealers.co.za This test is not yet approved or cleared by the [place] FDA and has been authorized for detection and/or diagnosis of SARS-CoV-2 by FDA under an Emergency Use Authorization (EUA).  This EUA will remain in effect (meaning this test can be used) for the duration of the COVID-19 declaration under Section 564(b)(1) of the Act, 21 U.S.C. section 360bbb-3(b)(1), unless the authorization is terminated or revoked sooner. Performed at [facility], [place] [address] [place], [place] [address]      Labs: BNP (last 3 results) No results for input(s): BNP in the last 8760 hours. Basic Metabolic Panel: Recent Labs  Lab 09/20/18 1930 09/21/18 0343 09/22/18 0654 09/23/18 0658  NA 134* 137 137 136  K 3.1* 3.1* 3.7 3.6  CL 98 98 102 102  CO2 24 23 23 23   GLUCOSE 129* 114* 106* 108*  BUN 30* 30* 29* 27*  CREATININE 4.76* 4.70* 3.88* 3.54*  CALCIUM 9.3 9.2 9.1 8.9  MG  --  1.9  --   --   PHOS  --   --  3.4 2.7   Liver Function Tests: Recent Labs  Lab 09/20/18 1930 09/22/18 0654 09/23/18 0658  AST 25  --   --   ALT 14  --   --   ALKPHOS 50  --   --   BILITOT 1.0   --   --   PROT 7.7  --   --   ALBUMIN 4.0 3.5 3.5   No results for input(s): LIPASE, AMYLASE in the last 168 hours. No results for input(s): AMMONIA in the last 168 hours. CBC: Recent Labs  Lab 09/20/18 1930 09/21/18 0343 09/22/18 0654 09/23/18 0658  WBC 13.6* 11.2* 9.5 8.6  HGB 13.5 12.8* 13.0 12.8*  HCT 40.7 38.3* 38.9* 38.0*  MCV 85.7 86.7 86.3 84.8  PLT 289 253 260 262   Cardiac Enzymes: Recent Labs  Lab 09/21/18 0124 09/21/18 0343 09/21/18 1449  TROPONINI 0.03* 0.03* 0.04*   BNP: Invalid input(s): POCBNP CBG: No results for input(s): GLUCAP in the last 168 hours. D-Dimer No results for input(s): DDIMER  in the last 72 hours. Hgb A1c No results for input(s): HGBA1C in the last 72 hours. Lipid Profile No results for input(s): CHOL, HDL, LDLCALC, TRIG, CHOLHDL, LDLDIRECT in the last 72 hours. Thyroid function studies No results for input(s): TSH, T4TOTAL, T3FREE, THYROIDAB in the last 72 hours.  Invalid input(s): FREET3 Anemia work up No results for input(s): VITAMINB12, FOLATE, FERRITIN, TIBC, IRON, RETICCTPCT in the last 72 hours. Urinalysis    Component Value Date/Time   COLORURINE YELLOW 09/21/2018 1112   APPEARANCEUR CLEAR 09/21/2018 1112   LABSPEC 1.011 09/21/2018 1112   PHURINE 5.0 09/21/2018 1112   GLUCOSEU NEGATIVE 09/21/2018 1112   HGBUR SMALL (A) 09/21/2018 1112   BILIRUBINUR NEGATIVE 09/21/2018 1112   KETONESUR NEGATIVE 09/21/2018 1112   PROTEINUR 100 (A) 09/21/2018 1112   UROBILINOGEN 1.0 03/15/2015 1034   NITRITE NEGATIVE 09/21/2018 1112   LEUKOCYTESUR NEGATIVE 09/21/2018 1112   Sepsis Labs Invalid input(s): PROCALCITONIN,  WBC,  LACTICIDVEN Microbiology Recent Results (from the past 240 hour(s))  SARS Coronavirus 2 (CEPHEID - Performed in [place] hospital lab), Hosp Order     Status: None   Collection Time: 09/21/18 12:40 AM  Result Value Ref Range Status   SARS Coronavirus 2 NEGATIVE NEGATIVE Final    Comment: (NOTE) If result  is NEGATIVE SARS-CoV-2 target nucleic acids are NOT DETECTED. The SARS-CoV-2 RNA is generally detectable in upper and lower  respiratory specimens during the acute phase of infection. The lowest  concentration of SARS-CoV-2 viral copies this assay can detect is 250  copies / mL. A negative result does not preclude SARS-CoV-2 infection  and should not be used as the sole basis for treatment or other  patient management decisions.  A negative result may occur with  improper specimen collection / handling, submission of specimen other  than nasopharyngeal swab, presence of viral mutation(s) within the  areas targeted by this assay, and inadequate number of viral copies  (<250 copies / mL). A negative result must be combined with clinical  observations, patient history, and epidemiological information. If result is POSITIVE SARS-CoV-2 target nucleic acids are DETECTED. The SARS-CoV-2 RNA is generally detectable in upper and lower  respiratory specimens dur ing the acute phase of infection.  Positive  results are indicative of active infection with SARS-CoV-2.  Clinical  correlation with patient history and other diagnostic information is  necessary to determine patient infection status.  Positive results do  not rule out bacterial infection or co-infection with other viruses. If result is PRESUMPTIVE POSTIVE SARS-CoV-2 nucleic acids MAY BE PRESENT.   A presumptive positive result was obtained on the submitted specimen  and confirmed on repeat testing.  While 2019 novel coronavirus  (SARS-CoV-2) nucleic acids may be present in the submitted sample  additional confirmatory testing may be necessary for epidemiological  and / or clinical management purposes  to differentiate between  SARS-CoV-2 and other Sarbecovirus currently known to infect humans.  If clinically indicated additional testing with an alternate test  methodology [phone]) is advised. The SARS-CoV-2 RNA is generally   detectable in upper and lower respiratory sp ecimens during the acute  phase of infection. The expected result is Negative. Fact Sheet for Patients:  StrictlyIdeas.no Fact Sheet for Healthcare Providers: BankingDealers.co.za This test is not yet approved or cleared by the [place] FDA and has been authorized for detection and/or diagnosis of SARS-CoV-2 by FDA under an Emergency Use Authorization (EUA).  This EUA will remain in effect (meaning this test can be used)  for the duration of the COVID-19 declaration under Section 564(b)(1) of the Act, 21 U.S.C. section 360bbb-3(b)(1), unless the authorization is terminated or revoked sooner. Performed at [facility], [place] [address] [place], [place] [address]      Time coordinating discharge: 35 minutes  SIGNED:   Hosie Poisson, MD  [facility] 09/23/2018, 6:02 PM Pager   If 7PM-7AM, please contact night-coverage [email or web address] Password [email or web address]

## 2019-10-11 ENCOUNTER — Emergency Department (HOSPITAL_COMMUNITY)
Admission: EM | Admit: 2019-10-11 | Discharge: 2019-10-11 | Discharge status: Against Medical Advice | Payer: No Typology Code available for payment source | Attending: Emergency Medicine | Admitting: Emergency Medicine

## 2019-10-11 ENCOUNTER — Other Ambulatory Visit: Payer: Self-pay

## 2019-10-11 DIAGNOSIS — I129 Hypertensive chronic kidney disease with stage 1 through stage 4 chronic kidney disease, or unspecified chronic kidney disease: Secondary | ICD-10-CM | POA: Diagnosis present

## 2019-10-11 DIAGNOSIS — Z79899 Other long term (current) drug therapy: Secondary | ICD-10-CM | POA: Diagnosis not present

## 2019-10-11 DIAGNOSIS — R0789 Other chest pain: Secondary | ICD-10-CM | POA: Diagnosis not present

## 2019-10-11 DIAGNOSIS — N183 Chronic kidney disease, stage 3 unspecified: Secondary | ICD-10-CM | POA: Diagnosis not present

## 2019-10-11 DIAGNOSIS — I10 Essential (primary) hypertension: Secondary | ICD-10-CM

## 2019-10-11 LAB — CBC WITH DIFFERENTIAL/PLATELET
Abs Immature Granulocytes: 0.04 10*3/uL (ref 0.00–0.07)
Basophils Absolute: 0 10*3/uL (ref 0.0–0.1)
Basophils Relative: 0 %
Eosinophils Absolute: 0.2 10*3/uL (ref 0.0–0.5)
Eosinophils Relative: 2 %
HCT: 33.5 % — ABNORMAL LOW (ref 39.0–52.0)
Hemoglobin: 11 g/dL — ABNORMAL LOW (ref 13.0–17.0)
Immature Granulocytes: 0 %
Lymphocytes Relative: 21 %
Lymphs Abs: 2 10*3/uL (ref 0.7–4.0)
MCH: 27.8 pg (ref 26.0–34.0)
MCHC: 32.8 g/dL (ref 30.0–36.0)
MCV: 84.8 fL (ref 80.0–100.0)
Monocytes Absolute: 0.8 10*3/uL (ref 0.1–1.0)
Monocytes Relative: 9 %
Neutro Abs: 6.6 10*3/uL (ref 1.7–7.7)
Neutrophils Relative %: 68 %
Platelets: 251 10*3/uL (ref 150–400)
RBC: 3.95 MIL/uL — ABNORMAL LOW (ref 4.22–5.81)
RDW: 12.8 % (ref 11.5–15.5)
WBC: 9.8 10*3/uL (ref 4.0–10.5)
nRBC: 0 % (ref 0.0–0.2)

## 2019-10-11 LAB — BASIC METABOLIC PANEL
Anion gap: 14 (ref 5–15)
BUN: 51 mg/dL — ABNORMAL HIGH (ref 6–20)
CO2: 24 mmol/L (ref 22–32)
Calcium: 8.7 mg/dL — ABNORMAL LOW (ref 8.9–10.3)
Chloride: 103 mmol/L (ref 98–111)
Creatinine, Ser: 6.64 mg/dL — ABNORMAL HIGH (ref 0.61–1.24)
GFR calc Af Amer: 10 mL/min — ABNORMAL LOW (ref >60)
GFR calc non Af Amer: 9 mL/min — ABNORMAL LOW (ref >60)
Glucose, Bld: 93 mg/dL (ref 70–99)
Potassium: 3.4 mmol/L — ABNORMAL LOW (ref 3.5–5.1)
Sodium: 141 mmol/L (ref 135–145)

## 2019-10-11 LAB — TROPONIN I (HIGH SENSITIVITY): Troponin I (High Sensitivity): 24 ng/L — ABNORMAL HIGH (ref <18)

## 2019-10-11 MED ORDER — HYDRALAZINE HCL 25 MG PO TABS
100.0000 mg | ORAL_TABLET | Freq: Once | ORAL | Status: AC
  Administered 2019-10-11: 100 mg via ORAL
  Filled 2019-10-11: qty 4

## 2019-10-11 MED ORDER — LOSARTAN POTASSIUM 50 MG PO TABS
100.0000 mg | ORAL_TABLET | Freq: Once | ORAL | Status: AC
  Administered 2019-10-11: 100 mg via ORAL
  Filled 2019-10-11: qty 2

## 2019-10-11 NOTE — ED Provider Notes (Signed)
[facility] EMERGENCY DEPARTMENT Provider Note   CSN: [id number] Arrival date & time: 10/11/19  1120     History Chief Complaint  Patient presents with  . Hypertension    Dylan Sweeney is a 49 y.o. male with a past medical history significant for asthma, HTN, CKD 3, and sickle cell anemia who presents to the ED from nephrology due to elevated blood pressure. Patient is currently on Amlodipine 10mg , Hydralazine 100mg , Labetolol 200mg , and Losartan 100mg . He ran out of Hydralazine and Losartan two weeks ago, but has been compliant with the other medication. Patient denies current chest pain, headache, shortness of breath, abdominal pain, changes to vision, nausea, and vomiting. He notes he had 1 episode of chest pain 3 days ago. Chest pain was left-sided, sharp, and non-radiating and last under 3 minutes. Notes it feels similar to previous CP. No history of MI. Denies associated shortness of breath, nausea, vomiting, and diaphoresis. No lower extremity edema. Patient notes his BP typically runs in [id number] systolic.   Chart reviewed. Patient was just recently admitted to the hospital from 5/19 to 5/22 due to hypertensive emergency due to bump in creatinine.   History obtained from patient and past medical records. No interpreter used during encounter.      Past Medical History:  Diagnosis Date  . Asthma   . Enlarged heart   . Hypertension   . Renal disorder   . Renal insufficiency   . Sickle cell anemia (HCC)    Has the trait-per patient (03/12/15)    Patient Active Problem List   Diagnosis Date Noted  . Hypertensive emergency 09/21/2018  . AKI (acute kidney injury) ([place]) 09/21/2018  . CKD (chronic kidney disease) stage 3, GFR 30-59 ml/min 09/21/2018  . Vomiting 09/21/2018  . Hypokalemia 09/21/2018  . Hypertensive urgency 03/12/2015  . Hypertensive urgency, malignant 03/12/2015  . Marijuana use 03/12/2015  . Back pain   . Renal artery stenosis (HCC)      No past surgical history on file.     Family History  Problem Relation Age of Onset  . Heart disease Mother   . Hypertension Sister   . Asthma Sister   . Hypertension Brother     Social History   Tobacco Use  . Smoking status: Former Smoker    Years: 1.00  . Smokeless tobacco: Never Used  . Tobacco comment: one pack would last a month-08/27/17  Substance Use Topics  . Alcohol use: Yes    Comment: 6 pack/week  . Drug use: No    Home Medications Prior to Admission medications   Medication Sig Start Date End Date Taking? Authorizing Provider  albuterol (PROVENTIL HFA;VENTOLIN HFA) 108 (90 BASE) MCG/ACT inhaler Inhale into the lungs every 6 (six) hours as needed for wheezing or shortness of breath.   Yes [provider]  amLODipine (NORVASC) 10 MG tablet Take 1 tablet (10 mg total) by mouth daily. 03/19/15  Yes Reyne Dumas, MD  cholecalciferol (VITAMIN D) 1000 UNITS tablet Take 1,000 Units by mouth daily.   Yes [provider]  Fluticasone-Salmeterol (ADVAIR) 250-50 MCG/DOSE AEPB Inhale 1 puff into the lungs 2 (two) times daily.   Yes [provider]  folic acid (FOLVITE) 1 MG tablet Take 1 tablet (1 mg total) by mouth daily. 03/19/15  Yes Reyne Dumas, MD  hydrALAZINE (APRESOLINE) 100 MG tablet Take 1 tablet (100 mg total) by mouth every 8 (eight) hours. 03/19/15  Yes Reyne Dumas, MD  labetalol (NORMODYNE) 200  MG tablet Take 2 tablets (400 mg total) by mouth 3 (three) times daily. 09/23/18  Yes Hosie Poisson, MD  losartan (COZAAR) 100 MG tablet Take 100 mg by mouth daily.   Yes [provider]  Multiple Vitamin (MULTIVITAMIN WITH MINERALS) TABS tablet Take 1 tablet by mouth daily. 09/24/18  Yes Hosie Poisson, MD  omega-3 acid ethyl esters (LOVAZA) 1 G capsule Take by mouth 2 (two) times daily.   Yes [provider]  tamsulosin (FLOMAX) 0.4 MG CAPS capsule Take 1 capsule (0.4 mg total) by mouth daily after breakfast. 09/24/18  Yes  Hosie Poisson, MD  thiamine 100 MG tablet Take 1 tablet (100 mg total) by mouth daily. Patient not taking: Reported on 10/11/2019 03/19/15   Reyne Dumas, MD    Allergies    Lisinopril and Other  Review of Systems   Review of Systems  Eyes: Negative for visual disturbance.  Respiratory: Negative for shortness of breath.   Cardiovascular: Negative for chest pain and leg swelling.  Gastrointestinal: Negative for abdominal pain, diarrhea, nausea and vomiting.  Genitourinary: Negative for decreased urine volume and difficulty urinating.  Neurological: Negative for headaches.  All other systems reviewed and are negative.   Physical Exam Updated Vital Signs BP (!) 182/118   Pulse 88   Temp 98.6 F (37 C)   Resp 15   Ht 5\' 11"  (1.803 m)   Wt 82.6 kg   SpO2 98%   BMI 25.38 kg/m   Physical Exam Vitals and nursing note reviewed.  Constitutional:      General: He is not in acute distress.    Appearance: He is not ill-appearing.  HENT:     Head: Normocephalic.  Eyes:     Pupils: Pupils are equal, round, and reactive to light.  Cardiovascular:     Rate and Rhythm: Normal rate and regular rhythm.     Pulses: Normal pulses.     Heart sounds: Normal heart sounds. No murmur. No friction rub. No gallop.   Pulmonary:     Effort: Pulmonary effort is normal.     Breath sounds: Normal breath sounds.  Abdominal:     General: Abdomen is flat. There is no distension.     Palpations: Abdomen is soft.     Tenderness: There is no abdominal tenderness. There is no guarding or rebound.  Musculoskeletal:     Cervical back: Neck supple.     Comments: No lower extremity edema.   Skin:    General: Skin is warm and dry.  Neurological:     General: No focal deficit present.     Mental Status: He is alert.     Comments: Speech is clear, able to follow commands CN III-XII intact Normal strength in upper and lower extremities bilaterally including dorsiflexion and plantar flexion, strong and  equal grip strength Sensation grossly intact throughout Moves extremities without ataxia, coordination intact No pronator drift Ambulates without difficulty  Psychiatric:        Mood and Affect: Mood normal.        Behavior: Behavior normal.     ED Results / Procedures / Treatments   Labs (all labs ordered are listed, but only abnormal results are displayed) Labs Reviewed  CBC WITH DIFFERENTIAL/PLATELET - Abnormal; Notable for the following components:      Result Value   RBC 3.95 (*)    Hemoglobin 11.0 (*)    HCT 33.5 (*)    All other components within normal limits  BASIC METABOLIC  PANEL - Abnormal; Notable for the following components:   Potassium 3.4 (*)    BUN 51 (*)    Creatinine, Ser 6.64 (*)    Calcium 8.7 (*)    GFR calc non Af Amer 9 (*)    GFR calc Af Amer 10 (*)    All other components within normal limits  TROPONIN I (HIGH SENSITIVITY) - Abnormal; Notable for the following components:   Troponin I (High Sensitivity) 24 (*)    All other components within normal limits  TROPONIN I (HIGH SENSITIVITY)    EKG EKG Interpretation  Date/Time:  Wednesday October 11 2019 15:12:46 EDT Ventricular Rate:  80 PR Interval:  174 QRS Duration: 96 QT Interval:  422 QTC Calculation: 486 R Axis:   49 Text Interpretation: Normal sinus rhythm Minimal voltage criteria for LVH, may be normal variant ( Sokolow-Lyon ) Nonspecific ST and T wave abnormality Prolonged QT Abnormal ECG No significant change since last tracing Confirmed by Deno Etienne [phone]) on 10/11/2019 4:35:02 PM   Radiology No results found.  Procedures Procedures (including critical care time)  Medications Ordered in ED Medications  losartan (COZAAR) tablet 100 mg (100 mg Oral Given 10/11/19 1501)  hydrALAZINE (APRESOLINE) tablet 100 mg (100 mg Oral Given 10/11/19 1501)    ED Course  I have reviewed the triage vital signs and the nursing notes.  Pertinent labs & imaging results that were available during my  care of the patient were reviewed by me and considered in my medical decision making (see chart for details).    MDM Rules/Calculators/A&P                     49 year old male presents to the ED from nephrology office due to elevated blood pressure.  Patient recently ran out of hydralazine and losartan 2 weeks ago, but has been compliant with his amlodipine and labetalol.  Patient is currently asymptomatic at this time.  Admits to chest pain 3 days ago.  Upon arrival, elevated BP at 186/123. Chart reviewed and it appears patient's BP is chronically elevated, but slightly higher than baseline. Physical exam reassuring. Will obtain routine labs to rule out end organ damage and 1 troponin given patient's CP 3 days ago. Patient given Losartan and hydralazine here in the ED. Will continue to monitor BP.  CBC reassuring with no leukocytosis. BMP significant for worsening renal function with creatinine at 6.6.4 and BUN at 51. Elevated troponin at 24. Will obtain delta troponin to rule out ACS. EKG personally reviewed which demonstrates normal sinus rhythm with nonspecific ST wave abnormalities.   5:28 PM Informed by RN that patient would like to leave AMA. Talk to patient at bedside and discussed his elevated troponin and need for a second troponin as a way to rule out ACS. Patient still would like to leave AMA. I have discussed my concerns as his provider and the possibility that this may worsen. I have specifically discussed that without further evaluation I cannot guarantee there is not a life threatening event occuring.  Pt is A&Ox4, his own POA and states understanding of my concerns and the possible consequences.  I have made pt aware that this is an [place] discharge, but he may return at any time for further evaluation and treatment.  Final Clinical Impression(s) / ED Diagnoses Final diagnoses:  Hypertension, unspecified type    Rx / DC Orders ED Discharge Orders    None       Karel Jarvis, Druscilla Brownie,  PA-C 10/11/19 [place], [place], DO 10/12/19 1241

## 2019-10-11 NOTE — Discharge Instructions (Addendum)
You chose to leave against medical advice. Your first cardiac marker is elevated which is usually indicative of heart damage. You may return to the ER at anytime for further evaluation. Continue to take your blood pressure medications are prescribed. Follow-up with PCP within the next week.

## 2019-10-11 NOTE — ED Triage Notes (Signed)
Pt here from nephrologist for eval of hypertension. Pt has hx of same but has run out of losartan and hydralazine. Nephrologist refilled but sent here for eval to see if dosages/medication should be changed.

## 2019-10-11 NOTE — ED Notes (Signed)
Pt did not want to sign AMA form, pt given papers. Pt given strict return precautions, verbalized understanding.

## 2019-12-28 ENCOUNTER — Other Ambulatory Visit: Payer: Self-pay

## 2019-12-28 DIAGNOSIS — N183 Chronic kidney disease, stage 3 unspecified: Secondary | ICD-10-CM

## 2020-01-09 ENCOUNTER — Ambulatory Visit (INDEPENDENT_AMBULATORY_CARE_PROVIDER_SITE_OTHER)
Admission: RE | Admit: 2020-01-09 | Discharge: 2020-01-09 | Disposition: A | Discharge status: Home / Self Care | Payer: No Typology Code available for payment source | Source: Ambulatory Visit | Attending: Vascular Surgery | Admitting: Vascular Surgery

## 2020-01-09 ENCOUNTER — Other Ambulatory Visit: Payer: Self-pay

## 2020-01-09 ENCOUNTER — Ambulatory Visit (INDEPENDENT_AMBULATORY_CARE_PROVIDER_SITE_OTHER): Payer: No Typology Code available for payment source | Admitting: Vascular Surgery

## 2020-01-09 ENCOUNTER — Encounter: Payer: Self-pay | Admitting: Vascular Surgery

## 2020-01-09 ENCOUNTER — Ambulatory Visit (HOSPITAL_COMMUNITY)
Admission: RE | Admit: 2020-01-09 | Discharge: 2020-01-09 | Disposition: A | Discharge status: Home / Self Care | Payer: No Typology Code available for payment source | Source: Ambulatory Visit | Attending: Vascular Surgery | Admitting: Vascular Surgery

## 2020-01-09 DIAGNOSIS — N183 Chronic kidney disease, stage 3 unspecified: Secondary | ICD-10-CM

## 2020-01-09 DIAGNOSIS — N184 Chronic kidney disease, stage 4 (severe): Secondary | ICD-10-CM

## 2020-01-09 DIAGNOSIS — N179 Acute kidney failure, unspecified: Secondary | ICD-10-CM | POA: Insufficient documentation

## 2020-01-09 DIAGNOSIS — I129 Hypertensive chronic kidney disease with stage 1 through stage 4 chronic kidney disease, or unspecified chronic kidney disease: Secondary | ICD-10-CM

## 2020-01-09 NOTE — Progress Notes (Signed)
Patient name: Dylan Sweeney MRN: [id number] DOB: 1970/09/01 Sex: male  REASON FOR CONSULT: Evaluate permanent dialysis access  HPI: Dylan Sweeney is a 49 y.o. male, with history of malignant hypertension and stage IV CKD that presents for evaluation of permanent dialysis access.  Patient is not currently on dialysis at this time.  Has had no dialysis access in the past.  Has had multiple hospitalizations for blood pressure issues and subsequent acute kidney injury.  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  He is interested in a kidney transplant and has been referred to [facility] for evaluation.  He has been referred here by Dr. Moshe Cipro his nephrologist.  Past Medical History:  Diagnosis Date  . Asthma   . Enlarged heart   . Hypertension   . Renal disorder   . Renal insufficiency   . Sickle cell anemia (HCC)    Has the trait-per patient (03/12/15)    History reviewed. No pertinent surgical history.  Family History  Problem Relation Age of Onset  . Heart disease Mother   . Hypertension Sister   . Asthma Sister   . Hypertension Brother     SOCIAL HISTORY: Social History   Socioeconomic History  . Marital status: Divorced    Spouse name: Not on file  . Number of children: Not on file  . Years of education: Not on file  . Highest education level: Not on file  Occupational History  . Not on file  Tobacco Use  . Smoking status: Former Smoker    Years: 1.00  . Smokeless tobacco: Never Used  . Tobacco comment: one pack would last a month-08/27/17  Substance and Sexual Activity  . Alcohol use: Yes    Comment: 6 pack/week  . Drug use: No  . Sexual activity: Not on file  Other Topics Concern  . Not on file  Social History Narrative  . Not on file   Social Determinants of Health   Financial Resource Strain:   . Difficulty of Paying Living Expenses: Not on file  Food Insecurity:   . Worried About  Charity fundraiser in the Last Year: Not on file  . Ran Out of Food in the Last Year: Not on file  Transportation Needs:   . Lack of Transportation (Medical): Not on file  . Lack of Transportation (Non-Medical): Not on file  Physical Activity:   . Days of Exercise per Week: Not on file  . Minutes of Exercise per Session: Not on file  Stress:   . Feeling of Stress : Not on file  Social Connections:   . Frequency of Communication with Friends and Family: Not on file  . Frequency of Social Gatherings with Friends and Family: Not on file  . Attends Religious Services: Not on file  . Active Member of Clubs or Organizations: Not on file  . Attends Archivist Meetings: Not on file  . Marital Status: Not on file  Intimate Partner Violence:   . Fear of Current or Ex-Partner: Not on file  . Emotionally Abused: Not on file  . Physically Abused: Not on file  . Sexually Abused: Not on file    Allergies  Allergen Reactions  . Lisinopril Other (See Comments)    Couldn't stop coughing  . Other Other (See Comments)    G6 pill allergy: pill given before travel in [place]    Current Outpatient Medications  Medication Sig Dispense Refill  . albuterol (PROVENTIL HFA;VENTOLIN HFA) 108 (90 BASE) MCG/ACT inhaler Inhale into the lungs every 6 (six) hours as needed for wheezing or shortness of breath.    Marland Kitchen amLODipine (NORVASC) 10 MG tablet Take 1 tablet (10 mg total) by mouth daily. 30 tablet 2  . cholecalciferol (VITAMIN D) 1000 UNITS tablet Take 1,000 Units by mouth daily.    . Fluticasone-Salmeterol (ADVAIR) 250-50 MCG/DOSE AEPB Inhale 1 puff into the lungs 2 (two) times daily.    . folic acid (FOLVITE) 1 MG tablet Take 1 tablet (1 mg total) by mouth daily. 30 tablet 2  . hydrALAZINE (APRESOLINE) 100 MG tablet Take 1 tablet (100 mg total) by mouth every 8 (eight) hours. 90 tablet 2  . labetalol (NORMODYNE) 200 MG tablet Take 2 tablets (400 mg total) by mouth 3 (three) times daily. 90  tablet 1  . losartan (COZAAR) 100 MG tablet Take 100 mg by mouth daily.    . Multiple Vitamin (MULTIVITAMIN WITH MINERALS) TABS tablet Take 1 tablet by mouth daily.    Marland Kitchen omega-3 acid ethyl esters (LOVAZA) 1 G capsule Take by mouth 2 (two) times daily.    . tamsulosin (FLOMAX) 0.4 MG CAPS capsule Take 1 capsule (0.4 mg total) by mouth daily after breakfast. 30 capsule 0  . thiamine 100 MG tablet Take 1 tablet (100 mg total) by mouth daily. 30 tablet 0   No current facility-administered medications for this visit.    REVIEW OF SYSTEMS:  [X]  denotes positive finding, [ ]  denotes negative finding Cardiac  Comments:  Chest pain or chest pressure: x   Shortness of breath upon exertion: x   Short of breath when lying flat: x   Irregular heart rhythm:        Vascular    Pain in calf, thigh, or hip brought on by ambulation:    Pain in feet at night that wakes you up from your sleep:  x   Blood clot in your veins:    Leg swelling:         Pulmonary    Oxygen at home:    Productive cough:     Wheezing:         Neurologic    Sudden weakness in arms or legs:     Sudden numbness in arms or legs:     Sudden onset of difficulty speaking or slurred speech:    Temporary loss of vision in one eye:     Problems with dizziness:  x       Gastrointestinal    Blood in stool:     Vomited blood:         Genitourinary    Burning when urinating:     Blood in urine:        Psychiatric    Major depression:         Hematologic    Bleeding problems:    Problems with blood clotting too easily:        Skin    Rashes or ulcers:        Constitutional    Fever or chills:      PHYSICAL EXAM: Vitals:   01/09/20 1022  BP: (!) 172/132  Pulse: 86  Resp: 16  Temp: 98 F (36.7 C)  TempSrc: Temporal  SpO2: 98%  Weight: 174 lb (78.9 kg)  Height: 5\' 11"  (1.803 m)    GENERAL: The patient is a well-nourished male, in no acute distress. The  vital signs are documented above. CARDIAC: There is a  regular rate and rhythm.  VASCULAR:  Palpable radial and brachial pulses in bilateral upper extremities No upper extremity tissue loss PULMONARY: There is good air exchange bilaterally without wheezing or rales. ABDOMEN: Soft and non-tender with normal pitched bowel sounds.  MUSCULOSKELETAL: There are no major deformities or cyanosis. NEUROLOGIC: No focal weakness or paresthesias are detected. SKIN: There are no ulcers or rashes noted. PSYCHIATRIC: The patient has a normal affect.  DATA:   Arterial duplex shows triphasic waveforms in bilateral upper extremities with bifurcation at the axilla.  Upper extremity vein mapping shows usable cephalic vein in the left arm which is his nondominant arm including a 2.5 mm vein at the wrist that may be usable.  Assessment/Plan:  49 year old male with history of malignant hypertension and stage IV CKD that presents for permanent dialysis access evaluation.  Discussed plans for placement in the nondominant arm which would be his left arm.  He does have a high bifurcation at the axilla of the artery and discussed this sometimes can be a limitation for fistula maturation given the size of the artery distally.  On vein mapping he does have a nice cephalic vein potentially at the wrist and discuss options of radiocephalic versus brachiocephalic in the left arm pending evaluation with ultrasound in the OR.  We discussed risks benefits including risk of bleeding, infection, failure to mature, steal syndrome, etc.  I offered to get him scheduled today but he wants to talk to Dr. Moshe Cipro next week.  I gave him information to contact hour scheduling nurse and get on the schedule when he feels appropriate to proceed.   [name], MD [facility] Office: [phone]

## 2020-01-25 ENCOUNTER — Encounter: Payer: Self-pay | Admitting: *Deleted

## 2020-01-25 ENCOUNTER — Other Ambulatory Visit: Payer: Self-pay | Admitting: *Deleted

## 2020-02-02 ENCOUNTER — Encounter (HOSPITAL_COMMUNITY): Payer: Self-pay | Admitting: Vascular Surgery

## 2020-02-02 ENCOUNTER — Other Ambulatory Visit (HOSPITAL_COMMUNITY): Payer: No Typology Code available for payment source

## 2020-02-02 NOTE — Progress Notes (Signed)
PCP - [facility] Cardiologist - n/a  Chest x-ray - n/a EKG - 10/11/19 Stress Test - pt states 4/5/ yrs ago ECHO - 09/21/18 Cardiac Cath -   Anesthesia review: Yes  STOP now taking any Aspirin (unless otherwise instructed by your surgeon), Aleve, Naproxen, Ibuprofen, Motrin, Advil, Goody's, BC's, all herbal medications, fish oil, and all vitamins.   Coronavirus Screening Covid test scheduled on 02/03/20 Do you have any of the following symptoms:  Cough yes/no: No Fever (>100.8F)  yes/no: No Runny nose yes/no: No Sore throat yes/no: No Difficulty breathing/shortness of breath  yes/no: No  Have you traveled in the last 14 days and where? yes/no: No  Patient verbalized understanding of instructions that were given via phone.

## 2020-02-03 ENCOUNTER — Other Ambulatory Visit (HOSPITAL_COMMUNITY)
Admission: RE | Admit: 2020-02-03 | Discharge: 2020-02-03 | Disposition: A | Discharge status: Home / Self Care | Payer: No Typology Code available for payment source | Source: Ambulatory Visit | Attending: Vascular Surgery | Admitting: Vascular Surgery

## 2020-02-03 DIAGNOSIS — Z20822 Contact with and (suspected) exposure to covid-19: Secondary | ICD-10-CM | POA: Diagnosis not present

## 2020-02-03 DIAGNOSIS — Z01812 Encounter for preprocedural laboratory examination: Secondary | ICD-10-CM | POA: Insufficient documentation

## 2020-02-03 LAB — SARS CORONAVIRUS 2 (TAT 6-24 HRS): SARS Coronavirus 2: NEGATIVE

## 2020-02-05 ENCOUNTER — Other Ambulatory Visit: Payer: Self-pay

## 2020-02-05 ENCOUNTER — Ambulatory Visit (HOSPITAL_COMMUNITY): Payer: No Typology Code available for payment source | Admitting: Physician Assistant

## 2020-02-05 ENCOUNTER — Encounter (HOSPITAL_COMMUNITY)
Admission: RE | Disposition: A | Discharge status: Home / Self Care | Payer: Self-pay | Source: Home / Self Care | Attending: Vascular Surgery

## 2020-02-05 ENCOUNTER — Ambulatory Visit (HOSPITAL_COMMUNITY)
Admission: RE | Admit: 2020-02-05 | Discharge: 2020-02-05 | Disposition: A | Discharge status: Home / Self Care | Payer: No Typology Code available for payment source | Attending: Vascular Surgery | Admitting: Vascular Surgery

## 2020-02-05 ENCOUNTER — Encounter (HOSPITAL_COMMUNITY): Payer: Self-pay | Admitting: Vascular Surgery

## 2020-02-05 DIAGNOSIS — N184 Chronic kidney disease, stage 4 (severe): Secondary | ICD-10-CM | POA: Diagnosis not present

## 2020-02-05 DIAGNOSIS — Z888 Allergy status to other drugs, medicaments and biological substances status: Secondary | ICD-10-CM | POA: Insufficient documentation

## 2020-02-05 DIAGNOSIS — J45909 Unspecified asthma, uncomplicated: Secondary | ICD-10-CM | POA: Diagnosis not present

## 2020-02-05 DIAGNOSIS — Z87891 Personal history of nicotine dependence: Secondary | ICD-10-CM | POA: Insufficient documentation

## 2020-02-05 DIAGNOSIS — Z7951 Long term (current) use of inhaled steroids: Secondary | ICD-10-CM | POA: Insufficient documentation

## 2020-02-05 DIAGNOSIS — Z79899 Other long term (current) drug therapy: Secondary | ICD-10-CM | POA: Diagnosis not present

## 2020-02-05 DIAGNOSIS — D573 Sickle-cell trait: Secondary | ICD-10-CM | POA: Diagnosis not present

## 2020-02-05 DIAGNOSIS — I739 Peripheral vascular disease, unspecified: Secondary | ICD-10-CM | POA: Diagnosis not present

## 2020-02-05 DIAGNOSIS — I1310 Hypertensive heart and chronic kidney disease without heart failure, with stage 1 through stage 4 chronic kidney disease, or unspecified chronic kidney disease: Secondary | ICD-10-CM | POA: Diagnosis present

## 2020-02-05 DIAGNOSIS — G4730 Sleep apnea, unspecified: Secondary | ICD-10-CM | POA: Diagnosis not present

## 2020-02-05 HISTORY — DX: Sickle-cell trait: D57.3

## 2020-02-05 HISTORY — DX: Sleep apnea, unspecified: G47.30

## 2020-02-05 HISTORY — DX: Personal history of urinary calculi: Z87.442

## 2020-02-05 HISTORY — DX: Peripheral vascular disease, unspecified: I73.9

## 2020-02-05 HISTORY — PX: AV FISTULA PLACEMENT: SHX1204

## 2020-02-05 HISTORY — DX: Depression, unspecified: F32.A

## 2020-02-05 LAB — POCT I-STAT, CHEM 8
BUN: 57 mg/dL — ABNORMAL HIGH (ref 6–20)
Calcium, Ion: 1.17 mmol/L (ref 1.15–1.40)
Chloride: 106 mmol/L (ref 98–111)
Creatinine, Ser: 7.3 mg/dL — ABNORMAL HIGH (ref 0.61–1.24)
Glucose, Bld: 89 mg/dL (ref 70–99)
HCT: 31 % — ABNORMAL LOW (ref 39.0–52.0)
Hemoglobin: 10.5 g/dL — ABNORMAL LOW (ref 13.0–17.0)
Potassium: 4.1 mmol/L (ref 3.5–5.1)
Sodium: 138 mmol/L (ref 135–145)
TCO2: 19 mmol/L — ABNORMAL LOW (ref 22–32)

## 2020-02-05 SURGERY — ARTERIOVENOUS (AV) FISTULA CREATION
Anesthesia: Monitor Anesthesia Care | Site: Arm Upper | Laterality: Left

## 2020-02-05 MED ORDER — PAPAVERINE HCL 30 MG/ML IJ SOLN
INTRAMUSCULAR | Status: AC
  Filled 2020-02-05: qty 2

## 2020-02-05 MED ORDER — PROPOFOL 500 MG/50ML IV EMUL
INTRAVENOUS | Status: DC | PRN
  Administered 2020-02-05: 75 ug/kg/min via INTRAVENOUS

## 2020-02-05 MED ORDER — SODIUM CHLORIDE 0.9 % IV SOLN: INTRAVENOUS | Status: DC

## 2020-02-05 MED ORDER — FENTANYL CITRATE (PF) 100 MCG/2ML IJ SOLN: 25.0000 ug | INTRAMUSCULAR | Status: DC | PRN

## 2020-02-05 MED ORDER — 0.9 % SODIUM CHLORIDE (POUR BTL) OPTIME
TOPICAL | Status: DC | PRN
  Administered 2020-02-05: 1000 mL

## 2020-02-05 MED ORDER — ONDANSETRON HCL 4 MG/2ML IJ SOLN: 4.0000 mg | Freq: Four times a day (QID) | INTRAMUSCULAR | Status: DC | PRN

## 2020-02-05 MED ORDER — LIDOCAINE HCL (CARDIAC) PF 100 MG/5ML IV SOSY
PREFILLED_SYRINGE | INTRAVENOUS | Status: DC | PRN
  Administered 2020-02-05: 40 mg via INTRAVENOUS

## 2020-02-05 MED ORDER — LABETALOL HCL 5 MG/ML IV SOLN
INTRAVENOUS | Status: AC
  Filled 2020-02-05: qty 4

## 2020-02-05 MED ORDER — CHLORHEXIDINE GLUCONATE 4 % EX LIQD: 60.0000 mL | Freq: Once | CUTANEOUS | Status: DC

## 2020-02-05 MED ORDER — ONDANSETRON HCL 4 MG/2ML IJ SOLN
INTRAMUSCULAR | Status: DC | PRN
  Administered 2020-02-05: 4 mg via INTRAVENOUS

## 2020-02-05 MED ORDER — HYDROCODONE-ACETAMINOPHEN 5-325 MG PO TABS
1.0000 | ORAL_TABLET | ORAL | 0 refills | Status: AC | PRN
Start: 2020-02-05 — End: 2021-02-04

## 2020-02-05 MED ORDER — PROPOFOL 10 MG/ML IV BOLUS
INTRAVENOUS | Status: AC
  Filled 2020-02-05: qty 20

## 2020-02-05 MED ORDER — LABETALOL HCL 5 MG/ML IV SOLN
10.0000 mg | Freq: Once | INTRAVENOUS | Status: AC
  Administered 2020-02-05: 10 mg via INTRAVENOUS
  Filled 2020-02-05: qty 4

## 2020-02-05 MED ORDER — HEPARIN SODIUM (PORCINE) 1000 UNIT/ML IJ SOLN
INTRAMUSCULAR | Status: DC | PRN
  Administered 2020-02-05: 3000 [IU] via INTRAVENOUS

## 2020-02-05 MED ORDER — SODIUM CHLORIDE 0.9 % IV SOLN
INTRAVENOUS | Status: AC
  Filled 2020-02-05: qty 1.2

## 2020-02-05 MED ORDER — MIDAZOLAM HCL 5 MG/5ML IJ SOLN
INTRAMUSCULAR | Status: DC | PRN
  Administered 2020-02-05 (×2): 1 mg via INTRAVENOUS

## 2020-02-05 MED ORDER — ORAL CARE MOUTH RINSE: 15.0000 mL | Freq: Once | OROMUCOSAL | Status: AC

## 2020-02-05 MED ORDER — PROPOFOL 1000 MG/100ML IV EMUL
INTRAVENOUS | Status: AC
  Filled 2020-02-05: qty 100

## 2020-02-05 MED ORDER — LIDOCAINE HCL 1 % IJ SOLN
INTRAMUSCULAR | Status: AC
  Filled 2020-02-05: qty 20

## 2020-02-05 MED ORDER — SODIUM CHLORIDE 0.9 % IV SOLN
INTRAVENOUS | Status: DC | PRN
  Administered 2020-02-05: 07:00:00 500 mL

## 2020-02-05 MED ORDER — FENTANYL CITRATE (PF) 250 MCG/5ML IJ SOLN
INTRAMUSCULAR | Status: AC
  Filled 2020-02-05: qty 5

## 2020-02-05 MED ORDER — OXYCODONE HCL 5 MG PO TABS: 5.0000 mg | ORAL_TABLET | Freq: Once | ORAL | Status: DC | PRN

## 2020-02-05 MED ORDER — CHLORHEXIDINE GLUCONATE 0.12 % MT SOLN
OROMUCOSAL | Status: AC
  Administered 2020-02-05: 15 mL via OROMUCOSAL
  Filled 2020-02-05: qty 15

## 2020-02-05 MED ORDER — CEFAZOLIN SODIUM-DEXTROSE 2-4 GM/100ML-% IV SOLN
INTRAVENOUS | Status: AC
  Filled 2020-02-05: qty 100

## 2020-02-05 MED ORDER — PROPOFOL 10 MG/ML IV BOLUS
INTRAVENOUS | Status: DC | PRN
  Administered 2020-02-05 (×2): 30 mg via INTRAVENOUS

## 2020-02-05 MED ORDER — MIDAZOLAM HCL 2 MG/2ML IJ SOLN
INTRAMUSCULAR | Status: AC
  Filled 2020-02-05: qty 2

## 2020-02-05 MED ORDER — LIDOCAINE HCL 1 % IJ SOLN
INTRAMUSCULAR | Status: DC | PRN
  Administered 2020-02-05: 15 mL

## 2020-02-05 MED ORDER — HYDRALAZINE HCL 20 MG/ML IJ SOLN
10.0000 mg | Freq: Once | INTRAMUSCULAR | Status: AC
  Administered 2020-02-05: 10 mg via INTRAVENOUS
  Filled 2020-02-05: qty 0.5
  Filled 2020-02-05: qty 1

## 2020-02-05 MED ORDER — FENTANYL CITRATE (PF) 100 MCG/2ML IJ SOLN
INTRAMUSCULAR | Status: DC | PRN
Start: 2020-02-05 — End: 2020-02-05
  Administered 2020-02-05 (×3): 25 ug via INTRAVENOUS

## 2020-02-05 MED ORDER — OXYCODONE HCL 5 MG/5ML PO SOLN: 5.0000 mg | Freq: Once | ORAL | Status: DC | PRN

## 2020-02-05 MED ORDER — CHLORHEXIDINE GLUCONATE 0.12 % MT SOLN: 15.0000 mL | Freq: Once | OROMUCOSAL | Status: AC

## 2020-02-05 MED ORDER — CEFAZOLIN SODIUM-DEXTROSE 2-4 GM/100ML-% IV SOLN
2.0000 g | INTRAVENOUS | Status: AC
  Administered 2020-02-05: 2 g via INTRAVENOUS

## 2020-02-05 SURGICAL SUPPLY — 41 items
ARMBAND PINK RESTRICT EXTREMIT (MISCELLANEOUS) ×3 IMPLANT
BLADE CLIPPER SURG (BLADE) IMPLANT
CANISTER SUCT 3000ML PPV (MISCELLANEOUS) ×3 IMPLANT
CLIP VESOCCLUDE MED 6/CT (CLIP) ×3 IMPLANT
CLIP VESOCCLUDE SM WIDE 6/CT (CLIP) ×6 IMPLANT
COVER PROBE W GEL 5X96 (DRAPES) ×3 IMPLANT
COVER WAND RF STERILE (DRAPES) IMPLANT
DECANTER SPIKE VIAL GLASS SM (MISCELLANEOUS) ×3 IMPLANT
DERMABOND ADVANCED (GAUZE/BANDAGES/DRESSINGS) ×2
DERMABOND ADVANCED .7 DNX12 (GAUZE/BANDAGES/DRESSINGS) ×1 IMPLANT
ELECT REM PT RETURN 9FT ADLT (ELECTROSURGICAL) ×3
ELECTRODE REM PT RTRN 9FT ADLT (ELECTROSURGICAL) ×1 IMPLANT
GLOVE BIO SURGEON STRL SZ 6.5 (GLOVE) ×2 IMPLANT
GLOVE BIO SURGEON STRL SZ7.5 (GLOVE) ×3 IMPLANT
GLOVE BIO SURGEONS STRL SZ 6.5 (GLOVE) ×1
GLOVE BIOGEL PI IND STRL 6.5 (GLOVE) ×2 IMPLANT
GLOVE BIOGEL PI IND STRL 8 (GLOVE) ×1 IMPLANT
GLOVE BIOGEL PI INDICATOR 6.5 (GLOVE) ×4
GLOVE BIOGEL PI INDICATOR 8 (GLOVE) ×2
GLOVE SS BIOGEL STRL SZ 6.5 (GLOVE) ×1 IMPLANT
GLOVE SUPERSENSE BIOGEL SZ 6.5 (GLOVE) ×2
GOWN STRL REUS W/ TWL LRG LVL3 (GOWN DISPOSABLE) ×2 IMPLANT
GOWN STRL REUS W/ TWL XL LVL3 (GOWN DISPOSABLE) ×2 IMPLANT
GOWN STRL REUS W/TWL LRG LVL3 (GOWN DISPOSABLE) ×6
GOWN STRL REUS W/TWL XL LVL3 (GOWN DISPOSABLE) ×6
HEMOSTAT SPONGE AVITENE ULTRA (HEMOSTASIS) IMPLANT
KIT BASIN OR (CUSTOM PROCEDURE TRAY) ×3 IMPLANT
KIT TURNOVER KIT B (KITS) ×3 IMPLANT
LOOP VESSEL MINI RED (MISCELLANEOUS) ×3 IMPLANT
NS IRRIG 1000ML POUR BTL (IV SOLUTION) ×3 IMPLANT
PACK CV ACCESS (CUSTOM PROCEDURE TRAY) ×3 IMPLANT
PAD ARMBOARD 7.5X6 YLW CONV (MISCELLANEOUS) ×6 IMPLANT
SUT MNCRL AB 4-0 PS2 18 (SUTURE) ×3 IMPLANT
SUT PROLENE 6 0 BV (SUTURE) ×6 IMPLANT
SUT PROLENE 7 0 BV 1 (SUTURE) IMPLANT
SUT VIC AB 3-0 SH 27 (SUTURE) ×3
SUT VIC AB 3-0 SH 27X BRD (SUTURE) ×1 IMPLANT
SYR BULB IRRIG 60ML STRL (SYRINGE) ×3 IMPLANT
TOWEL GREEN STERILE (TOWEL DISPOSABLE) ×3 IMPLANT
UNDERPAD 30X36 HEAVY ABSORB (UNDERPADS AND DIAPERS) ×3 IMPLANT
WATER STERILE IRR 1000ML POUR (IV SOLUTION) ×3 IMPLANT

## 2020-02-05 NOTE — Anesthesia Preprocedure Evaluation (Signed)
Anesthesia Evaluation  Patient identified by MRN, date of birth, ID band Patient awake    Reviewed: Allergy & Precautions, H&P , NPO status , Patient's Chart, lab work & pertinent test results  Airway Mallampati: II   Neck ROM: full    Dental   Pulmonary asthma , sleep apnea , former smoker,    breath sounds clear to auscultation       Cardiovascular hypertension, + Peripheral Vascular Disease   Rhythm:regular Rate:Normal  TTE (09/2018): EF 55-60%, mod LVH.   Neuro/Psych PSYCHIATRIC DISORDERS Depression    GI/Hepatic   Endo/Other    Renal/GU ESRFRenal disease     Musculoskeletal   Abdominal   Peds  Hematology  (+) Sickle cell trait ,   Anesthesia Other Findings   Reproductive/Obstetrics                             Anesthesia Physical Anesthesia Plan  ASA: III  Anesthesia Plan: MAC   Post-op Pain Management:    Induction: Intravenous  PONV Risk Score and Plan: 1 and Ondansetron, Propofol infusion, Midazolam and Treatment may vary due to age or medical condition  Airway Management Planned: Simple Face Mask  Additional Equipment:   Intra-op Plan:   Post-operative Plan:   Informed Consent: I have reviewed the patients History and Physical, chart, labs and discussed the procedure including the risks, benefits and alternatives for the proposed anesthesia with the patient or authorized representative who has indicated his/her understanding and acceptance.       Plan Discussed with: CRNA, Anesthesiologist and Surgeon  Anesthesia Plan Comments:         Anesthesia Quick Evaluation

## 2020-02-05 NOTE — Op Note (Addendum)
OPERATIVE NOTE   PROCEDURE: left brachiocephalic arteriovenous fistula placement  PRE-OPERATIVE DIAGNOSIS: chronic kidney disease stage IV  POST-OPERATIVE DIAGNOSIS: same as above   SURGEON: [name], MD  ASSISTANT(S): Risa Grill, PA  ANESTHESIA: MAC  ESTIMATED BLOOD LOSS: Minimal  FINDING(S): 1.  Cephalic vein: 4 mm, acceptable 2.  Brachial artery: 4 mm, high artery bifurcation at the axilla 3.  Venous outflow: palpable thrill  4.  Radial flow: palpable radial pulse  SPECIMEN(S):  none  INDICATIONS:   Dylan Sweeney is a 49 y.o. male who presents with CKD stage IV and need for permanent hemodialysis access.  The patient is scheduled for left arm arteriovenous fistula placement.  The patient is aware the risks include but are not limited to: bleeding, infection, steal syndrome, nerve damage, ischemic monomelic neuropathy, failure to mature, and need for additional procedures.  The patient is aware of the risks of the procedure and elects to proceed forward.  An assistant was needed for exposure and to facilitate the case being done in a timely fashion.   DESCRIPTION: After full informed written consent was obtained from the patient, the patient was brought back to the operating room and placed supine upon the operating table.  Prior to induction, the patient received IV antibiotics.   After obtaining adequate anesthesia, the patient was then prepped and draped in the standard fashion for a left arm access procedure.  I turned my attention first to identifying the patient's cephalic vein.  The vein appeared small by the wrist and looked more robust just below the antecubital fossa.  Using SonoSite guidance, the location of these vessels were marked out on the skin.   I also marked the larger artery given high axillary bifurcation.  At this point, I injected local anesthetic to obtain a field block of the antecubitum.  In total, I injected about 17 mL of 1%  lidocaine without epinephrine.  I made a transverse incision below the level of the antecubitum and dissected through the subcutaneous tissue and fascia to gain exposure of the brachial artery.  There was a high artery bifurcation at the axilla and I dissected out the larger branch which was likely the ulnar artery.  This was noted to be 4 mm in diameter externally.  This was dissected out proximally and distally and controlled with vessel loops .  I then dissected out the cephalic vein.  This was noted to be 4 mm in diameter externally.  The distal segment of the vein was ligated with a  2-0 silk, and the vein was transected.  The proximal segment was interrogated with serial dilators.  The vein accepted up to a 5 mm dilator without any difficulty.  I then instilled the heparinized saline into the vein and clamped it.  At this point, I reset my exposure of the brachial artery.  The patient was given 3000 units IV heparin.  I then placed the artery under tension proximally and distally.  I made an arteriotomy with a #11 blade, and then I extended the arteriotomy with a Potts scissor.  I injected heparinized saline proximal and distal to this arteriotomy.  The vein was then sewn to the artery in an end-to-side configuration with a running stitch of 6-0 Prolene.  Prior to completing this anastomosis, I allowed the vein and artery to backbleed.  There was no evidence of clot from any vessels.  I completed the anastomosis in the usual fashion and then released all vessel loops  and clamps.    There was a palpable thrill in the venous outflow, and there was a palpable radial pulse and also very brisk ulnar signal that was triphasic.  At this point, I irrigated out the surgical wound.  There was no further active bleeding.  The subcutaneous tissue was reapproximated with a running stitch of 3-0 Vicryl.  The skin was then reapproximated with a running subcuticular stitch of 4-0 Monocrylk.  The skin was then cleaned,  dried, and reinforced with Dermabond.  The patient tolerated this procedure well.   COMPLICATIONS: None  CONDITION: Stable  [name], MD [facility]: [phone]  02/05/2020, 8:42 AM

## 2020-02-05 NOTE — H&P (Signed)
History and Physical Interval Note:  02/05/2020 7:19 AM  Dylan Sweeney  has presented today for surgery, with the diagnosis of ckd.  The various methods of treatment have been discussed with the patient and family. After consideration of risks, benefits and other options for treatment, the patient has consented to  Procedure(s): ARTERIOVENOUS (AV) FISTULA CREATION VS GRAFT LEFT (Left) as a surgical intervention.  The patient's history has been reviewed, patient examined, no change in status, stable for surgery.  I have reviewed the patient's chart and labs.  Questions were answered to the patient's satisfaction.    Left arm AVF.  [name]   Patient name: Dylan Sweeney           MRN: [id number]        DOB: 1970/11/05            Sex: male  REASON FOR CONSULT: Evaluate permanent dialysis access  HPI: SAL Dylan Sweeney is a 49 y.o. male, with history of malignant hypertension and stage IV CKD that presents for evaluation of permanent dialysis access.  Patient is not currently on dialysis at this time.  Has had no dialysis access in the past.  Has had multiple hospitalizations for blood pressure issues and subsequent acute kidney injury.  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  He is interested in a kidney transplant and has been referred to [facility] for evaluation.  He has been referred here by Dr. Moshe Cipro his nephrologist.      Past Medical History:  Diagnosis Date  . Asthma   . Enlarged heart   . Hypertension   . Renal disorder   . Renal insufficiency   . Sickle cell anemia (HCC)    Has the trait-per patient (03/12/15)    History reviewed. No pertinent surgical history.       Family History  Problem Relation Age of Onset  . Heart disease Mother   . Hypertension Sister   . Asthma Sister   . Hypertension Brother     SOCIAL HISTORY: Social History        Socioeconomic History  .  Marital status: Divorced    Spouse name: Not on file  . Number of children: Not on file  . Years of education: Not on file  . Highest education level: Not on file  Occupational History  . Not on file  Tobacco Use  . Smoking status: Former Smoker    Years: 1.00  . Smokeless tobacco: Never Used  . Tobacco comment: one pack would last a month-08/27/17  Substance and Sexual Activity  . Alcohol use: Yes    Comment: 6 pack/week  . Drug use: No  . Sexual activity: Not on file  Other Topics Concern  . Not on file  Social History Narrative  . Not on file   Social Determinants of Health      Financial Resource Strain:   . Difficulty of Paying Living Expenses: Not on file  Food Insecurity:   . Worried About Charity fundraiser in the Last Year: Not on file  . Ran Out of Food in the Last Year: Not on file  Transportation Needs:   . Lack of Transportation (Medical): Not on file  . Lack of Transportation (Non-Medical): Not on file  Physical Activity:   . Days of Exercise per Week: Not on file  . Minutes of Exercise per Session: Not on file  Stress:   . Feeling of Stress : Not on file  Social Connections:   . Frequency of Communication with Friends and Family: Not on file  . Frequency of Social Gatherings with Friends and Family: Not on file  . Attends Religious Services: Not on file  . Active Member of Clubs or Organizations: Not on file  . Attends Archivist Meetings: Not on file  . Marital Status: Not on file  Intimate Partner Violence:   . Fear of Current or Ex-Partner: Not on file  . Emotionally Abused: Not on file  . Physically Abused: Not on file  . Sexually Abused: Not on file         Allergies  Allergen Reactions  . Lisinopril Other (See Comments)    Couldn't stop coughing  . Other Other (See Comments)    G6 pill allergy: pill given before travel in [place]          Current Outpatient Medications  Medication Sig Dispense Refill  .  albuterol (PROVENTIL HFA;VENTOLIN HFA) 108 (90 BASE) MCG/ACT inhaler Inhale into the lungs every 6 (six) hours as needed for wheezing or shortness of breath.    Marland Kitchen amLODipine (NORVASC) 10 MG tablet Take 1 tablet (10 mg total) by mouth daily. 30 tablet 2  . cholecalciferol (VITAMIN D) 1000 UNITS tablet Take 1,000 Units by mouth daily.    . Fluticasone-Salmeterol (ADVAIR) 250-50 MCG/DOSE AEPB Inhale 1 puff into the lungs 2 (two) times daily.    . folic acid (FOLVITE) 1 MG tablet Take 1 tablet (1 mg total) by mouth daily. 30 tablet 2  . hydrALAZINE (APRESOLINE) 100 MG tablet Take 1 tablet (100 mg total) by mouth every 8 (eight) hours. 90 tablet 2  . labetalol (NORMODYNE) 200 MG tablet Take 2 tablets (400 mg total) by mouth 3 (three) times daily. 90 tablet 1  . losartan (COZAAR) 100 MG tablet Take 100 mg by mouth daily.    . Multiple Vitamin (MULTIVITAMIN WITH MINERALS) TABS tablet Take 1 tablet by mouth daily.    Marland Kitchen omega-3 acid ethyl esters (LOVAZA) 1 G capsule Take by mouth 2 (two) times daily.    . tamsulosin (FLOMAX) 0.4 MG CAPS capsule Take 1 capsule (0.4 mg total) by mouth daily after breakfast. 30 capsule 0  . thiamine 100 MG tablet Take 1 tablet (100 mg total) by mouth daily. 30 tablet 0   No current facility-administered medications for this visit.    REVIEW OF SYSTEMS:  [X]  denotes positive finding, [ ]  denotes negative finding Cardiac  Comments:  Chest pain or chest pressure: x   Shortness of breath upon exertion: x   Short of breath when lying flat: x   Irregular heart rhythm:        Vascular    Pain in calf, thigh, or hip brought on by ambulation:    Pain in feet at night that wakes you up from your sleep:  x   Blood clot in your veins:    Leg swelling:         Pulmonary    Oxygen at home:    Productive cough:     Wheezing:         Neurologic    Sudden weakness in arms or legs:     Sudden numbness in arms or legs:      Sudden onset of difficulty speaking or slurred speech:    Temporary loss of vision in one eye:     Problems with dizziness:  x       Gastrointestinal    Blood in stool:     Vomited blood:         Genitourinary    Burning when urinating:     Blood in urine:        Psychiatric    Major depression:         Hematologic    Bleeding problems:    Problems with blood clotting too easily:        Skin    Rashes or ulcers:        Constitutional    Fever or chills:      PHYSICAL EXAM:    Vitals:   01/09/20 1022  BP: (!) 172/132  Pulse: 86  Resp: 16  Temp: 98 F (36.7 C)  TempSrc: Temporal  SpO2: 98%  Weight: 174 lb (78.9 kg)  Height: 5\' 11"  (1.803 m)    GENERAL: The patient is a well-nourished male, in no acute distress. The vital signs are documented above. CARDIAC: There is a regular rate and rhythm.  VASCULAR:  Palpable radial and brachial pulses in bilateral upper extremities No upper extremity tissue loss PULMONARY: There is good air exchange bilaterally without wheezing or rales. ABDOMEN: Soft and non-tender with normal pitched bowel sounds.  MUSCULOSKELETAL: There are no major deformities or cyanosis. NEUROLOGIC: No focal weakness or paresthesias are detected. SKIN: There are no ulcers or rashes noted. PSYCHIATRIC: The patient has a normal affect.  DATA:   Arterial duplex shows triphasic waveforms in bilateral upper extremities with bifurcation at the axilla.  Upper extremity vein mapping shows usable cephalic vein in the left arm which is his nondominant arm including a 2.5 mm vein at the wrist that may be usable.  Assessment/Plan:  49 year old male with history of malignant hypertension and stage IV CKD that presents for permanent dialysis access evaluation.  Discussed plans for placement in the nondominant arm which would be his left arm.  He does have a high bifurcation at the axilla of the  artery and discussed this sometimes can be a limitation for fistula maturation given the size of the artery distally.  On vein mapping he does have a nice cephalic vein potentially at the wrist and discuss options of radiocephalic versus brachiocephalic in the left arm pending evaluation with ultrasound in the OR.  We discussed risks benefits including risk of bleeding, infection, failure to mature, steal syndrome, etc.  I offered to get him scheduled today but he wants to talk to Dr. Moshe Cipro next week.  I gave him information to contact hour scheduling nurse and get on the schedule when he feels appropriate to proceed.   [name], MD [facility] [place] Office: [phone]

## 2020-02-05 NOTE — Anesthesia Procedure Notes (Signed)
Procedure Name: MAC Date/Time: 02/05/2020 7:37 AM Performed by: Reece Agar, CRNA Pre-anesthesia Checklist: Patient identified, Emergency Drugs available, Suction available, Patient being monitored and Timeout performed Patient Re-evaluated:Patient Re-evaluated prior to induction Oxygen Delivery Method: Simple face mask

## 2020-02-05 NOTE — Transfer of Care (Signed)
Immediate Anesthesia Transfer of Care Note  Patient: Dylan Sweeney  Procedure(s) Performed: ARTERIOVENOUS (AV) FISTULA CREATION LEFT (Left Arm Upper)  Patient Location: PACU  Anesthesia Type:MAC  Level of Consciousness: drowsy  Airway & Oxygen Therapy: Patient Spontanous Breathing  Post-op Assessment: Report given to RN and Post -op Vital signs reviewed and stable  Post vital signs: Reviewed and stable  Last Vitals:  Vitals Value Taken Time  BP 188/95 02/05/20 0919  Temp 36.6 C 02/05/20 0919  Pulse 85 02/05/20 0919  Resp 17 02/05/20 0919  SpO2 99 % 02/05/20 0919    Last Pain:  Vitals:   02/05/20 0919  TempSrc:   PainSc: 0-No pain         Complications: No complications documented.

## 2020-02-05 NOTE — Discharge Instructions (Signed)
° °  [facility] [place] ° °Discharge Instructions ° °AV Fistula or Graft Surgery for Dialysis Access ° °Please refer to the following instructions for your post-procedure care. Your surgeon or physician assistant will discuss any changes with you. ° °Activity ° °You may drive the day following your surgery, if you are comfortable and no longer taking prescription pain medication. Resume full activity as the soreness in your incision resolves. ° °Bathing/Showering ° °You may shower after you go home. Keep your incision dry for 48 hours. Do not soak in a bathtub, hot tub, or swim until the incision heals completely. You may not shower if you have a hemodialysis catheter. ° °Incision Care ° °Clean your incision with mild soap and water after 48 hours. Pat the area dry with a clean towel. You do not need a bandage unless otherwise instructed. Do not apply any ointments or creams to your incision. You may have skin glue on your incision. Do not peel it off. It will come off on its own in about one week. Your arm may swell a bit after surgery. To reduce swelling use pillows to elevate your arm so it is above your heart. Your doctor will tell you if you need to lightly wrap your arm with an ACE bandage. ° °Diet ° °Resume your normal diet. There are not special food restrictions following this procedure. In order to heal from your surgery, it is CRITICAL to get adequate nutrition. Your body requires vitamins, minerals, and protein. Vegetables are the best source of vitamins and minerals. Vegetables also provide the perfect balance of protein. Processed food has little nutritional value, so try to avoid this. ° °Medications ° °Resume taking all of your medications. If your incision is causing pain, you may take over-the counter pain relievers such as acetaminophen (Tylenol). If you were prescribed a stronger pain medication, please be aware these medications can cause nausea and constipation. Prevent  nausea by taking the medication with a snack or meal. Avoid constipation by drinking plenty of fluids and eating foods with high amount of fiber, such as fruits, vegetables, and grains. Do not take Tylenol if you are taking prescription pain medications. ° ° ° ° °Follow up °Your surgeon may want to see you in the office following your access surgery. If so, this will be arranged at the time of your surgery. ° °Please call us immediately for any of the following conditions: ° °Increased pain, redness, drainage (pus) from your incision site °Fever of 101 degrees or higher °Severe or worsening pain at your incision site °Hand pain or numbness. ° °Reduce your risk of vascular disease: ° °Stop smoking. If you would like help, call [facility] at 1-800-QUIT-NOW (1-800-784-8669) or [facility] at 336-586-4000 ° °Manage your cholesterol °Maintain a desired weight °Control your diabetes °Keep your blood pressure down ° °Dialysis ° °It will take several weeks to several months for your new dialysis access to be ready for use. Your surgeon will determine when it is OK to use it. Your nephrologist will continue to direct your dialysis. You can continue to use your Permcath until your new access is ready for use. ° °If you have any questions, please call the office at [phone]. ° °

## 2020-02-05 NOTE — Progress Notes (Signed)
Dr. Marcie Bal aware of elevated BP.  Administered Hydralazine 10mg  and new order received for labetalol 10 mg.

## 2020-02-06 ENCOUNTER — Encounter (HOSPITAL_COMMUNITY): Payer: Self-pay | Admitting: Vascular Surgery

## 2020-02-06 NOTE — Anesthesia Postprocedure Evaluation (Signed)
Anesthesia Post Note  Patient: Dylan Sweeney  Procedure(s) Performed: ARTERIOVENOUS (AV) FISTULA CREATION LEFT (Left Arm Upper)     Patient location during evaluation: PACU Anesthesia Type: MAC Level of consciousness: awake and alert Pain management: pain level controlled Vital Signs Assessment: post-procedure vital signs reviewed and stable Respiratory status: spontaneous breathing, nonlabored ventilation, respiratory function stable and patient connected to nasal cannula oxygen Cardiovascular status: stable and blood pressure returned to baseline Postop Assessment: no apparent nausea or vomiting Anesthetic complications: no   No complications documented.  Last Vitals:  Vitals:   02/05/20 0905 02/05/20 0919  BP: (!) 170/84 (!) 188/95  Pulse: 91 85  Resp: 16 17  Temp:  36.6 C  SpO2: 99% 99%    Last Pain:  Vitals:   02/05/20 0919  TempSrc:   PainSc: 0-No pain                 Janisha Bueso S

## 2020-02-27 ENCOUNTER — Other Ambulatory Visit: Payer: Self-pay | Admitting: *Deleted

## 2020-02-27 DIAGNOSIS — I129 Hypertensive chronic kidney disease with stage 1 through stage 4 chronic kidney disease, or unspecified chronic kidney disease: Secondary | ICD-10-CM

## 2020-02-27 DIAGNOSIS — N184 Chronic kidney disease, stage 4 (severe): Secondary | ICD-10-CM

## 2020-03-12 ENCOUNTER — Ambulatory Visit (HOSPITAL_COMMUNITY): Admission: RE | Admit: 2020-03-12 | Payer: No Typology Code available for payment source | Source: Ambulatory Visit

## 2020-05-06 ENCOUNTER — Emergency Department (HOSPITAL_COMMUNITY): Payer: No Typology Code available for payment source

## 2020-05-06 ENCOUNTER — Other Ambulatory Visit: Payer: Self-pay

## 2020-05-06 ENCOUNTER — Emergency Department (HOSPITAL_COMMUNITY)
Admission: EM | Admit: 2020-05-06 | Discharge: 2020-05-06 | Disposition: A | Discharge status: Home / Self Care | Payer: No Typology Code available for payment source | Attending: Emergency Medicine | Admitting: Emergency Medicine

## 2020-05-06 DIAGNOSIS — R042 Hemoptysis: Secondary | ICD-10-CM | POA: Insufficient documentation

## 2020-05-06 DIAGNOSIS — Z5321 Procedure and treatment not carried out due to patient leaving prior to being seen by health care provider: Secondary | ICD-10-CM | POA: Diagnosis not present

## 2020-05-06 NOTE — ED Notes (Signed)
Pt called 2x for vitals  

## 2020-05-06 NOTE — ED Notes (Signed)
Pt called 2x no answer 

## 2020-05-06 NOTE — ED Triage Notes (Signed)
C/O coughing up blood, started today.

## 2021-07-22 ENCOUNTER — Inpatient Hospital Stay (HOSPITAL_COMMUNITY): Admission: RE | Admit: 2021-07-22 | Payer: No Typology Code available for payment source | Source: Ambulatory Visit

## 2021-07-23 ENCOUNTER — Encounter (HOSPITAL_COMMUNITY)
Admission: RE | Admit: 2021-07-23 | Discharge: 2021-07-23 | Disposition: A | Discharge status: Home / Self Care | Payer: No Typology Code available for payment source | Source: Ambulatory Visit | Attending: Nephrology | Admitting: Nephrology

## 2021-07-23 VITALS — BP 166/104 | HR 85 | Temp 97.3°F | Resp 20

## 2021-07-23 DIAGNOSIS — N184 Chronic kidney disease, stage 4 (severe): Secondary | ICD-10-CM | POA: Insufficient documentation

## 2021-07-23 DIAGNOSIS — I129 Hypertensive chronic kidney disease with stage 1 through stage 4 chronic kidney disease, or unspecified chronic kidney disease: Secondary | ICD-10-CM | POA: Diagnosis present

## 2021-07-23 LAB — POCT HEMOGLOBIN-HEMACUE: Hemoglobin: 9.6 g/dL — ABNORMAL LOW (ref 13.0–17.0)

## 2021-07-23 MED ORDER — EPOETIN ALFA-EPBX 10000 UNIT/ML IJ SOLN
20000.0000 [IU] | INTRAMUSCULAR | Status: DC
  Administered 2021-07-23: 20000 [IU] via SUBCUTANEOUS

## 2021-07-23 MED ORDER — EPOETIN ALFA-EPBX 10000 UNIT/ML IJ SOLN
INTRAMUSCULAR | Status: AC
  Filled 2021-07-23: qty 2

## 2021-08-05 ENCOUNTER — Encounter (HOSPITAL_COMMUNITY): Payer: No Typology Code available for payment source

## 2021-08-06 ENCOUNTER — Encounter (HOSPITAL_COMMUNITY)
Admission: RE | Admit: 2021-08-06 | Discharge: 2021-08-06 | Disposition: A | Discharge status: Home / Self Care | Payer: No Typology Code available for payment source | Source: Ambulatory Visit | Attending: Nephrology | Admitting: Nephrology

## 2021-08-06 VITALS — BP 162/101 | HR 83 | Temp 97.8°F | Resp 20

## 2021-08-06 DIAGNOSIS — I129 Hypertensive chronic kidney disease with stage 1 through stage 4 chronic kidney disease, or unspecified chronic kidney disease: Secondary | ICD-10-CM | POA: Insufficient documentation

## 2021-08-06 DIAGNOSIS — N184 Chronic kidney disease, stage 4 (severe): Secondary | ICD-10-CM | POA: Insufficient documentation

## 2021-08-06 LAB — POCT HEMOGLOBIN-HEMACUE: Hemoglobin: 9 g/dL — ABNORMAL LOW (ref 13.0–17.0)

## 2021-08-06 MED ORDER — EPOETIN ALFA-EPBX 10000 UNIT/ML IJ SOLN
20000.0000 [IU] | INTRAMUSCULAR | Status: DC
  Administered 2021-08-06: 20000 [IU] via SUBCUTANEOUS

## 2021-08-06 MED ORDER — EPOETIN ALFA-EPBX 10000 UNIT/ML IJ SOLN
INTRAMUSCULAR | Status: AC
Start: 2021-08-06 — End: 2021-08-06
  Filled 2021-08-06: qty 2

## 2021-08-20 ENCOUNTER — Encounter (HOSPITAL_COMMUNITY)
Admission: RE | Admit: 2021-08-20 | Discharge: 2021-08-20 | Disposition: A | Discharge status: Home / Self Care | Payer: No Typology Code available for payment source | Source: Ambulatory Visit | Attending: Nephrology | Admitting: Nephrology

## 2021-08-20 VITALS — BP 164/108 | HR 78

## 2021-08-20 DIAGNOSIS — I129 Hypertensive chronic kidney disease with stage 1 through stage 4 chronic kidney disease, or unspecified chronic kidney disease: Secondary | ICD-10-CM | POA: Diagnosis not present

## 2021-08-20 LAB — POCT HEMOGLOBIN-HEMACUE: Hemoglobin: 10.3 g/dL — ABNORMAL LOW (ref 13.0–17.0)

## 2021-08-20 LAB — RENAL FUNCTION PANEL
Albumin: 3.9 g/dL (ref 3.5–5.0)
Anion gap: 10 (ref 5–15)
BUN: 76 mg/dL — ABNORMAL HIGH (ref 6–20)
CO2: 18 mmol/L — ABNORMAL LOW (ref 22–32)
Calcium: 8.5 mg/dL — ABNORMAL LOW (ref 8.9–10.3)
Chloride: 112 mmol/L — ABNORMAL HIGH (ref 98–111)
Creatinine, Ser: 8.94 mg/dL — ABNORMAL HIGH (ref 0.61–1.24)
GFR, Estimated: 7 mL/min — ABNORMAL LOW (ref >60)
Glucose, Bld: 115 mg/dL — ABNORMAL HIGH (ref 70–99)
Phosphorus: 6.2 mg/dL — ABNORMAL HIGH (ref 2.5–4.6)
Potassium: 4.7 mmol/L (ref 3.5–5.1)
Sodium: 140 mmol/L (ref 135–145)

## 2021-08-20 LAB — FERRITIN: Ferritin: 307 ng/mL (ref 24–336)

## 2021-08-20 LAB — IRON AND TIBC
Iron: 45 ug/dL (ref 45–182)
Saturation Ratios: 19 % (ref 17.9–39.5)
TIBC: 235 ug/dL — ABNORMAL LOW (ref 250–450)
UIBC: 190 ug/dL

## 2021-08-20 MED ORDER — EPOETIN ALFA-EPBX 10000 UNIT/ML IJ SOLN: 20000.0000 [IU] | INTRAMUSCULAR | Status: DC

## 2021-08-20 MED ORDER — EPOETIN ALFA-EPBX 10000 UNIT/ML IJ SOLN
INTRAMUSCULAR | Status: AC
  Administered 2021-08-20: 20000 [IU] via SUBCUTANEOUS
  Filled 2021-08-20: qty 2

## 2021-08-21 LAB — PTH, INTACT AND CALCIUM
Calcium, Total (PTH): 8.4 mg/dL — ABNORMAL LOW (ref 8.7–10.2)
PTH: 201 pg/mL — ABNORMAL HIGH (ref 15–65)

## 2021-09-03 ENCOUNTER — Encounter (HOSPITAL_COMMUNITY): Payer: No Typology Code available for payment source

## 2021-09-17 ENCOUNTER — Encounter (HOSPITAL_COMMUNITY): Payer: No Typology Code available for payment source

## 2021-09-23 ENCOUNTER — Other Ambulatory Visit (HOSPITAL_COMMUNITY): Payer: Self-pay

## 2021-09-24 ENCOUNTER — Encounter (HOSPITAL_COMMUNITY)
Admission: RE | Admit: 2021-09-24 | Discharge: 2021-09-24 | Disposition: A | Discharge status: Home / Self Care | Payer: No Typology Code available for payment source | Source: Ambulatory Visit | Attending: Nephrology | Admitting: Nephrology

## 2021-09-24 VITALS — BP 145/104 | HR 83 | Temp 97.8°F | Resp 18

## 2021-09-24 DIAGNOSIS — N184 Chronic kidney disease, stage 4 (severe): Secondary | ICD-10-CM | POA: Insufficient documentation

## 2021-09-24 DIAGNOSIS — I129 Hypertensive chronic kidney disease with stage 1 through stage 4 chronic kidney disease, or unspecified chronic kidney disease: Secondary | ICD-10-CM | POA: Insufficient documentation

## 2021-09-24 LAB — RENAL FUNCTION PANEL
Albumin: 3.9 g/dL (ref 3.5–5.0)
Anion gap: 10 (ref 5–15)
BUN: 75 mg/dL — ABNORMAL HIGH (ref 6–20)
CO2: 18 mmol/L — ABNORMAL LOW (ref 22–32)
Calcium: 8.4 mg/dL — ABNORMAL LOW (ref 8.9–10.3)
Chloride: 111 mmol/L (ref 98–111)
Creatinine, Ser: 9.93 mg/dL — ABNORMAL HIGH (ref 0.61–1.24)
GFR, Estimated: 6 mL/min — ABNORMAL LOW (ref >60)
Glucose, Bld: 101 mg/dL — ABNORMAL HIGH (ref 70–99)
Phosphorus: 7.3 mg/dL — ABNORMAL HIGH (ref 2.5–4.6)
Potassium: 4.6 mmol/L (ref 3.5–5.1)
Sodium: 139 mmol/L (ref 135–145)

## 2021-09-24 LAB — IRON AND TIBC
Iron: 71 ug/dL (ref 45–182)
Saturation Ratios: 32 % (ref 17.9–39.5)
TIBC: 221 ug/dL — ABNORMAL LOW (ref 250–450)
UIBC: 150 ug/dL

## 2021-09-24 LAB — POCT HEMOGLOBIN-HEMACUE: Hemoglobin: 10.3 g/dL — ABNORMAL LOW (ref 13.0–17.0)

## 2021-09-24 LAB — FERRITIN: Ferritin: 404 ng/mL — ABNORMAL HIGH (ref 24–336)

## 2021-09-24 MED ORDER — EPOETIN ALFA-EPBX 10000 UNIT/ML IJ SOLN
INTRAMUSCULAR | Status: AC
  Filled 2021-09-24: qty 2

## 2021-09-24 MED ORDER — EPOETIN ALFA-EPBX 10000 UNIT/ML IJ SOLN
20000.0000 [IU] | INTRAMUSCULAR | Status: DC
  Administered 2021-09-24: 20000 [IU] via SUBCUTANEOUS

## 2021-09-25 LAB — PTH, INTACT AND CALCIUM
Calcium, Total (PTH): 8.4 mg/dL — ABNORMAL LOW (ref 8.7–10.2)
PTH: 260 pg/mL — ABNORMAL HIGH (ref 15–65)

## 2021-10-01 ENCOUNTER — Encounter (HOSPITAL_COMMUNITY): Payer: No Typology Code available for payment source

## 2021-10-08 ENCOUNTER — Encounter (HOSPITAL_COMMUNITY)
Admission: RE | Admit: 2021-10-08 | Discharge: 2021-10-08 | Disposition: A | Discharge status: Home / Self Care | Payer: No Typology Code available for payment source | Source: Ambulatory Visit | Attending: Nephrology | Admitting: Nephrology

## 2021-10-08 VITALS — BP 173/115 | HR 79 | Temp 98.5°F | Resp 18

## 2021-10-08 DIAGNOSIS — I129 Hypertensive chronic kidney disease with stage 1 through stage 4 chronic kidney disease, or unspecified chronic kidney disease: Secondary | ICD-10-CM | POA: Diagnosis present

## 2021-10-08 DIAGNOSIS — N184 Chronic kidney disease, stage 4 (severe): Secondary | ICD-10-CM | POA: Diagnosis present

## 2021-10-08 LAB — POCT HEMOGLOBIN-HEMACUE: Hemoglobin: 10.5 g/dL — ABNORMAL LOW (ref 13.0–17.0)

## 2021-10-08 MED ORDER — EPOETIN ALFA-EPBX 10000 UNIT/ML IJ SOLN
20000.0000 [IU] | INTRAMUSCULAR | Status: DC
  Administered 2021-10-08: 20000 [IU] via SUBCUTANEOUS

## 2021-10-08 MED ORDER — SODIUM CHLORIDE 0.9 % IV SOLN
510.0000 mg | INTRAVENOUS | Status: DC
  Administered 2021-10-08: 510 mg via INTRAVENOUS
  Filled 2021-10-08: qty 510

## 2021-10-08 MED ORDER — EPOETIN ALFA-EPBX 10000 UNIT/ML IJ SOLN
INTRAMUSCULAR | Status: AC
  Filled 2021-10-08: qty 2

## 2021-10-17 ENCOUNTER — Other Ambulatory Visit: Payer: Self-pay

## 2021-10-17 ENCOUNTER — Encounter (HOSPITAL_COMMUNITY): Payer: Self-pay

## 2021-10-17 ENCOUNTER — Emergency Department (HOSPITAL_COMMUNITY)
Admission: EM | Admit: 2021-10-17 | Discharge: 2021-10-18 | Disposition: A | Discharge status: Home / Self Care | Payer: No Typology Code available for payment source | Attending: Emergency Medicine | Admitting: Emergency Medicine

## 2021-10-17 DIAGNOSIS — R339 Retention of urine, unspecified: Secondary | ICD-10-CM | POA: Diagnosis present

## 2021-10-17 DIAGNOSIS — F4310 Post-traumatic stress disorder, unspecified: Secondary | ICD-10-CM | POA: Diagnosis not present

## 2021-10-17 DIAGNOSIS — D649 Anemia, unspecified: Secondary | ICD-10-CM | POA: Diagnosis not present

## 2021-10-17 DIAGNOSIS — I129 Hypertensive chronic kidney disease with stage 1 through stage 4 chronic kidney disease, or unspecified chronic kidney disease: Secondary | ICD-10-CM | POA: Diagnosis not present

## 2021-10-17 DIAGNOSIS — N184 Chronic kidney disease, stage 4 (severe): Secondary | ICD-10-CM | POA: Insufficient documentation

## 2021-10-17 DIAGNOSIS — J45909 Unspecified asthma, uncomplicated: Secondary | ICD-10-CM | POA: Diagnosis not present

## 2021-10-17 DIAGNOSIS — R748 Abnormal levels of other serum enzymes: Secondary | ICD-10-CM | POA: Diagnosis not present

## 2021-10-17 DIAGNOSIS — I160 Hypertensive urgency: Secondary | ICD-10-CM | POA: Diagnosis not present

## 2021-10-17 DIAGNOSIS — N185 Chronic kidney disease, stage 5: Secondary | ICD-10-CM | POA: Diagnosis not present

## 2021-10-17 DIAGNOSIS — R310 Gross hematuria: Secondary | ICD-10-CM | POA: Diagnosis not present

## 2021-10-17 LAB — BASIC METABOLIC PANEL
Anion gap: 16 — ABNORMAL HIGH (ref 5–15)
BUN: 86 mg/dL — ABNORMAL HIGH (ref 6–20)
CO2: 15 mmol/L — ABNORMAL LOW (ref 22–32)
Calcium: 7.7 mg/dL — ABNORMAL LOW (ref 8.9–10.3)
Chloride: 105 mmol/L (ref 98–111)
Creatinine, Ser: 11.06 mg/dL — ABNORMAL HIGH (ref 0.61–1.24)
GFR, Estimated: 5 mL/min — ABNORMAL LOW (ref >60)
Glucose, Bld: 101 mg/dL — ABNORMAL HIGH (ref 70–99)
Potassium: 5.1 mmol/L (ref 3.5–5.1)
Sodium: 136 mmol/L (ref 135–145)

## 2021-10-17 LAB — CBC WITH DIFFERENTIAL/PLATELET
Abs Immature Granulocytes: 0.03 10*3/uL (ref 0.00–0.07)
Basophils Absolute: 0 10*3/uL (ref 0.0–0.1)
Basophils Relative: 0 %
Eosinophils Absolute: 0.1 10*3/uL (ref 0.0–0.5)
Eosinophils Relative: 1 %
HCT: 29.7 % — ABNORMAL LOW (ref 39.0–52.0)
Hemoglobin: 9.6 g/dL — ABNORMAL LOW (ref 13.0–17.0)
Immature Granulocytes: 0 %
Lymphocytes Relative: 10 %
Lymphs Abs: 1 10*3/uL (ref 0.7–4.0)
MCH: 27.4 pg (ref 26.0–34.0)
MCHC: 32.3 g/dL (ref 30.0–36.0)
MCV: 84.9 fL (ref 80.0–100.0)
Monocytes Absolute: 0.7 10*3/uL (ref 0.1–1.0)
Monocytes Relative: 7 %
Neutro Abs: 7.9 10*3/uL — ABNORMAL HIGH (ref 1.7–7.7)
Neutrophils Relative %: 82 %
Platelets: 207 10*3/uL (ref 150–400)
RBC: 3.5 MIL/uL — ABNORMAL LOW (ref 4.22–5.81)
RDW: 16.7 % — ABNORMAL HIGH (ref 11.5–15.5)
WBC: 9.7 10*3/uL (ref 4.0–10.5)
nRBC: 0 % (ref 0.0–0.2)

## 2021-10-17 LAB — URINALYSIS, ROUTINE W REFLEX MICROSCOPIC
Bilirubin Urine: NEGATIVE
Glucose, UA: 50 mg/dL — AB
Ketones, ur: NEGATIVE mg/dL
Nitrite: NEGATIVE
Protein, ur: 30 mg/dL — AB
Specific Gravity, Urine: 1.006 (ref 1.005–1.030)
pH: 5 (ref 5.0–8.0)

## 2021-10-17 LAB — LIPASE, BLOOD: Lipase: 130 U/L — ABNORMAL HIGH (ref 11–51)

## 2021-10-17 NOTE — ED Provider Triage Note (Signed)
Emergency Medicine Provider Triage Evaluation Note  Dylan Sweeney , a 51 y.o. male  was evaluated in triage.  Pt complains of urinary retention onset 4 PM today.  Has had a history of similar symptoms in February at that time he was told it was due to an enlarged prostate and had a catheter placed at that time.  Notes lower abdominal tenderness to palpation.  No meds tried prior to arrival.  Review of Systems  Positive: As per HPI above Negative:   Physical Exam  BP (!) 161/107   Pulse 76   Temp 98.1 F (36.7 C) (Oral)   Resp 18   SpO2 99%  Gen:   Awake, no distress   Resp:  Normal effort  MSK:   Moves extremities without difficulty  Other:  Suprapubic abdominal tenderness to palpation.  Medical Decision Making  Medically screening exam initiated at 8:31 PM.  Appropriate orders placed.  [name] was informed that the remainder of the evaluation will be completed by another provider, this initial triage assessment does not replace that evaluation, and the importance of remaining in the ED until their evaluation is complete.  Work-up initiated   Zamia Tyminski A, PA-C 10/17/21 2032

## 2021-10-17 NOTE — ED Notes (Signed)
Pt able to void small amount of urine.

## 2021-10-17 NOTE — ED Triage Notes (Signed)
Reports suprapubic pain and inability to urinate since 4PM.   Hx of stg IV CKD. Also admits hx of enlarged prostate that caused urinary retention requiring foley catheter in the past.

## 2021-10-18 ENCOUNTER — Other Ambulatory Visit: Payer: Self-pay

## 2021-10-18 ENCOUNTER — Inpatient Hospital Stay (HOSPITAL_COMMUNITY)
Admission: EM | Admit: 2021-10-18 | Discharge: 2021-10-23 | DRG: 699 | Disposition: A | Discharge status: Home Health | Payer: No Typology Code available for payment source | Attending: Family Medicine | Admitting: Family Medicine

## 2021-10-18 ENCOUNTER — Encounter (HOSPITAL_COMMUNITY): Payer: Self-pay

## 2021-10-18 DIAGNOSIS — N3289 Other specified disorders of bladder: Secondary | ICD-10-CM | POA: Diagnosis present

## 2021-10-18 DIAGNOSIS — G4730 Sleep apnea, unspecified: Secondary | ICD-10-CM | POA: Diagnosis present

## 2021-10-18 DIAGNOSIS — Y846 Urinary catheterization as the cause of abnormal reaction of the patient, or of later complication, without mention of misadventure at the time of the procedure: Secondary | ICD-10-CM | POA: Diagnosis present

## 2021-10-18 DIAGNOSIS — N136 Pyonephrosis: Secondary | ICD-10-CM | POA: Diagnosis present

## 2021-10-18 DIAGNOSIS — D62 Acute posthemorrhagic anemia: Secondary | ICD-10-CM

## 2021-10-18 DIAGNOSIS — Z7951 Long term (current) use of inhaled steroids: Secondary | ICD-10-CM

## 2021-10-18 DIAGNOSIS — Z8546 Personal history of malignant neoplasm of prostate: Secondary | ICD-10-CM

## 2021-10-18 DIAGNOSIS — I160 Hypertensive urgency: Secondary | ICD-10-CM | POA: Diagnosis not present

## 2021-10-18 DIAGNOSIS — M898X9 Other specified disorders of bone, unspecified site: Secondary | ICD-10-CM | POA: Diagnosis present

## 2021-10-18 DIAGNOSIS — N19 Unspecified kidney failure: Secondary | ICD-10-CM | POA: Diagnosis present

## 2021-10-18 DIAGNOSIS — T8383XA Hemorrhage of genitourinary prosthetic devices, implants and grafts, initial encounter: Principal | ICD-10-CM | POA: Diagnosis present

## 2021-10-18 DIAGNOSIS — N401 Enlarged prostate with lower urinary tract symptoms: Secondary | ICD-10-CM | POA: Diagnosis present

## 2021-10-18 DIAGNOSIS — R310 Gross hematuria: Secondary | ICD-10-CM | POA: Diagnosis not present

## 2021-10-18 DIAGNOSIS — I739 Peripheral vascular disease, unspecified: Secondary | ICD-10-CM | POA: Diagnosis present

## 2021-10-18 DIAGNOSIS — F4310 Post-traumatic stress disorder, unspecified: Secondary | ICD-10-CM | POA: Diagnosis present

## 2021-10-18 DIAGNOSIS — Z79899 Other long term (current) drug therapy: Secondary | ICD-10-CM

## 2021-10-18 DIAGNOSIS — N185 Chronic kidney disease, stage 5: Secondary | ICD-10-CM

## 2021-10-18 DIAGNOSIS — R338 Other retention of urine: Secondary | ICD-10-CM

## 2021-10-18 DIAGNOSIS — E875 Hyperkalemia: Secondary | ICD-10-CM | POA: Diagnosis not present

## 2021-10-18 DIAGNOSIS — Z888 Allergy status to other drugs, medicaments and biological substances status: Secondary | ICD-10-CM

## 2021-10-18 DIAGNOSIS — Z87891 Personal history of nicotine dependence: Secondary | ICD-10-CM

## 2021-10-18 DIAGNOSIS — D573 Sickle-cell trait: Secondary | ICD-10-CM | POA: Diagnosis present

## 2021-10-18 DIAGNOSIS — Z87442 Personal history of urinary calculi: Secondary | ICD-10-CM

## 2021-10-18 DIAGNOSIS — Z8249 Family history of ischemic heart disease and other diseases of the circulatory system: Secondary | ICD-10-CM

## 2021-10-18 DIAGNOSIS — N390 Urinary tract infection, site not specified: Secondary | ICD-10-CM

## 2021-10-18 DIAGNOSIS — E872 Acidosis, unspecified: Secondary | ICD-10-CM | POA: Diagnosis not present

## 2021-10-18 DIAGNOSIS — I120 Hypertensive chronic kidney disease with stage 5 chronic kidney disease or end stage renal disease: Secondary | ICD-10-CM | POA: Diagnosis present

## 2021-10-18 DIAGNOSIS — N179 Acute kidney failure, unspecified: Secondary | ICD-10-CM | POA: Diagnosis present

## 2021-10-18 DIAGNOSIS — Z825 Family history of asthma and other chronic lower respiratory diseases: Secondary | ICD-10-CM

## 2021-10-18 LAB — COMPREHENSIVE METABOLIC PANEL
ALT: 18 U/L (ref 0–44)
AST: 23 U/L (ref 15–41)
Albumin: 4.2 g/dL (ref 3.5–5.0)
Alkaline Phosphatase: 54 U/L (ref 38–126)
Anion gap: 15 (ref 5–15)
BUN: 90 mg/dL — ABNORMAL HIGH (ref 6–20)
CO2: 13 mmol/L — ABNORMAL LOW (ref 22–32)
Calcium: 8.1 mg/dL — ABNORMAL LOW (ref 8.9–10.3)
Chloride: 108 mmol/L (ref 98–111)
Creatinine, Ser: 11.63 mg/dL — ABNORMAL HIGH (ref 0.61–1.24)
GFR, Estimated: 5 mL/min — ABNORMAL LOW (ref >60)
Glucose, Bld: 110 mg/dL — ABNORMAL HIGH (ref 70–99)
Potassium: 5.2 mmol/L — ABNORMAL HIGH (ref 3.5–5.1)
Sodium: 136 mmol/L (ref 135–145)
Total Bilirubin: 0.7 mg/dL (ref 0.3–1.2)
Total Protein: 7.4 g/dL (ref 6.5–8.1)

## 2021-10-18 LAB — CBC WITH DIFFERENTIAL/PLATELET
Abs Immature Granulocytes: 0.05 10*3/uL (ref 0.00–0.07)
Basophils Absolute: 0 10*3/uL (ref 0.0–0.1)
Basophils Relative: 0 %
Eosinophils Absolute: 0 10*3/uL (ref 0.0–0.5)
Eosinophils Relative: 0 %
HCT: 33.6 % — ABNORMAL LOW (ref 39.0–52.0)
Hemoglobin: 10.8 g/dL — ABNORMAL LOW (ref 13.0–17.0)
Immature Granulocytes: 1 %
Lymphocytes Relative: 7 %
Lymphs Abs: 0.7 10*3/uL (ref 0.7–4.0)
MCH: 27 pg (ref 26.0–34.0)
MCHC: 32.1 g/dL (ref 30.0–36.0)
MCV: 84 fL (ref 80.0–100.0)
Monocytes Absolute: 0.6 10*3/uL (ref 0.1–1.0)
Monocytes Relative: 6 %
Neutro Abs: 8.9 10*3/uL — ABNORMAL HIGH (ref 1.7–7.7)
Neutrophils Relative %: 86 %
Platelets: 229 10*3/uL (ref 150–400)
RBC: 4 MIL/uL — ABNORMAL LOW (ref 4.22–5.81)
RDW: 16.9 % — ABNORMAL HIGH (ref 11.5–15.5)
WBC: 10.2 10*3/uL (ref 4.0–10.5)
nRBC: 0 % (ref 0.0–0.2)

## 2021-10-18 MED ORDER — AMLODIPINE BESYLATE 10 MG PO TABS
10.0000 mg | ORAL_TABLET | Freq: Every day | ORAL | Status: DC
  Administered 2021-10-18 – 2021-10-23 (×6): 10 mg via ORAL
  Filled 2021-10-18: qty 1
  Filled 2021-10-18: qty 2
  Filled 2021-10-18 (×4): qty 1

## 2021-10-18 MED ORDER — SODIUM CHLORIDE 0.9 % IV SOLN: INTRAVENOUS | Status: DC

## 2021-10-18 MED ORDER — ALBUTEROL SULFATE (2.5 MG/3ML) 0.083% IN NEBU: 2.5000 mg | INHALATION_SOLUTION | Freq: Four times a day (QID) | RESPIRATORY_TRACT | Status: DC | PRN

## 2021-10-18 MED ORDER — SERTRALINE HCL 50 MG PO TABS: 50.0000 mg | ORAL_TABLET | Freq: Every day | ORAL | Status: DC

## 2021-10-18 MED ORDER — ALBUTEROL SULFATE HFA 108 (90 BASE) MCG/ACT IN AERS: 2.0000 | INHALATION_SPRAY | Freq: Four times a day (QID) | RESPIRATORY_TRACT | Status: DC | PRN

## 2021-10-18 MED ORDER — LOSARTAN POTASSIUM 50 MG PO TABS
100.0000 mg | ORAL_TABLET | Freq: Every day | ORAL | Status: DC
Start: 2021-10-18 — End: 2021-10-19
  Administered 2021-10-18 – 2021-10-19 (×2): 100 mg via ORAL
  Filled 2021-10-18 (×2): qty 2

## 2021-10-18 MED ORDER — SODIUM CHLORIDE 0.9 % IR SOLN
3000.0000 mL | Status: DC
  Administered 2021-10-18: 3000 mL
  Filled 2021-10-18 (×3): qty 3000

## 2021-10-18 MED ORDER — CEPHALEXIN 500 MG PO CAPS: 500.0000 mg | ORAL_CAPSULE | Freq: Two times a day (BID) | ORAL | 0 refills | Status: DC

## 2021-10-18 MED ORDER — MOMETASONE FURO-FORMOTEROL FUM 200-5 MCG/ACT IN AERO
2.0000 | INHALATION_SPRAY | Freq: Two times a day (BID) | RESPIRATORY_TRACT | Status: DC
  Administered 2021-10-19 – 2021-10-23 (×7): 2 via RESPIRATORY_TRACT
  Filled 2021-10-18: qty 8.8

## 2021-10-18 MED ORDER — HYDROCODONE-ACETAMINOPHEN 5-325 MG PO TABS
1.0000 | ORAL_TABLET | Freq: Four times a day (QID) | ORAL | Status: DC | PRN
  Administered 2021-10-18 – 2021-10-21 (×5): 1 via ORAL
  Filled 2021-10-18 (×5): qty 1

## 2021-10-18 MED ORDER — TRAZODONE HCL 100 MG PO TABS
100.0000 mg | ORAL_TABLET | Freq: Every day | ORAL | Status: DC
Start: 2021-10-18 — End: 2021-10-23
  Administered 2021-10-18 – 2021-10-22 (×5): 100 mg via ORAL
  Filled 2021-10-18: qty 1
  Filled 2021-10-18: qty 2
  Filled 2021-10-18 (×3): qty 1

## 2021-10-18 MED ORDER — FOLIC ACID 1 MG PO TABS
1.0000 mg | ORAL_TABLET | Freq: Every day | ORAL | Status: DC
  Administered 2021-10-18 – 2021-10-23 (×6): 1 mg via ORAL
  Filled 2021-10-18 (×7): qty 1

## 2021-10-18 MED ORDER — SERTRALINE HCL 50 MG PO TABS
25.0000 mg | ORAL_TABLET | Freq: Every day | ORAL | Status: DC
  Administered 2021-10-19 – 2021-10-23 (×5): 25 mg via ORAL
  Filled 2021-10-18 (×5): qty 1

## 2021-10-18 MED ORDER — HYDRALAZINE HCL 50 MG PO TABS
100.0000 mg | ORAL_TABLET | Freq: Three times a day (TID) | ORAL | Status: DC
  Administered 2021-10-19 – 2021-10-23 (×13): 100 mg via ORAL
  Filled 2021-10-18 (×13): qty 2

## 2021-10-18 MED ORDER — LABETALOL HCL 200 MG PO TABS
300.0000 mg | ORAL_TABLET | Freq: Three times a day (TID) | ORAL | Status: DC
  Administered 2021-10-18 – 2021-10-23 (×15): 300 mg via ORAL
  Filled 2021-10-18 (×4): qty 1
  Filled 2021-10-18: qty 2
  Filled 2021-10-18 (×5): qty 1
  Filled 2021-10-18: qty 2
  Filled 2021-10-18 (×4): qty 1

## 2021-10-18 MED ORDER — OXYBUTYNIN CHLORIDE ER 5 MG PO TB24
5.0000 mg | ORAL_TABLET | Freq: Every day | ORAL | Status: DC
  Administered 2021-10-19 – 2021-10-22 (×5): 5 mg via ORAL
  Filled 2021-10-18 (×8): qty 1

## 2021-10-18 MED ORDER — CALCITRIOL 0.25 MCG PO CAPS
0.2500 ug | ORAL_CAPSULE | Freq: Every day | ORAL | Status: DC
  Administered 2021-10-18 – 2021-10-23 (×6): 0.25 ug via ORAL
  Filled 2021-10-18 (×6): qty 1

## 2021-10-18 MED ORDER — TAMSULOSIN HCL 0.4 MG PO CAPS
0.8000 mg | ORAL_CAPSULE | Freq: Every day | ORAL | Status: DC
  Administered 2021-10-18 – 2021-10-22 (×5): 0.8 mg via ORAL
  Filled 2021-10-18 (×5): qty 2

## 2021-10-18 MED ORDER — CEPHALEXIN 250 MG PO CAPS
500.0000 mg | ORAL_CAPSULE | Freq: Two times a day (BID) | ORAL | Status: DC
  Administered 2021-10-18 – 2021-10-19 (×3): 500 mg via ORAL
  Filled 2021-10-18 (×3): qty 2

## 2021-10-18 MED ORDER — HYDROCODONE-ACETAMINOPHEN 5-325 MG PO TABS
1.0000 | ORAL_TABLET | Freq: Once | ORAL | Status: AC
  Administered 2021-10-18: 1 via ORAL
  Filled 2021-10-18: qty 1

## 2021-10-18 MED ORDER — LABETALOL HCL 5 MG/ML IV SOLN
10.0000 mg | INTRAVENOUS | Status: DC | PRN
  Administered 2021-10-19: 10 mg via INTRAVENOUS
  Filled 2021-10-18: qty 4

## 2021-10-18 MED ORDER — HYDROMORPHONE HCL 1 MG/ML IJ SOLN
1.0000 mg | Freq: Once | INTRAMUSCULAR | Status: AC
  Administered 2021-10-18: 1 mg via INTRAVENOUS
  Filled 2021-10-18: qty 1

## 2021-10-18 NOTE — Assessment & Plan Note (Addendum)
Patient previously prescribed Sertraline for which he listed as discontinued. Patient to follow-up with PCP.

## 2021-10-18 NOTE — Assessment & Plan Note (Addendum)
Continue amlodipine, hydralazine and labetalol. ARB held secondary to AKI. Blood pressure better controlled.

## 2021-10-18 NOTE — Assessment & Plan Note (Addendum)
Related to foley catheter insertion. Urology consulted. Patient underwent continuous bladder irrigation. Associated anemia. Hematuria improved. Hemoglobin stable. Discharge with foley catheter.

## 2021-10-18 NOTE — ED Provider Notes (Signed)
Emergency Department Provider Note   I have reviewed the triage vital signs and the nursing notes.   HISTORY  Chief Complaint Urinary Retention   HPI CRU Dylan Sweeney is a 51 y.o. male with past medical history reviewed below including BPH and prior urinary retention requiring a Foley catheter presents to the emergency department with inability to urinate.  Patient tells me that he typically urinates without difficulty and has been taking his home medications.  He is followed by the [facility] and sees a urologist regularly.  He states today he was unable to urinate much at all.  He has some episodes of dribbling urination.  He can urinate somewhat more when he bears down and really forces urine out but this is uncomfortable.  He is not having fevers or chills.  No blood in the urine that he can see.  He has chronic kidney disease and is nearing dialysis.   Past Medical History:  Diagnosis Date   Asthma    Depression    PTSD   Enlarged heart    History of kidney stones    in kidneys - no problems per patient 02/02/20   Hypertension    Peripheral vascular disease ([place])    Renal disorder    stage 4   Renal insufficiency    Sickle cell anemia ([place])    Has the trait-per patient (03/12/15)   Sickle cell trait ([place])    Sleep apnea    Per pt on 02/02/20 - uses 2-3 x week as needed when patient can not sleep    Review of Systems  Constitutional: No fever/chills Eyes: No visual changes. ENT: No sore throat. Cardiovascular: Denies chest pain. Respiratory: Denies shortness of breath. Gastrointestinal: No abdominal pain.  No nausea, no vomiting.  No diarrhea.  No constipation. Genitourinary: Negative for dysuria. Positive urine hesitancy and retention.  Musculoskeletal: Negative for back pain. Skin: Negative for rash. Neurological: Negative for headaches, focal weakness or numbness.   ____________________________________________   PHYSICAL EXAM:  VITAL SIGNS: ED Triage Vitals   Enc Vitals Group     BP 10/17/21 2009 (!) 161/107     Pulse Rate 10/17/21 2009 76     Resp 10/17/21 2009 18     Temp 10/17/21 2009 98.1 F (36.7 C)     Temp Source 10/17/21 2009 Oral     SpO2 10/17/21 2009 99 %     Weight 10/17/21 2033 180 lb (81.6 kg)     Height 10/17/21 2033 5\' 11"  (1.803 m)   Constitutional: Alert and oriented. Patient appears slightly uncomfortable but able to provide a full history.  Eyes: Conjunctivae are normal.  Head: Atraumatic. Nose: No congestion/rhinnorhea. Mouth/Throat: Mucous membranes are moist.   Neck: No stridor.   Cardiovascular: Normal rate, regular rhythm. Good peripheral circulation. Grossly normal heart sounds.   Respiratory: Normal respiratory effort.  No retractions. Lungs CTAB. Gastrointestinal: Soft with suprapubic tenderness. No distention.  Musculoskeletal: No lower extremity tenderness nor edema. No gross deformities of extremities. Neurologic:  Normal speech and language.  Skin:  Skin is warm, dry and intact. No rash noted.   ____________________________________________   LABS (all labs ordered are listed, but only abnormal results are displayed)  Labs Reviewed  CBC WITH DIFFERENTIAL/PLATELET - Abnormal; Notable for the following components:      Result Value   RBC 3.50 (*)    Hemoglobin 9.6 (*)    HCT 29.7 (*)    RDW 16.7 (*)    Neutro Abs 7.9 (*)  All other components within normal limits  BASIC METABOLIC PANEL - Abnormal; Notable for the following components:   CO2 15 (*)    Glucose, Bld 101 (*)    BUN 86 (*)    Creatinine, Ser 11.06 (*)    Calcium 7.7 (*)    GFR, Estimated 5 (*)    Anion gap 16 (*)    All other components within normal limits  LIPASE, BLOOD - Abnormal; Notable for the following components:   Lipase 130 (*)    All other components within normal limits  URINALYSIS, ROUTINE W REFLEX MICROSCOPIC - Abnormal; Notable for the following components:   APPearance HAZY (*)    Glucose, UA 50 (*)    Hgb  urine dipstick SMALL (*)    Protein, ur 30 (*)    Leukocytes,Ua MODERATE (*)    Bacteria, UA RARE (*)    All other components within normal limits  URINE CULTURE    ____________________________________________   PROCEDURES  Procedure(s) performed:   Procedures  None  ____________________________________________   INITIAL IMPRESSION / ASSESSMENT AND PLAN / ED COURSE  Pertinent labs & imaging results that were available during my care of the patient were reviewed by me and considered in my medical decision making (see chart for details).   This patient is Presenting for Evaluation of abdominal pain, which does require a range of treatment options, and is a complaint that involves a high risk of morbidity and mortality.  The Differential Diagnoses includes but is not exclusive to urinary retention, acute appendicitis, renal colic, testicular torsion, urinary tract infection, prostatitis,  diverticulitis, small bowel obstruction, colitis, abdominal aortic aneurysm, gastroenteritis, constipation etc.   I did obtain Additional Historical Information from wife at bedside.    Clinical Laboratory Tests Ordered, included patient with severe CKD with GFR 5.  This is slightly worse although has other lab values in similar range.  He notes that he is nearing dialysis.  He has other metabolic derangement likely related to his underlying kidney issue.  Mild anemia, also suspect related to kidney issues.  Lipase mildly elevated at 130 but no abdominal tenderness in the upper quadrants or vomiting.   Social Determinants of Health Risk patient is a former smoker.   Medical Decision Making: Summary:  Patient presents emergency department with acute urinary retention.  On my assessment the patient has been in the waiting room for around 6-1/2 hours.  He was initially offered a Foley catheter but had been having some dribbling and occasional urination and deferred that.  I performed a bedside  ultrasound which shows significant bladder distention.  He is able to have some dribbling urination when he stands and forces urination but appears to be nearing complete obstruction.  UA equivocal with rare bacteria and moderate leukocytes.  Suspect BPH and advised that we place a Foley catheter in the ED to keep in place until he can follow with his Urologist at the [place].  Reevaluation with update and discussion with patient. Ultimately consented to foley placement. >1000 mL returned. Patient's abdomen is soft and non-tender. Plan for outpatient Urology for void trial. Will start on keflex with equivocal UA and send for culture.   Disposition: discharge  ____________________________________________  FINAL CLINICAL IMPRESSION(S) / ED DIAGNOSES  Final diagnoses:  Urinary retention     NEW OUTPATIENT MEDICATIONS STARTED DURING THIS VISIT:  Current Discharge Medication List     START taking these medications   Details  cephALEXin (KEFLEX) 500 MG capsule Take 1 capsule (  500 mg total) by mouth 2 (two) times daily for 7 days. Qty: 14 capsule, Refills: 0        Note:  This document was prepared using Dragon voice recognition software and may include unintentional dictation errors.  Nanda Quinton, MD, [facility] Emergency Medicine    Shaterrica Territo, Wonda Olds, MD 10/18/21 [phone]

## 2021-10-18 NOTE — ED Notes (Signed)
Reports voiding independently several times. Requesting to hold off on cath.

## 2021-10-18 NOTE — ED Triage Notes (Signed)
Patient had foley placed last night for retention and this am bag full of blood and increased pain. Denies any known trauma during the night

## 2021-10-18 NOTE — ED Provider Notes (Signed)
[facility] EMERGENCY DEPARTMENT Provider Note   CSN: [id number] Arrival date & time: 10/18/21  1002     History  Chief Complaint  Patient presents with   Hematuria    Dylan Sweeney is a 51 y.o. male.  51 year old male with a past medical history of CKD, resents to the ED with a chief complaint of hematuria.  Patient had a catheter placed in the ED yesterday after retaining over 1000 mL in his bladder, he reports this morning he began to void only blood.  He is endorsing pain along the tip of the penis, he does report there was some blood present at the time of the insertion however this later resolved.  Patient reports no similar episodes in the past.  He is followed closely by urology at the [facility] for recurrent issues with his prostate.  Scheduled to have prostate surgery sometime in the near future.No fever, currently on no blood thinners.   The history is provided by the patient and medical records.  Hematuria Pertinent negatives include no chest pain, no abdominal pain and no shortness of breath.       Home Medications Prior to Admission medications   Medication Sig Start Date End Date Taking? Authorizing Provider  albuterol (PROVENTIL HFA;VENTOLIN HFA) 108 (90 BASE) MCG/ACT inhaler Inhale 2 puffs into the lungs every 6 (six) hours as needed for wheezing or shortness of breath.     [provider]  amLODipine (NORVASC) 10 MG tablet Take 1 tablet (10 mg total) by mouth daily. 03/19/15   Reyne Dumas, MD  calcitRIOL (ROCALTROL) 0.25 MCG capsule Take 0.25 mcg by mouth daily.    [provider]  cephALEXin (KEFLEX) 500 MG capsule Take 1 capsule (500 mg total) by mouth 2 (two) times daily for 7 days. 10/18/21 10/25/21  Long, Wonda Olds, MD  Cholecalciferol (VITAMIN D3) 1.25 MG (50000 UT) CAPS Take 50,000 mcg by mouth once a week.    [provider]  Fluticasone-Salmeterol (ADVAIR) 250-50 MCG/DOSE AEPB Inhale 1 puff into the lungs daily as  needed (shortness of breath).     [provider]  folic acid (FOLVITE) 1 MG tablet Take 1 tablet (1 mg total) by mouth daily. 03/19/15   Reyne Dumas, MD  hydrALAZINE (APRESOLINE) 100 MG tablet Take 1 tablet (100 mg total) by mouth every 8 (eight) hours. Patient taking differently: Take 100 mg by mouth 3 (three) times daily.  03/19/15   Reyne Dumas, MD  labetalol (NORMODYNE) 300 MG tablet Take 300 mg by mouth 3 (three) times daily.    [provider]  losartan (COZAAR) 100 MG tablet Take 100 mg by mouth daily.    [provider]  SERTRALINE HCL PO daily.     [provider]  tamsulosin (FLOMAX) 0.4 MG CAPS capsule Take 1 capsule (0.4 mg total) by mouth daily after breakfast. Patient taking differently: Take 0.8 mg by mouth at bedtime.  09/24/18   Hosie Poisson, MD  TraZODone & Diet Manage Prod (TRAZAMINE PO) Take by mouth at bedtime.    [provider]      Allergies    Lisinopril and Other    Review of Systems   Review of Systems  Constitutional:  Negative for chills and fever.  Respiratory:  Negative for shortness of breath.   Cardiovascular:  Negative for chest pain.  Gastrointestinal:  Negative for abdominal pain and nausea.  Genitourinary:  Positive for hematuria.  All other systems reviewed and are negative.  Physical Exam Updated Vital Signs BP (!) 187/106 (BP Location: Right Arm)   Pulse 92   Temp 97.6 F (36.4 C) (Oral)   Resp 16   SpO2 98%  Physical Exam Vitals and nursing note reviewed.  Constitutional:      Appearance: Normal appearance.  HENT:     Head: Normocephalic and atraumatic.     Mouth/Throat:     Mouth: Mucous membranes are moist.  Eyes:     Pupils: Pupils are equal, round, and reactive to light.  Cardiovascular:     Rate and Rhythm: Normal rate.  Pulmonary:     Effort: Pulmonary effort is normal.  Abdominal:     General: Abdomen is flat.  Genitourinary:    Comments: Large bag of blood filled no  clots present.  Musculoskeletal:     Cervical back: Normal range of motion and neck supple.  Skin:    General: Skin is warm and dry.  Neurological:     Mental Status: He is alert and oriented to person, place, and time.     ED Results / Procedures / Treatments   Labs (all labs ordered are listed, but only abnormal results are displayed) Labs Reviewed  CBC WITH DIFFERENTIAL/PLATELET - Abnormal; Notable for the following components:      Result Value   RBC 4.00 (*)    Hemoglobin 10.8 (*)    HCT 33.6 (*)    RDW 16.9 (*)    Neutro Abs 8.9 (*)    All other components within normal limits  COMPREHENSIVE METABOLIC PANEL - Abnormal; Notable for the following components:   Potassium 5.2 (*)    CO2 13 (*)    Glucose, Bld 110 (*)    BUN 90 (*)    Creatinine, Ser 11.63 (*)    Calcium 8.1 (*)    GFR, Estimated 5 (*)    All other components within normal limits    EKG None  Radiology No results found.  Procedures Procedures    Medications Ordered in ED Medications  HYDROcodone-acetaminophen (NORCO/VICODIN) 5-325 MG per tablet 1 tablet (1 tablet Oral Given 10/18/21 1047)  HYDROmorphone (DILAUDID) injection 1 mg (1 mg Intravenous Given 10/18/21 1321)    ED Course/ Medical Decision Making/ A&P                           Medical Decision Making Amount and/or Complexity of Data Reviewed Labs: ordered.  Risk Prescription drug management.    This patient presents to the ED for concern of hematuria, this involves a number of treatment options, and is a complaint that carries with it a high risk of complications and morbidity.  The differential diagnosis includes infection, traumatic catheter insertion.    Co morbidities: Discussed in HPI   Brief History:    EMR reviewed including pt PMHx, past surgical history and past visits to ER.   See HPI for more details   Lab Tests:  I ordered and independently interpreted labs.  The pertinent results include:    I  personally reviewed all laboratory work and imaging. Metabolic panel without any acute abnormality specifically kidney function within normal limits and no significant electrolyte abnormalities. CBC without leukocytosis or significant anemia.  His hemoglobin was rechecked per urology and is improved today at 10.8, creatinine continues to worsen but patient does not receive dialysis currently.  Potassium slightly elevated   Imaging Studies:  N/A   Medicines ordered:  I ordered medication including norco  for pain control Reevaluation of the patient after these medicines showed that the patient improved I have reviewed the patients home medicines and have made adjustments as needed   Critical Interventions:  Had Foley catheter irrigated, flush which is currently working adequately.   Consults:  10:47 PM I requested consultation with Dr. Gloriann Loan,  and discussed lab and imaging findings as well as pertinent plan - they recommend: Continued to push fluids, likely traumatic due to prior catheter placement.  We will just need to be flushed along with followed up closely outpatient.  They did recommend a recheck of hemoglobin.    Reevaluation:  After the interventions noted above I re-evaluated patient and found that they have :stayed the same   Social Determinants of Health:  Social work/case management involved    Problem List / ED Course:  Patient here with gross hematuria, had a Foley placed approximately 5 hours prior to discharge from the ED.  Return since he had multiple bags of gross hematuria.  Hemoglobin is stable, evaluated by Dr. Gloriann Loan of urology who recommended continue bladder irrigation.  Patient's creatinine continues to worsen, although currently not on dialysis may need to reevaluate this at this time.  Patient is otherwise hemodynamically stable.   Dispostion:  After consideration of the diagnostic results and the patients response to treatment, I feel that the  patent would benefit from admission.    Spoke to hospitalist service, appreciate their help. Patient admitted.    Portions of this note were generated with Lobbyist. Dictation errors may occur despite best attempts at proofreading.   Final Clinical Impression(s) / ED Diagnoses Final diagnoses:  Gross hematuria    Rx / DC Orders ED Discharge Orders     None         Janeece Fitting, Hershal Coria 10/18/21 1330    Luna Fuse, MD 10/20/21 1704

## 2021-10-18 NOTE — Subjective & Objective (Addendum)
Dylan Sweeney, a 51 y/o, with hard to control HTN, cardiomyopathy, PTSD, CKD 5, BPH followed by Urology at the [facility] and due for surgical intervention. [facility] also follows CKD 5, - approaching dialysis although he has been making urine in reasonable volumes. He has had vacular access created with fistula proximal left UE. He presented to [facility] 10/17/21 for decrease UOP and was found to be in retention. With placement of Foley catheter 1,000 urine output and he was discharged home with Foley in Place. At home he began having gross hematuria. He returns to [facility] for evaluation of this problem.

## 2021-10-18 NOTE — H&P (Signed)
History and Physical    Dylan Sweeney:[id number] DOB: 1971-03-16 DOA: 10/18/2021  DOS: the patient was seen and examined on 10/18/2021  PCP: Haivana Nakya   Patient coming from: Home  I have personally briefly reviewed patient's old medical records in [employer]  Mr. Dylan Sweeney, a 51 y/o, with hard to control HTN, cardiomyopathy, PTSD, CKD 5, BPH followed by Urology at the [facility] and due for surgical intervention. [facility] 5, - approaching dialysis although he has been making urine in reasonable volumes. He has had vacular access created with fistula proximal left UE. He presented to [facility] 10/17/21 for decrease UOP and was found to be in retention. With placement of Foley catheter 1,000 urine output and he was discharged home with Foley in Place. At home he began having gross hematuria. He returns to [facility] for evaluation of this problem.    ED Course: Pt with marked hypertension  187/106. Lab: K 5.2, Glucose 110, BUN 90, Cr 11.63. Hgb 9.6 to 10.8. U/A with moderate LE, rare bacteria. He had irrigation done of bladder via Foley but continued to have gross hematuria. Dr. Gloriann Loan, on call for urology, consulted and recommended [facility] admit patient for continuous bladder irrigation and medical management.   Review of Systems:  Review of Systems  Constitutional: Negative.   HENT: Negative.    Eyes: Negative.   Respiratory: Negative.    Cardiovascular:  Negative for chest pain, orthopnea and leg swelling.  Gastrointestinal: Negative.   Genitourinary:  Positive for hematuria.  Musculoskeletal: Negative.   Skin: Negative.   Neurological: Negative.   Endo/Heme/Allergies: Negative.   Psychiatric/Behavioral: Negative.      Past Medical History:  Diagnosis Date   Asthma    Depression    PTSD   Enlarged heart    History of kidney stones    in kidneys - no problems per patient 02/02/20   Hypertension    Peripheral vascular disease ([place])    Renal disorder    stage 4   Renal  insufficiency    Sickle cell anemia (HCC)    Has the trait-per patient (03/12/15)   Sickle cell trait ([place])    Sleep apnea    Per pt on 02/02/20 - uses 2-3 x week as needed when patient can not sleep    Past Surgical History:  Procedure Laterality Date   AV FISTULA PLACEMENT Left 02/05/2020   Procedure: ARTERIOVENOUS (AV) FISTULA CREATION LEFT;  Surgeon: [name], MD;  Location: [facility];  Service: Vascular;  Laterality: Left;   NO PAST SURGERIES      Soc Hx - 1st marriage 5 years - divorce. 2nd marriage 36 years - he has a son 69, attending [employer] and a daughter 17. Her served in the [employer] 17 years, serving three tours war zone and discharged on full medical retirement. He is retired plus SSI. Spouse is very supportive.    reports that he has quit smoking. His smoking use included cigarettes. He has never used smokeless tobacco. He reports current alcohol use of about 6.0 standard drinks of alcohol per week. He reports current drug use. Drug: Marijuana.  Allergies  Allergen Reactions   Lisinopril Other (See Comments)    Couldn't stop coughing   Other Other (See Comments)    G6 PD pill allergy: pill given [place] travel [place]    Family History  Problem Relation Age of Onset   Heart disease Mother    Hypertension Sister  Asthma Sister    Hypertension Brother     Prior to Admission medications   Medication Sig Start Date End Date Taking? Authorizing Provider  albuterol (PROVENTIL HFA;VENTOLIN HFA) 108 (90 BASE) MCG/ACT inhaler Inhale 2 puffs into the lungs every 6 (six) hours as needed for wheezing or shortness of breath.     [provider]  amLODipine (NORVASC) 10 MG tablet Take 1 tablet (10 mg total) by mouth daily. 03/19/15   Reyne Dumas, MD  calcitRIOL (ROCALTROL) 0.25 MCG capsule Take 0.25 mcg by mouth daily.    [provider]  cephALEXin (KEFLEX) 500 MG capsule Take 1 capsule (500 mg total) by mouth 2 (two) times daily for 7 days. 10/18/21  10/25/21  Long, Wonda Olds, MD  Cholecalciferol (VITAMIN D3) 1.25 MG (50000 UT) CAPS Take 50,000 mcg by mouth once a week.    [provider]  Fluticasone-Salmeterol (ADVAIR) 250-50 MCG/DOSE AEPB Inhale 1 puff into the lungs daily as needed (shortness of breath).     [provider]  folic acid (FOLVITE) 1 MG tablet Take 1 tablet (1 mg total) by mouth daily. 03/19/15   Reyne Dumas, MD  hydrALAZINE (APRESOLINE) 100 MG tablet Take 1 tablet (100 mg total) by mouth every 8 (eight) hours. Patient taking differently: Take 100 mg by mouth 3 (three) times daily.  03/19/15   Reyne Dumas, MD  labetalol (NORMODYNE) 300 MG tablet Take 300 mg by mouth 3 (three) times daily.    [provider]  losartan (COZAAR) 100 MG tablet Take 100 mg by mouth daily.    [provider]  SERTRALINE HCL PO daily.     [provider]  tamsulosin (FLOMAX) 0.4 MG CAPS capsule Take 1 capsule (0.4 mg total) by mouth daily after breakfast. Patient taking differently: Take 0.8 mg by mouth at bedtime.  09/24/18   Hosie Poisson, MD  TraZODone & Diet Manage Prod (TRAZAMINE PO) Take by mouth at bedtime.    [provider]    Physical Exam: Vitals:   10/18/21 1005  BP: (!) 187/106  Pulse: 92  Resp: 16  Temp: 97.6 F (36.4 C)  TempSrc: Oral  SpO2: 98%    Physical Exam Vitals and nursing note reviewed.  Constitutional:      General: He is not in acute distress.    Appearance: Normal appearance. He is normal weight. He is not ill-appearing.  HENT:     Head: Normocephalic and atraumatic.     Nose: Nose normal.     Mouth/Throat:     Mouth: Mucous membranes are moist.     Pharynx: Oropharynx is clear.  Eyes:     Extraocular Movements: Extraocular movements intact.     Pupils: Pupils are equal, round, and reactive to light.     Comments: Pterygium OD medial aspect - not encroaching on the [place].   Neck:     Vascular: No carotid bruit.  Cardiovascular:     Rate and  Rhythm: Regular rhythm.     Pulses: Normal pulses.     Heart sounds: Murmur heard.     Comments: Soft blowing systolic mm best at LSB.  Vascular fistula proximal Left UE- good thrill. Abdominal:     General: Abdomen is flat. Bowel sounds are normal.     Palpations: Abdomen is soft.  Genitourinary:    Comments: Foley in place. Exam deferred to Urology Musculoskeletal:        General: No swelling or deformity. Normal range of motion.  Cervical back: Normal range of motion and neck supple. No rigidity.     Right lower leg: No edema.     Left lower leg: No edema.  Skin:    General: Skin is warm and dry.  Neurological:     General: No focal deficit present.     Mental Status: He is alert and oriented to person, place, and time. Mental status is at baseline.  Psychiatric:        Mood and Affect: Mood normal.        Behavior: Behavior normal.      Labs on Admission: I have personally reviewed following labs and imaging studies  CBC: Recent Labs  Lab 10/17/21 2039 10/18/21 1110  WBC 9.7 10.2  NEUTROABS 7.9* 8.9*  HGB 9.6* 10.8*  HCT 29.7* 33.6*  MCV 84.9 84.0  PLT 207 790   Basic Metabolic Panel: Recent Labs  Lab 10/17/21 2039 10/18/21 1110  NA 136 136  K 5.1 5.2*  CL 105 108  CO2 15* 13*  GLUCOSE 101* 110*  BUN 86* 90*  CREATININE 11.06* 11.63*  CALCIUM 7.7* 8.1*   GFR: Estimated Creatinine Clearance: 7.9 mL/min (A) (by C-G formula based on SCr of 11.63 mg/dL (H)). Liver Function Tests: Recent Labs  Lab 10/18/21 1110  AST 23  ALT 18  ALKPHOS 54  BILITOT 0.7  PROT 7.4  ALBUMIN 4.2   Recent Labs  Lab 10/17/21 2039  LIPASE 130*   No results for input(s): "AMMONIA" in the last 168 hours. Coagulation Profile: No results for input(s): "INR", "PROTIME" in the last 168 hours. Cardiac Enzymes: No results for input(s): "CKTOTAL", "CKMB", "CKMBINDEX", "TROPONINI" in the last 168 hours. BNP (last 3 results) No results for input(s): "PROBNP" in the last  8760 hours. HbA1C: No results for input(s): "HGBA1C" in the last 72 hours. CBG: No results for input(s): "GLUCAP" in the last 168 hours. Lipid Profile: No results for input(s): "CHOL", "HDL", "LDLCALC", "TRIG", "CHOLHDL", "LDLDIRECT" in the last 72 hours. Thyroid Function Tests: No results for input(s): "TSH", "T4TOTAL", "FREET4", "T3FREE", "THYROIDAB" in the last 72 hours. Anemia Panel: No results for input(s): "VITAMINB12", "FOLATE", "FERRITIN", "TIBC", "IRON", "RETICCTPCT" in the last 72 hours. Urine analysis:    Component Value Date/Time   COLORURINE YELLOW 10/17/2021 2110   APPEARANCEUR HAZY (A) 10/17/2021 2110   LABSPEC 1.006 10/17/2021 2110   PHURINE 5.0 10/17/2021 2110   GLUCOSEU 50 (A) 10/17/2021 2110   HGBUR SMALL (A) 10/17/2021 2110   BILIRUBINUR NEGATIVE 10/17/2021 2110   KETONESUR NEGATIVE 10/17/2021 2110   PROTEINUR 30 (A) 10/17/2021 2110   UROBILINOGEN 1.0 03/15/2015 1034   NITRITE NEGATIVE 10/17/2021 2110   LEUKOCYTESUR MODERATE (A) 10/17/2021 2110    Radiological Exams on Admission: I have personally reviewed images No results found.  EKG: I have personally reviewed EKG: no EKG on chart  Assessment/Plan Active Problems:   Hypertensive urgency   Persistent gross hematuria   CKD (chronic kidney disease) stage 5, GFR less than 15 ml/min (HCC)   PTSD (post-traumatic stress disorder)    Assessment and Plan: Persistent gross hematuria New problem following placement of foley catheter. At time of admission Hgb is stable  Plan Med-surg obs  Continuous bladder irrigation - urology following  H/H q 12  Hypertensive urgency Patient with long standing difficult to control HTN - on multiple meds. Reports his BP usually runs 140/90.  Plan Continue all home meds  Labetolol IV for SBP >180, DBP > 100  CKD (chronic kidney disease)  stage 5, GFR less than 15 ml/min (HCC) [facility] Moshe Cipro. Has vascular access left UE -  fistula, in place for a year. Currently stable. No need for any intervention  PTSD (post-traumatic stress disorder) Long standing problem related to [employer] and managed by [facility] home meds.       DVT prophylaxis:  TED stockings Code Status: Full Code Family Communication: wife present during interview and exam  Disposition Plan: home when stable  Consults called: Urology - Dr. Gloriann Loan - has seen patient  Admission status: Observation, Med-Surg   Adella Hare, MD [facility] 10/18/2021, 2:37 PM

## 2021-10-18 NOTE — Assessment & Plan Note (Addendum)
CKD stage 5.  Hyperkalemia.  Bilateral hydronephrosis due to bladder pathology.    Today renal function with a stable serum cr at 12.0 with K at 4,1 and serum bicarbonate at 16.  P is 8,1.   Plan to continue close monitoring of renal function in am. Continue sodium bicarbonate oral and will add phosphate binders.  Patient will need a close follow up with nephrology, he does have a vascular access (AV fistula) ready to use in his left upper extremity.  Today with no signs of volume overload, and no criteria for urgent renal replacement therapy.   Anemia of chronic disease continue with EPO.

## 2021-10-18 NOTE — Progress Notes (Signed)
TRH night cross cover note:   I was notified by RN that the patient needs a bed request.   Per my chart review, including review of Hospitalist H&P, this is a 51 year old male who was admitted earlier today to the hospitalist service for further evaluation and management of presenting gross hematuria as well as hypertensive urgency.  Per H&P, patient to be admitted for observation to the medical floor.  Consistent with this, I subsequently placed admission order/bed request for observation to med telemetry, as patient is on prn IV labetalol.    Babs Bertin, DO Hospitalist

## 2021-10-18 NOTE — Discharge Instructions (Signed)
You were seen in the emergency room today with inability to urinate.  We have placed a Foley catheter which will stay in until your urologist at the [facility] is able to remove it.  Please call the office on Monday to schedule a follow-up appointment in the next 1 to 2 weeks.  Starting on antibiotics for the next 7 days.  Please take as directed.  Return with any new or suddenly worsening symptoms.

## 2021-10-18 NOTE — Consult Note (Addendum)
H&P Physician requesting consult: Nanda Quinton  Chief Complaint: Gross hematuria  History of Present Illness: 51 year old male with a history of BPH and prior urinary retention requiring Foley catheter presented to the emergency department last night with urinary retention.  He is followed by the [facility] and has a urologist.  States that he is planning for a procedural intervention for his prostate.  He does have chronic renal insufficiency stage V and is approaching the need for dialysis.  Creatinine worsening and GFR is 5.  Still makes a good amount of urine.  After Foley catheter placement, he has had gross hematuria.  This has been persistent despite manual bladder irrigation.  States that he takes Flomax and finasteride.  Past Medical History:  Diagnosis Date   Asthma    Depression    PTSD   Enlarged heart    History of kidney stones    in kidneys - no problems per patient 02/02/20   Hypertension    Peripheral vascular disease ([place])    Renal disorder    stage 4   Renal insufficiency    Sickle cell anemia (HCC)    Has the trait-per patient (03/12/15)   Sickle cell trait ([place])    Sleep apnea    Per pt on 02/02/20 - uses 2-3 x week as needed when patient can not sleep   Past Surgical History:  Procedure Laterality Date   AV FISTULA PLACEMENT Left 02/05/2020   Procedure: ARTERIOVENOUS (AV) FISTULA CREATION LEFT;  Surgeon: [name], MD;  Location: [facility];  Service: Vascular;  Laterality: Left;   NO PAST SURGERIES      Home Medications:  (Not in a hospital admission)  Allergies:  Allergies  Allergen Reactions   Lisinopril Other (See Comments)    Couldn't stop coughing   Other Other (See Comments)    G6 PD pill allergy: pill given before travel in [place]    Family History  Problem Relation Age of Onset   Heart disease Mother    Hypertension Sister    Asthma Sister    Hypertension Brother    Social History:  reports that he has quit smoking. His  smoking use included cigarettes. He has never used smokeless tobacco. He reports current alcohol use of about 6.0 standard drinks of alcohol per week. He reports current drug use. Drug: Marijuana.  ROS: A complete review of systems was performed.  All systems are negative except for pertinent findings as noted. ROS   Physical Exam:  Vital signs in last 24 hours: Temp:  [97.6 F (36.4 C)-98.3 F (36.8 C)] 97.6 F (36.4 C) (06/17 1005) Pulse Rate:  [68-92] 92 (06/17 1005) Resp:  [16-18] 16 (06/17 1005) BP: (149-187)/(99-107) 187/106 (06/17 1005) SpO2:  [98 %-100 %] 98 % (06/17 1005) Weight:  [81.6 kg] 81.6 kg (06/16 2033) General:  Alert and oriented, No acute distress HEENT: Normocephalic, atraumatic Neck: No JVD or lymphadenopathy Cardiovascular: Regular rate and rhythm Lungs: Regular rate and effort Abdomen: Soft, nontender, nondistended, no abdominal masses Genitourinary: Circumcised phallus.  25 French Foley catheter draining dark red urine Back: No CVA tenderness Extremities: No edema Neurologic: Grossly intact  Laboratory Data:  Results for orders placed or performed during the hospital encounter of 10/18/21 (from the past 24 hour(s))  CBC with Differential     Status: Abnormal   Collection Time: 10/18/21 11:10 AM  Result Value Ref Range   WBC 10.2 4.0 - 10.5 K/uL   RBC 4.00 (L) 4.22 - 5.81  MIL/uL   Hemoglobin 10.8 (L) 13.0 - 17.0 g/dL   HCT 33.6 (L) 39.0 - 52.0 %   MCV 84.0 80.0 - 100.0 fL   MCH 27.0 26.0 - 34.0 pg   MCHC 32.1 30.0 - 36.0 g/dL   RDW 16.9 (H) 11.5 - 15.5 %   Platelets 229 150 - 400 K/uL   nRBC 0.0 0.0 - 0.2 %   Neutrophils Relative % 86 %   Neutro Abs 8.9 (H) 1.7 - 7.7 K/uL   Lymphocytes Relative 7 %   Lymphs Abs 0.7 0.7 - 4.0 K/uL   Monocytes Relative 6 %   Monocytes Absolute 0.6 0.1 - 1.0 K/uL   Eosinophils Relative 0 %   Eosinophils Absolute 0.0 0.0 - 0.5 K/uL   Basophils Relative 0 %   Basophils Absolute 0.0 0.0 - 0.1 K/uL   Immature  Granulocytes 1 %   Abs Immature Granulocytes 0.05 0.00 - 0.07 K/uL  Comprehensive metabolic panel     Status: Abnormal   Collection Time: 10/18/21 11:10 AM  Result Value Ref Range   Sodium 136 135 - 145 mmol/L   Potassium 5.2 (H) 3.5 - 5.1 mmol/L   Chloride 108 98 - 111 mmol/L   CO2 13 (L) 22 - 32 mmol/L   Glucose, Bld 110 (H) 70 - 99 mg/dL   BUN 90 (H) 6 - 20 mg/dL   Creatinine, Ser 11.63 (H) 0.61 - 1.24 mg/dL   Calcium 8.1 (L) 8.9 - 10.3 mg/dL   Total Protein 7.4 6.5 - 8.1 g/dL   Albumin 4.2 3.5 - 5.0 g/dL   AST 23 15 - 41 U/L   ALT 18 0 - 44 U/L   Alkaline Phosphatase 54 38 - 126 U/L   Total Bilirubin 0.7 0.3 - 1.2 mg/dL   GFR, Estimated 5 (L) >60 mL/min   Anion gap 15 5 - 15   No results found for this or any previous visit (from the past 240 hour(s)). Creatinine: Recent Labs    10/17/21 2039 10/18/21 1110  CREATININE 11.06* 11.63*   Procedure: 16 French Foley catheter was removed.  Under sterile conditions, lidocaine jelly was instilled into the urethra.  71 French three-way Foley catheter was placed.  Bladder was irrigated with return of small amount of clot.  Continuous bladder irrigation was initiated.   Impression/Assessment:  Gross hematuria likely secondary to BPH  Plan:  We will continue continuous bladder irrigation.  Recommend admission to the hospitalist given his worsening renal function and hematuria.  Hopefully able to wean off CBI.  Continue Flomax and finasteride.  Digital rectal exam was deferred due to the patient's current discomfort.  Will need to be followed up outpatient.  Sounds like he has a urologist at the [facility] that is following his PSA and prostate exams.  Marton Redwood, III 10/18/2021, 1:08 PM

## 2021-10-19 DIAGNOSIS — Z87442 Personal history of urinary calculi: Secondary | ICD-10-CM | POA: Diagnosis not present

## 2021-10-19 DIAGNOSIS — E872 Acidosis, unspecified: Secondary | ICD-10-CM | POA: Diagnosis not present

## 2021-10-19 DIAGNOSIS — Y846 Urinary catheterization as the cause of abnormal reaction of the patient, or of later complication, without mention of misadventure at the time of the procedure: Secondary | ICD-10-CM | POA: Diagnosis present

## 2021-10-19 DIAGNOSIS — N179 Acute kidney failure, unspecified: Secondary | ICD-10-CM | POA: Diagnosis present

## 2021-10-19 DIAGNOSIS — I160 Hypertensive urgency: Secondary | ICD-10-CM | POA: Diagnosis present

## 2021-10-19 DIAGNOSIS — Z8546 Personal history of malignant neoplasm of prostate: Secondary | ICD-10-CM | POA: Diagnosis not present

## 2021-10-19 DIAGNOSIS — D573 Sickle-cell trait: Secondary | ICD-10-CM | POA: Diagnosis present

## 2021-10-19 DIAGNOSIS — N185 Chronic kidney disease, stage 5: Secondary | ICD-10-CM | POA: Diagnosis present

## 2021-10-19 DIAGNOSIS — Z888 Allergy status to other drugs, medicaments and biological substances status: Secondary | ICD-10-CM | POA: Diagnosis not present

## 2021-10-19 DIAGNOSIS — I120 Hypertensive chronic kidney disease with stage 5 chronic kidney disease or end stage renal disease: Secondary | ICD-10-CM | POA: Diagnosis present

## 2021-10-19 DIAGNOSIS — Z87891 Personal history of nicotine dependence: Secondary | ICD-10-CM | POA: Diagnosis not present

## 2021-10-19 DIAGNOSIS — E875 Hyperkalemia: Secondary | ICD-10-CM | POA: Diagnosis not present

## 2021-10-19 DIAGNOSIS — D62 Acute posthemorrhagic anemia: Secondary | ICD-10-CM | POA: Diagnosis present

## 2021-10-19 DIAGNOSIS — N3289 Other specified disorders of bladder: Secondary | ICD-10-CM | POA: Diagnosis present

## 2021-10-19 DIAGNOSIS — Z79899 Other long term (current) drug therapy: Secondary | ICD-10-CM | POA: Diagnosis not present

## 2021-10-19 DIAGNOSIS — N189 Chronic kidney disease, unspecified: Secondary | ICD-10-CM

## 2021-10-19 DIAGNOSIS — T8383XA Hemorrhage of genitourinary prosthetic devices, implants and grafts, initial encounter: Secondary | ICD-10-CM | POA: Diagnosis present

## 2021-10-19 DIAGNOSIS — N401 Enlarged prostate with lower urinary tract symptoms: Secondary | ICD-10-CM | POA: Diagnosis present

## 2021-10-19 DIAGNOSIS — M898X9 Other specified disorders of bone, unspecified site: Secondary | ICD-10-CM | POA: Diagnosis present

## 2021-10-19 DIAGNOSIS — N19 Unspecified kidney failure: Secondary | ICD-10-CM | POA: Diagnosis present

## 2021-10-19 DIAGNOSIS — N136 Pyonephrosis: Secondary | ICD-10-CM | POA: Diagnosis present

## 2021-10-19 DIAGNOSIS — R338 Other retention of urine: Secondary | ICD-10-CM | POA: Diagnosis present

## 2021-10-19 DIAGNOSIS — G4730 Sleep apnea, unspecified: Secondary | ICD-10-CM | POA: Diagnosis present

## 2021-10-19 DIAGNOSIS — F4310 Post-traumatic stress disorder, unspecified: Secondary | ICD-10-CM | POA: Diagnosis present

## 2021-10-19 DIAGNOSIS — I739 Peripheral vascular disease, unspecified: Secondary | ICD-10-CM | POA: Diagnosis present

## 2021-10-19 DIAGNOSIS — R310 Gross hematuria: Secondary | ICD-10-CM | POA: Diagnosis present

## 2021-10-19 DIAGNOSIS — Z7951 Long term (current) use of inhaled steroids: Secondary | ICD-10-CM | POA: Diagnosis not present

## 2021-10-19 LAB — URINE CULTURE: Culture: NO GROWTH

## 2021-10-19 LAB — BASIC METABOLIC PANEL
Anion gap: 15 (ref 5–15)
BUN: 85 mg/dL — ABNORMAL HIGH (ref 6–20)
CO2: 11 mmol/L — ABNORMAL LOW (ref 22–32)
Calcium: 8.2 mg/dL — ABNORMAL LOW (ref 8.9–10.3)
Chloride: 108 mmol/L (ref 98–111)
Creatinine, Ser: 11.71 mg/dL — ABNORMAL HIGH (ref 0.61–1.24)
GFR, Estimated: 5 mL/min — ABNORMAL LOW (ref >60)
Glucose, Bld: 124 mg/dL — ABNORMAL HIGH (ref 70–99)
Potassium: 4.7 mmol/L (ref 3.5–5.1)
Sodium: 134 mmol/L — ABNORMAL LOW (ref 135–145)

## 2021-10-19 LAB — HEMOGLOBIN AND HEMATOCRIT, BLOOD
HCT: 29.6 % — ABNORMAL LOW (ref 39.0–52.0)
Hemoglobin: 9.7 g/dL — ABNORMAL LOW (ref 13.0–17.0)

## 2021-10-19 MED ORDER — HYDROMORPHONE HCL 1 MG/ML IJ SOLN
1.0000 mg | Freq: Four times a day (QID) | INTRAMUSCULAR | Status: DC | PRN
  Administered 2021-10-19 – 2021-10-22 (×8): 1 mg via INTRAVENOUS
  Filled 2021-10-19 (×8): qty 1

## 2021-10-19 MED ORDER — CLONIDINE HCL 0.1 MG PO TABS
0.1000 mg | ORAL_TABLET | Freq: Two times a day (BID) | ORAL | Status: DC
  Administered 2021-10-19 – 2021-10-23 (×9): 0.1 mg via ORAL
  Filled 2021-10-19 (×9): qty 1

## 2021-10-19 MED ORDER — CEPHALEXIN 250 MG PO CAPS
250.0000 mg | ORAL_CAPSULE | ORAL | Status: DC
  Administered 2021-10-20 – 2021-10-23 (×4): 250 mg via ORAL
  Filled 2021-10-19 (×4): qty 1

## 2021-10-19 MED ORDER — CHLORHEXIDINE GLUCONATE CLOTH 2 % EX PADS
6.0000 | MEDICATED_PAD | Freq: Every day | CUTANEOUS | Status: DC
Start: 2021-10-19 — End: 2021-10-23
  Administered 2021-10-19 – 2021-10-23 (×5): 6 via TOPICAL

## 2021-10-19 NOTE — Hospital Course (Addendum)
Dylan Sweeney was admitted to the hospital with the working diagnosis of hematuria. Patient was placed on continuous bladder irrigation per urology recommendations with improvement in hematuria, but continue to have blood clots and suprapubic pain. Urology recommendations for discharge with foley catheter.

## 2021-10-19 NOTE — Progress Notes (Signed)
Urology Inpatient Progress Report  Gross hematuria [R31.0]        Intv/Subj: No acute events overnight. Patient is without complaint. 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 (HCC)   Persistent gross hematuria   PTSD (post-traumatic stress disorder)  Current Facility-Administered Medications  Medication Dose Route Frequency Provider Last Rate Last Admin   0.9 %  sodium chloride infusion   Intravenous Continuous Norins, Heinz Knuckles, MD 50 mL/hr at 10/18/21 1746 New Bag at 10/18/21 1746   albuterol (PROVENTIL) (2.5 MG/3ML) 0.083% nebulizer solution 2.5 mg  2.5 mg Nebulization Q6H PRN Luna Fuse, MD       amLODipine (NORVASC) tablet 10 mg  10 mg Oral Daily Norins, Heinz Knuckles, MD   10 mg at 10/18/21 1741   calcitRIOL (ROCALTROL) capsule 0.25 mcg  0.25 mcg Oral Daily Norins, Heinz Knuckles, MD   0.25 mcg at 10/18/21 1742   cephALEXin (KEFLEX) capsule 500 mg  500 mg Oral BID Neena Rhymes, MD   500 mg at 56/38/75 6433   folic acid (FOLVITE) tablet 1 mg  1 mg Oral Daily Norins, Heinz Knuckles, MD   1 mg at 10/18/21 1751   hydrALAZINE (APRESOLINE) tablet 100 mg  100 mg Oral TID Neena Rhymes, MD       HYDROcodone-acetaminophen (NORCO/VICODIN) 5-325 MG per tablet 1 tablet  1 tablet Oral Q6H PRN Norins, Heinz Knuckles, MD   1 tablet at 10/18/21 2004   labetalol (NORMODYNE) injection 10 mg  10 mg Intravenous Q2H PRN Norins, Heinz Knuckles, MD   10 mg at 10/19/21 0327   labetalol (NORMODYNE) tablet 300 mg  300 mg Oral TID Neena Rhymes, MD   300 mg at 10/18/21 2243   losartan (COZAAR) tablet 100 mg  100 mg Oral Daily Norins, Heinz Knuckles, MD   100 mg at 10/18/21 1741   mometasone-formoterol (DULERA) 200-5 MCG/ACT inhaler 2 puff  2 puff Inhalation BID Norins, Heinz Knuckles, MD       oxybutynin (DITROPAN-XL) 24 hr tablet 5 mg  5 mg Oral QHS Marton Redwood III, MD   5 mg at  10/19/21 0323   sertraline (ZOLOFT) tablet 25 mg  25 mg Oral Daily Norins, Heinz Knuckles, MD       sodium chloride irrigation 0.9 % 3,000 mL  3,000 mL Irrigation Continuous Marton Redwood III, MD   3,000 mL at 10/18/21 1641   tamsulosin (FLOMAX) capsule 0.8 mg  0.8 mg Oral QHS Norins, Heinz Knuckles, MD   0.8 mg at 10/18/21 2242   traZODone (DESYREL) tablet 100 mg  100 mg Oral QHS Neena Rhymes, MD   100 mg at 10/18/21 2242     Objective: Vital: Vitals:   10/18/21 2316 10/19/21 0000 10/19/21 0316 10/19/21 0423  BP: 140/86  (!) 159/105 (!) 153/95  Pulse: 77  89 81  Resp: 18  18   Temp: 98.1 F (36.7 C)  98.7 F (37.1 C)   TempSrc: Oral  Oral   SpO2: 94%  96%   Weight:  75.5 kg    Height:  5\' 11"  (1.803 m)     I/Os: I/O last 3 completed shifts: In: 2951 [P.O.:120; Other:6000] Out: 7725 [Urine:7725]  Physical Exam:  General: Patient is in no apparent distress Lungs: Normal respiratory effort, chest expands symmetrically. GI: The abdomen is soft  and nontender without mass. Foley: Draining clear to light pink off CBI Ext: lower extremities symmetric  Lab Results: Recent Labs    10/17/21 2039 10/18/21 1110 10/19/21 0308  WBC 9.7 10.2  --   HGB 9.6* 10.8* 9.7*  HCT 29.7* 33.6* 29.6*   Recent Labs    10/17/21 2039 10/18/21 1110  NA 136 136  K 5.1 5.2*  CL 105 108  CO2 15* 13*  GLUCOSE 101* 110*  BUN 86* 90*  CREATININE 11.06* 11.63*  CALCIUM 7.7* 8.1*   No results for input(s): "LABPT", "INR" in the last 72 hours. No results for input(s): "LABURIN" in the last 72 hours. Results for orders placed or performed during the hospital encounter of 02/03/20  SARS CORONAVIRUS 2 (TAT 6-24 HRS) Nasopharyngeal Nasopharyngeal Swab     Status: None   Collection Time: 02/03/20  3:00 PM   Specimen: Nasopharyngeal Swab  Result Value Ref Range Status   SARS Coronavirus 2 NEGATIVE NEGATIVE Final    Comment: (NOTE) SARS-CoV-2 target nucleic acids are NOT DETECTED.  The SARS-CoV-2  RNA is generally detectable in upper and lower respiratory specimens during the acute phase of infection. Negative results do not preclude SARS-CoV-2 infection, do not rule out co-infections with other pathogens, and should not be used as the sole basis for treatment or other patient management decisions. Negative results must be combined with clinical observations, patient history, and epidemiological information. The expected result is Negative.  Fact Sheet for Patients: SugarRoll.be  Fact Sheet for Healthcare Providers: https://www.woods-mathews.com/  This test is not yet approved or cleared by the [place] FDA and  has been authorized for detection and/or diagnosis of SARS-CoV-2 by FDA under an Emergency Use Authorization (EUA). This EUA will remain  in effect (meaning this test can be used) for the duration of the COVID-19 declaration under Se ction 564(b)(1) of the Act, 21 U.S.C. section 360bbb-3(b)(1), unless the authorization is terminated or revoked sooner.  Performed at [facility], [place] [address] [place], [place] [address]     Studies/Results: No results found.  Assessment: Gross hematuria  Plan: Urine has cleared up on CBI with conservative management.  Okay from a urological standpoint for discharge.  Discharge with Foley catheter.  He plans to follow-up at the [facility] but I told him he could call our office if he has any problems and would like to be seen there.  Renal function worsened.  Defer to hospitalist service for this.   Link Snuffer, MD Urology 10/19/2021, 7:43 AM

## 2021-10-19 NOTE — Plan of Care (Signed)
  Problem: Coping: Goal: Level of anxiety will decrease Outcome: Completed/Met   Problem: Pain Managment: Goal: General experience of comfort will improve Outcome: Completed/Met   Problem: Safety: Goal: Ability to remain free from injury will improve Outcome: Completed/Met   Problem: Skin Integrity: Goal: Risk for impaired skin integrity will decrease Outcome: Completed/Met

## 2021-10-19 NOTE — Progress Notes (Addendum)
Progress Note   Patient: Dylan Sweeney:[id number] DOB: 26-May-1970 DOA: 10/18/2021     0 DOS: the patient was seen and examined on 10/19/2021   Brief hospital course: Dylan Sweeney was admitted to the hospital with the working diagnosis of hematuria.   51 yo male with the past medical history of hypertension, CKD stage 5, BPH and PTSD who presented with hematuria. Patient reported decreased urine output, that prompted him to come to the hospital. In the ED he was found to have urinary retention and a foley cathter was placed, obtaining 1000 ml of urine. He was discharge home with Foley cathter but he developed hematuria and returned to the ED. On his physical examination his blood pressure was 187/106, HR 92, RR 15 and 02 saturation 98%, heart with S1 and S2 present and rhythmic, positive systolic murmur at the left sternal border, lungs with no rales or wheezing, abdomen not distended and no lower extremity edema.  Vascular access on proximal left upper extremity with thrill present.   Na 136, K 5,2 CL 108, bicarbonate 13, glucose 110 bun 90 cr 11,6 Wbc 10,2 hgb 10,8 plt 229  Urine analysis with SG 1,006, 30 protein, 11-20 wbc   Patient was placed on continuous bladder irrigation per urology recommendations with improvement in hematuria, but continue to have blood clots and suprapubic pain.   Assessment and Plan: * Persistent gross hematuria Urinary retention.  Acute blood loss anemia due to hematuria.   Patient tolerating well continuous bladder irrigation, but continue to have blood clots and suprapubic pain, hematuria has improved, but has been recurrent during the day.   Hgb is 9,7 and hct at 29,6, follow up cell count in am.   Plan to continue bladder irrigation until improvement of his hematuria and no further urinary blood clots.  Holding anticoagulants. Add IV hydromorphone for pain control and continue with oral hydrocodone.  Follow up with Urology recommendations. Patient will  continue with foley cathter at the time of discharge.   Patient has been placed on cephalexin for possible urine infection.  Continue with tamsulosin and oxybutinin.   Acute kidney injury superimposed on chronic kidney disease (HCC) CKD stage 5.  Hyperkalemia.   Patient has a functioning AV fistula on his left upper extremity.  Today with no signs of volume overload but worsening renal function with serum cr at 11,7, with K at 4,7 and serum bicarbonate at 11.   Plan to continue close monitoring of renal function and electrolytes.  Foley cathter to prevent urinary retention.  Continue with calcitriol.    Hypertensive urgency Blood pressure continue to be elevated with 161/100 this am.   Patient on amlodipine 10 mg, hydralazine 100 mg tid, labetalol 300 mg tid.  Will hold on losartan due to progressive renal failure.  Will add clonidine.  Continue close blood pressure monitoring Pain control with oxycodone and hydromorphone.   PTSD (post-traumatic stress disorder) Long standing problem related to [employer] [facility] Continue with sertraline and trazodone.          Subjective: Patient continue to have hematuria with blood clots and suprapubic pain,   Physical Exam: Vitals:   10/19/21 0000 10/19/21 0316 10/19/21 0423 10/19/21 0800  BP:  (!) 159/105 (!) 153/95 (!) 161/106  Pulse:  89 81 84  Resp:  18  18  Temp:  98.7 F (37.1 C)  98.4 F (36.9 C)  TempSrc:  Oral  Oral  SpO2:  96%  98%  Weight:  75.5 kg     Height: 5\' 11"  (1.803 m)      Neurology awake and alert ENT with mild pallor Cardiovascular with S1 and S2 present and rhythmic with no gallops, rubs or murmurs Respiratory with no rales or wheezing Abdomen not distended No lower extremity edema  Data Reviewed:    Family Communication: no family at the bedside   Disposition: Status is: Observation The patient will require care spanning > 2 midnights and should be moved to inpatient because:  continuous bladder irrigation   Planned Discharge Destination: Home    Author: Tawni Millers, MD 10/19/2021 12:59 PM  For on call review [email or web address].

## 2021-10-20 ENCOUNTER — Inpatient Hospital Stay (HOSPITAL_COMMUNITY): Payer: No Typology Code available for payment source

## 2021-10-20 LAB — BASIC METABOLIC PANEL
Anion gap: 13 (ref 5–15)
BUN: 87 mg/dL — ABNORMAL HIGH (ref 6–20)
CO2: 13 mmol/L — ABNORMAL LOW (ref 22–32)
Calcium: 8.4 mg/dL — ABNORMAL LOW (ref 8.9–10.3)
Chloride: 109 mmol/L (ref 98–111)
Creatinine, Ser: 12.16 mg/dL — ABNORMAL HIGH (ref 0.61–1.24)
GFR, Estimated: 5 mL/min — ABNORMAL LOW (ref >60)
Glucose, Bld: 85 mg/dL (ref 70–99)
Potassium: 5.2 mmol/L — ABNORMAL HIGH (ref 3.5–5.1)
Sodium: 135 mmol/L (ref 135–145)

## 2021-10-20 LAB — CBC
HCT: 28.6 % — ABNORMAL LOW (ref 39.0–52.0)
Hemoglobin: 9.3 g/dL — ABNORMAL LOW (ref 13.0–17.0)
MCH: 27.1 pg (ref 26.0–34.0)
MCHC: 32.5 g/dL (ref 30.0–36.0)
MCV: 83.4 fL (ref 80.0–100.0)
Platelets: 186 10*3/uL (ref 150–400)
RBC: 3.43 MIL/uL — ABNORMAL LOW (ref 4.22–5.81)
RDW: 16.8 % — ABNORMAL HIGH (ref 11.5–15.5)
WBC: 8.3 10*3/uL (ref 4.0–10.5)
nRBC: 0 % (ref 0.0–0.2)

## 2021-10-20 MED ORDER — SODIUM BICARBONATE 650 MG PO TABS
650.0000 mg | ORAL_TABLET | Freq: Three times a day (TID) | ORAL | Status: DC
  Administered 2021-10-20 – 2021-10-23 (×9): 650 mg via ORAL
  Filled 2021-10-20 (×10): qty 1

## 2021-10-20 MED ORDER — SODIUM ZIRCONIUM CYCLOSILICATE 10 G PO PACK
10.0000 g | PACK | Freq: Once | ORAL | Status: AC
  Administered 2021-10-20: 10 g via ORAL
  Filled 2021-10-20: qty 1

## 2021-10-20 MED ORDER — SODIUM ZIRCONIUM CYCLOSILICATE 10 G PO PACK
10.0000 g | PACK | ORAL | Status: DC
  Administered 2021-10-20: 10 g via ORAL
  Filled 2021-10-20 (×2): qty 1

## 2021-10-20 MED ORDER — DIPHENHYDRAMINE HCL 25 MG PO CAPS
25.0000 mg | ORAL_CAPSULE | Freq: Four times a day (QID) | ORAL | Status: DC | PRN
  Administered 2021-10-20: 25 mg via ORAL
  Filled 2021-10-20: qty 1

## 2021-10-20 MED ORDER — SODIUM CHLORIDE 0.9 % IR SOLN
3000.0000 mL | Status: DC
  Administered 2021-10-20 – 2021-10-21 (×5): 3000 mL

## 2021-10-20 NOTE — Progress Notes (Signed)
Urology Inpatient Progress Report  Gross hematuria [R31.0] Renal failure [N19]        Intv/Subj: Patient had a recurrence of hematuria with a little bit of clot.  Also renal function had worsened.  For this reason, underwent a CT scan of the abdomen and pelvis without contrast.  This revealed bilateral hydronephrosis right greater than left.  He also had a very thick-walled bladder.  No discrete bladder tumor or clot within the bladder.  Foley catheter in good position.  Principal Problem:   Persistent gross hematuria Active Problems:   Hypertensive urgency   Acute kidney injury superimposed on chronic kidney disease (HCC)   PTSD (post-traumatic stress disorder)  Current Facility-Administered Medications  Medication Dose Route Frequency Provider Last Rate Last Admin   albuterol (PROVENTIL) (2.5 MG/3ML) 0.083% nebulizer solution 2.5 mg  2.5 mg Nebulization Q6H PRN Hong, Joshua S, MD       amLODipine (NORVASC) tablet 10 mg  10 mg Oral Daily Norins, Heinz Knuckles, MD   10 mg at 10/20/21 7371   calcitRIOL (ROCALTROL) capsule 0.25 mcg  0.25 mcg Oral Daily Norins, Heinz Knuckles, MD   0.25 mcg at 10/20/21 0930   cephALEXin (KEFLEX) capsule 250 mg  250 mg Oral Q24H Wise, Nason S, RPH   250 mg at 10/20/21 0930   Chlorhexidine Gluconate Cloth 2 % PADS 6 each  6 each Topical Daily Arrien, Jimmy Picket, MD   6 each at 10/20/21 1000   cloNIDine (CATAPRES) tablet 0.1 mg  0.1 mg Oral BID Arrien, Jimmy Picket, MD   0.1 mg at 10/20/21 0930   diphenhydrAMINE (BENADRYL) capsule 25 mg  25 mg Oral Q6H PRN Tawni Millers, MD   25 mg at 10/27/92 8546   folic acid (FOLVITE) tablet 1 mg  1 mg Oral Daily Norins, Heinz Knuckles, MD   1 mg at 10/20/21 0930   hydrALAZINE (APRESOLINE) tablet 100 mg  100 mg Oral TID Neena Rhymes, MD   100 mg at 10/20/21 1359   HYDROcodone-acetaminophen (NORCO/VICODIN) 5-325 MG per tablet 1 tablet  1 tablet Oral Q6H PRN Norins, Heinz Knuckles, MD   1 tablet at 10/20/21 0935    HYDROmorphone (DILAUDID) injection 1 mg  1 mg Intravenous Q6H PRN Arrien, Jimmy Picket, MD   1 mg at 10/20/21 1217   labetalol (NORMODYNE) injection 10 mg  10 mg Intravenous Q2H PRN Norins, Heinz Knuckles, MD   10 mg at 10/19/21 0327   labetalol (NORMODYNE) tablet 300 mg  300 mg Oral TID Neena Rhymes, MD   300 mg at 10/20/21 1359   mometasone-formoterol (DULERA) 200-5 MCG/ACT inhaler 2 puff  2 puff Inhalation BID Neena Rhymes, MD   2 puff at 10/20/21 0759   oxybutynin (DITROPAN-XL) 24 hr tablet 5 mg  5 mg Oral QHS Marton Redwood III, MD   5 mg at 10/19/21 2126   sertraline (ZOLOFT) tablet 25 mg  25 mg Oral Daily Norins, Heinz Knuckles, MD   25 mg at 10/20/21 2703   sodium bicarbonate tablet 650 mg  650 mg Oral TID Tawni Millers, MD   650 mg at 10/20/21 1403   sodium chloride irrigation 0.9 % 3,000 mL  3,000 mL Irrigation Continuous Tawni Millers, MD   3,000 mL at 10/20/21 1635   tamsulosin (FLOMAX) capsule 0.8 mg  0.8 mg Oral QHS Norins, Heinz Knuckles, MD   0.8 mg at 10/19/21 2112   traZODone (DESYREL) tablet 100 mg  100 mg Oral QHS  Neena Rhymes, MD   100 mg at 10/20/21 0028     Objective: Vital: Vitals:   10/20/21 0700 10/20/21 0927 10/20/21 1212 10/20/21 1336  BP:  135/87 127/81 122/72  Pulse:   89   Resp:   18 18  Temp:  97.7 F (36.5 C) 98.2 F (36.8 C) 98.4 F (36.9 C)  TempSrc:  Oral Oral Oral  SpO2:  97% 97% 96%  Weight: 73.4 kg     Height:       I/Os: I/O last 3 completed shifts: In: [address]20 [P.O.:520; Other:27000] Out: [place] [[id number]]  Physical Exam:  General: Patient is in no apparent distress Lungs: Normal respiratory effort, chest expands symmetrically. GI: The abdomen is soft and nontender without mass. Foley: Three-way Foley catheter in place draining clear on slow drip CBI Ext: lower extremities symmetric  Lab Results: Recent Labs    10/17/21 2039 10/18/21 1110 10/19/21 0308 10/20/21 0402  WBC 9.7 10.2  --  8.3  HGB 9.6* 10.8*  9.7* 9.3*  HCT 29.7* 33.6* 29.6* 28.6*   Recent Labs    10/18/21 1110 10/19/21 1204 10/20/21 0402  NA 136 134* 135  K 5.2* 4.7 5.2*  CL 108 108 109  CO2 13* 11* 13*  GLUCOSE 110* 124* 85  BUN 90* 85* 87*  CREATININE 11.63* 11.71* 12.16*  CALCIUM 8.1* 8.2* 8.4*   No results for input(s): "LABPT", "INR" in the last 72 hours. No results for input(s): "LABURIN" in the last 72 hours. Results for orders placed or performed during the hospital encounter of 10/17/21  Urine Culture     Status: None   Collection Time: 10/17/21  9:10 PM   Specimen: Urine, Clean Catch  Result Value Ref Range Status   Specimen Description URINE, CLEAN CATCH  Final   Special Requests NONE  Final   Culture   Final    NO GROWTH Performed at [facility], [place] [address] [place], [place] [address]    Report Status 10/19/2021 FINAL  Final    Studies/Results: CT ABDOMEN PELVIS WO CONTRAST  Result Date: 10/20/2021 CLINICAL DATA:  Macroscopic hematuria. EXAM: CT ABDOMEN AND PELVIS WITHOUT CONTRAST TECHNIQUE: Multidetector CT imaging of the abdomen and pelvis was performed following the standard protocol without IV contrast. RADIATION DOSE REDUCTION: This exam was performed according to the departmental dose-optimization program which includes automated exposure control, adjustment of the mA and/or kV according to patient size and/or use of iterative reconstruction technique. COMPARISON:  09/21/2018 FINDINGS: Lower chest: Lung bases are clear. Hepatobiliary: No focal liver abnormality is seen. No gallstones, gallbladder wall thickening, or biliary dilatation. Pancreas: Unremarkable. No pancreatic ductal dilatation or surrounding inflammatory changes. Spleen: Normal in size without focal abnormality. Adrenals/Urinary Tract: Normal adrenal glands. Bilateral renal cortical thinning, greater on the left. Moderate right and mild left hydronephrosis and hydroureter. No ureteral stone. 5 mm low-attenuation lesion in the  lateral midpole the right kidney, too small to characterize, but consistent with a cyst. No other renal masses. No intrarenal stones. Thick-walled bladder is collapsed around a Foley catheter. There is hazy inflammatory change in the surrounding perivesicular fat. No bladder stone. Stomach/Bowel: Normal stomach. Small bowel and colon are normal in caliber. No wall thickening. No inflammation. Air-fluid levels noted in the right colon, nonspecific. Normal appendix visualized. Vascular/Lymphatic: No significant vascular findings are present. No enlarged abdominal or pelvic lymph nodes. Reproductive: Prostate not well-defined, grossly normal in size. Other: Small fat containing umbilical hernia. Trace free fluid in the posterior  pelvic recess. Musculoskeletal: No fracture or acute finding.  No bone lesion. IMPRESSION: 1. Thick-walled bladder is collapsed around a Foley catheter with surrounding, perivesicular inflammatory change. Moderate right and mild left hydroureteronephrosis, without evidence of a ureteral stone. This is presumably due to the bladder pathology. The bladder may be diffusely inflamed, although neoplastic disease is also in the differential diagnosis. 2. No other acute abnormality within the abdomen or pelvis. Electronically Signed   By: Lajean Manes M.D.   On: 10/20/2021 15:59   US RENAL  Result Date: 10/20/2021 CLINICAL DATA:  Renal failure. EXAM: RENAL / URINARY TRACT ULTRASOUND COMPLETE COMPARISON:  CT of the abdomen and pelvis on 09/21/2018, renal ultrasound on 09/21/2018 FINDINGS: Right Kidney: Renal measurements: 9.1 x 4.1 x 4.1 centimeters = volume: 78.3 mL. Renal parenchyma is echogenic, similar to prior study. There is a simple cyst in the UPPER pole region, measuring 2.0 x 1.8 centimeters. There has been interval development of moderate RIGHT hydronephrosis. Left Kidney: Renal measurements: 10.3 x 4.6 x 3.8 centimeters = volume: 93.1 mL. Renal parenchyma is echogenic, similar to prior  study. Simple cyst in the LOWER pole region is 1.7 x 1.7 x 1.5 centimeters. No hydronephrosis. Bladder: A Foley catheter decompresses the urinary bladder. Urinary bladder wall remains significantly thickened, measuring up to 2.3 centimeters. Other: None. IMPRESSION: 1. Interval development of moderate RIGHT hydronephrosis. Recommend further evaluation with CT of the abdomen and pelvis. 2. Echogenic kidneys bilaterally. 3. Bilateral simple renal cysts. 4. Persistent, significant thickening of the urinary bladder wall. Electronically Signed   By: Nolon Nations M.D.   On: 10/20/2021 11:37    Assessment: Gross hematuria Stage V renal insufficiency, worsening Bilateral hydronephrosis  Plan: I spoke briefly with nephrology.  They will come by tomorrow.  Also talked to the patient about the findings.  1 option would be bilateral nephrostomy tubes.  However, he is already approaching dialysis.  He would like to avoid nephrostomy tubes and instead proceed with dialysis.  There is not much renal function to preserve with nephrostomy tubes.  High likelihood of failure of ureteral stents in his case with a very thick-walled bladder and they can also be uncomfortable and cause more gross hematuria.  Recommend turning off the continuous bladder irrigation in the morning.  Discharged with Foley catheter.  He has a urologist at the [facility].   Link Snuffer, MD Urology 10/20/2021, 5:53 PM

## 2021-10-20 NOTE — Plan of Care (Signed)
  Problem: Education: Goal: Knowledge of General Education information will improve Description: Including pain rating scale, medication(s)/side effects and non-pharmacologic comfort measures Outcome: Completed/Met   Problem: Health Behavior/Discharge Planning: Goal: Ability to manage health-related needs will improve Outcome: Completed/Met   Problem: Clinical Measurements: Goal: Will remain free from infection Outcome: Completed/Met   Problem: Activity: Goal: Risk for activity intolerance will decrease Outcome: Completed/Met

## 2021-10-20 NOTE — Progress Notes (Signed)
Progress Note   Patient: ALEEM ELZA YDX:[id number] DOB: 04/02/1971 DOA: 10/18/2021     1 DOS: the patient was seen and examined on 10/20/2021   Brief hospital course: Mr. Hartland was admitted to the hospital with the working diagnosis of hematuria.   51 yo male with the past medical history of hypertension, CKD stage 5, BPH and PTSD who presented with hematuria. Patient reported decreased urine output, that prompted him to come to the hospital. In the ED he was found to have urinary retention and a foley cathter was placed, obtaining 1000 ml of urine. He was discharge home with Foley cathter but he developed hematuria and returned to the ED. On his physical examination his blood pressure was 187/106, HR 92, RR 15 and 02 saturation 98%, heart with S1 and S2 present and rhythmic, positive systolic murmur at the left sternal border, lungs with no rales or wheezing, abdomen not distended and no lower extremity edema.  Vascular access on proximal left upper extremity with thrill present.   Na 136, K 5,2 CL 108, bicarbonate 13, glucose 110 bun 90 cr 11,6 Wbc 10,2 hgb 10,8 plt 229  Urine analysis with SG 1,006, 30 protein, 11-20 wbc   Patient was placed on continuous bladder irrigation per urology recommendations with improvement in hematuria, but continue to have blood clots and suprapubic pain.   Slowly improving hematuria. Persistent elevation in serum cr and mild hyperkalemia.   Assessment and Plan: * Persistent gross hematuria Urinary retention.  Acute blood loss anemia due to hematuria.  Traumatic hematuria related to foley cathter. Urinary tract infection (not cathter related).    Slowly improving hematuria. Follow up hgb is stable at 9.3   Continue foley cathter and monitor for recurrent or worsening hematuria.   Continue with cephalexin for possible urine infection.  Continue with tamsulosin and oxybutinin.  Patient will be discharged with Foley cathter in place and [facility], he had a [facility] surgery scheduled, for BPH.   Acute kidney injury superimposed on chronic kidney disease (HCC) CKD stage 5.  Hyperkalemia.   Despite foley cathter and improvement of obstruction, patient continue to have mild hyperkalemia and elevated serum cr.  His renal function today has a serum cr of 12,1 with K at 5,2 and serum bicarbonate at 13 and BUN 87.   Will add sodium zirconium 2 doses today and follow up renal function and electrolytes in am. Add oral sodium bicarbonate and check P in am.   Further work up with renal US.  If persistent Cr elevation will inform Nephrology for a closer follow up at the time of his discharge.  Currently patient has not signs of volume overloaded. No current indication for renal replacement therapy.  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    Hypertensive urgency Blood pressure has improved, 135/87 mmHg.   Plan to continue with amlodipine 10 mg, hydralazine 100 mg tid, labetalol 300 mg tid and clonidine.  Continue to hold ARB due to worsening renal function and hyperkalemia.   Pain control with oxycodone and hydromorphone.   PTSD (post-traumatic stress disorder) Long standing problem related to [employer] [facility] Continue with sertraline and trazodone.          Subjective: Patient with no chest pain or dyspnea, yesterday had suprapubic pain due to urinary blood clots. This am no recurrent pain.   Physical Exam: Vitals:   10/19/21 2359 10/20/21 0456 10/20/21 0700 10/20/21 0927  BP: 128/75 133/79  135/87  Pulse: 88 81    Resp: 18 18    Temp: 97.9 F (36.6 C)   97.7 F (36.5 C)  TempSrc: Oral   Oral  SpO2: 96% 95%  97%  Weight:   73.4 kg   Height:       Neurology awake and alert ENT with mild pallor Cardiovascular with S1 and S2 present and rhythmic with no gallops, rubs or murmurs Respiratory with no rales or wheezing Abdomen not distended No lower extremity edema  Data  Reviewed:    Family Communication: I spoke with patient's wife at the bedside, we talked in detail about patient's condition, plan of care and prognosis and all questions were addressed.   Disposition: Status is: Inpatient Remains inpatient appropriate because: renal failure and hematuria   Planned Discharge Destination: Home      Author: Tawni Millers, MD 10/20/2021 9:47 AM  For on call review [email or web address].

## 2021-10-20 NOTE — TOC Progression Note (Signed)
Transition of Care [facility]) - Progression Note    Patient Details  Name: Dylan Sweeney MRN: [id number] Date of Birth: 1971-02-19  [facility] CM/SW Contact  Zenon Mayo, RN Phone Number: 10/20/2021, 5:01 PM  Clinical Narrative:    from home, for continous bladder irrigation for hematuria, will have ultra sound soon. TOC will continue to follow for dc needs.         Expected Discharge Plan and Services                                                 Social Determinants of Health (SDOH) Interventions    Readmission Risk Interventions     No data to display

## 2021-10-21 ENCOUNTER — Encounter (HOSPITAL_COMMUNITY): Payer: Self-pay | Admitting: Internal Medicine

## 2021-10-21 LAB — HEMOGLOBIN AND HEMATOCRIT, BLOOD
HCT: 27 % — ABNORMAL LOW (ref 39.0–52.0)
Hemoglobin: 8.8 g/dL — ABNORMAL LOW (ref 13.0–17.0)

## 2021-10-21 LAB — RENAL FUNCTION PANEL
Albumin: 3.2 g/dL — ABNORMAL LOW (ref 3.5–5.0)
Anion gap: 11 (ref 5–15)
BUN: 85 mg/dL — ABNORMAL HIGH (ref 6–20)
CO2: 16 mmol/L — ABNORMAL LOW (ref 22–32)
Calcium: 7.8 mg/dL — ABNORMAL LOW (ref 8.9–10.3)
Chloride: 108 mmol/L (ref 98–111)
Creatinine, Ser: 12.04 mg/dL — ABNORMAL HIGH (ref 0.61–1.24)
GFR, Estimated: 5 mL/min — ABNORMAL LOW (ref >60)
Glucose, Bld: 94 mg/dL (ref 70–99)
Phosphorus: 8.1 mg/dL — ABNORMAL HIGH (ref 2.5–4.6)
Potassium: 4.1 mmol/L (ref 3.5–5.1)
Sodium: 135 mmol/L (ref 135–145)

## 2021-10-21 MED ORDER — CALCIUM ACETATE (PHOS BINDER) 667 MG PO CAPS
667.0000 mg | ORAL_CAPSULE | Freq: Three times a day (TID) | ORAL | Status: DC
  Administered 2021-10-21 – 2021-10-22 (×3): 667 mg via ORAL
  Filled 2021-10-21 (×4): qty 1

## 2021-10-21 MED ORDER — DARBEPOETIN ALFA 150 MCG/0.3ML IJ SOSY
150.0000 ug | PREFILLED_SYRINGE | Freq: Once | INTRAMUSCULAR | Status: AC
  Administered 2021-10-21: 150 ug via SUBCUTANEOUS
  Filled 2021-10-21: qty 0.3

## 2021-10-21 MED ORDER — SODIUM CHLORIDE 0.9 % IV SOLN
250.0000 mg | Freq: Every day | INTRAVENOUS | Status: AC
  Administered 2021-10-21 – 2021-10-22 (×2): 250 mg via INTRAVENOUS
  Filled 2021-10-21 (×2): qty 20

## 2021-10-21 MED ORDER — EPOETIN ALFA-EPBX 10000 UNIT/ML IJ SOLN
20000.0000 [IU] | Freq: Once | INTRAMUSCULAR | Status: DC
  Filled 2021-10-21: qty 2

## 2021-10-21 NOTE — Progress Notes (Signed)
Urology Inpatient Progress Report  Gross hematuria [R31.0] Renal failure [N19]        Intv/Subj: Today, the patient had some gross hematuria and a clot passed through his catheter.  This prompted him to turn his continuous bladder irrigation on.  Prior to turning it on he apparently was having some abdominal pain.  Difficult to tell if this was a clogged catheter versus a bladder spasm which can feel very similar to a patient.  Hemoglobin stable at 8.8.  Upon my assessment, output was completely clear on slow drip CBI.  Principal Problem:   Persistent gross hematuria Active Problems:   Hypertensive urgency   Acute kidney injury superimposed on chronic kidney disease (HCC)   PTSD (post-traumatic stress disorder)  Current Facility-Administered Medications  Medication Dose Route Frequency Provider Last Rate Last Admin   albuterol (PROVENTIL) (2.5 MG/3ML) 0.083% nebulizer solution 2.5 mg  2.5 mg Nebulization Q6H PRN Hong, Joshua S, MD       amLODipine (NORVASC) tablet 10 mg  10 mg Oral Daily Norins, Heinz Knuckles, MD   10 mg at 10/21/21 8413   calcitRIOL (ROCALTROL) capsule 0.25 mcg  0.25 mcg Oral Daily Norins, Heinz Knuckles, MD   0.25 mcg at 10/21/21 0800   calcium acetate (PHOSLO) capsule 667 mg  667 mg Oral TID WC Tawni Millers, MD   667 mg at 10/21/21 1617   cephALEXin (KEFLEX) capsule 250 mg  250 mg Oral Q24H Wise, Nason S, RPH   250 mg at 10/21/21 0801   Chlorhexidine Gluconate Cloth 2 % PADS 6 each  6 each Topical Daily Arrien, Jimmy Picket, MD   6 each at 10/21/21 0900   cloNIDine (CATAPRES) tablet 0.1 mg  0.1 mg Oral BID Tawni Millers, MD   0.1 mg at 10/21/21 0801   diphenhydrAMINE (BENADRYL) capsule 25 mg  25 mg Oral Q6H PRN Tawni Millers, MD   25 mg at 10/20/21 1400   ferric gluconate (FERRLECIT) 250 mg in sodium chloride 0.9 % 250 mL IVPB  250 mg Intravenous Daily Madelon Lips, MD 135 mL/hr at 10/21/21 1356 250 mg at 24/40/10 2725   folic acid  (FOLVITE) tablet 1 mg  1 mg Oral Daily Norins, Heinz Knuckles, MD   1 mg at 10/21/21 0801   hydrALAZINE (APRESOLINE) tablet 100 mg  100 mg Oral TID Neena Rhymes, MD   100 mg at 10/21/21 1347   HYDROcodone-acetaminophen (NORCO/VICODIN) 5-325 MG per tablet 1 tablet  1 tablet Oral Q6H PRN Neena Rhymes, MD   1 tablet at 10/21/21 1617   HYDROmorphone (DILAUDID) injection 1 mg  1 mg Intravenous Q6H PRN Arrien, Jimmy Picket, MD   1 mg at 10/21/21 1346   labetalol (NORMODYNE) injection 10 mg  10 mg Intravenous Q2H PRN Norins, Heinz Knuckles, MD   10 mg at 10/19/21 0327   labetalol (NORMODYNE) tablet 300 mg  300 mg Oral TID Neena Rhymes, MD   300 mg at 10/21/21 1347   mometasone-formoterol (DULERA) 200-5 MCG/ACT inhaler 2 puff  2 puff Inhalation BID Neena Rhymes, MD   2 puff at 10/21/21 0800   oxybutynin (DITROPAN-XL) 24 hr tablet 5 mg  5 mg Oral QHS Marton Redwood III, MD   5 mg at 10/20/21 2118   sertraline (ZOLOFT) tablet 25 mg  25 mg Oral Daily Norins, Heinz Knuckles, MD   25 mg at 10/21/21 0801   sodium bicarbonate tablet 650 mg  650 mg Oral TID Arrien, Riccardo Dubin  Quillian Quince, MD   650 mg at 10/21/21 1347   tamsulosin (FLOMAX) capsule 0.8 mg  0.8 mg Oral QHS Norins, Heinz Knuckles, MD   0.8 mg at 10/20/21 2117   traZODone (DESYREL) tablet 100 mg  100 mg Oral QHS Neena Rhymes, MD   100 mg at 10/20/21 2120     Objective: Vital: Vitals:   10/21/21 0406 10/21/21 0730 10/21/21 1137 10/21/21 1347  BP: 116/68 130/74 132/79 118/72  Pulse: 84 85 85   Resp: 18 18 18    Temp: 98.7 F (37.1 C) 98.2 F (36.8 C) 98.4 F (36.9 C)   TempSrc:  Oral Oral   SpO2: 96% 93% 95%   Weight: 73.7 kg     Height:       I/Os: I/O last 3 completed shifts: In: 5409 [P.O.:600; Other:9000] Out: 30670 [Urine:30670]  Physical Exam:  General: Patient is in no apparent distress Lungs: Normal respiratory effort, chest expands symmetrically. GI: The abdomen is soft and nontender without mass. Foley: Three-way Foley  catheter in place draining clear on slow drip CBI Ext: lower extremities symmetric  Lab Results: Recent Labs    10/19/21 0308 10/20/21 0402 10/21/21 0535  WBC  --  8.3  --   HGB 9.7* 9.3* 8.8*  HCT 29.6* 28.6* 27.0*   Recent Labs    10/19/21 1204 10/20/21 0402 10/21/21 0535  NA 134* 135 135  K 4.7 5.2* 4.1  CL 108 109 108  CO2 11* 13* 16*  GLUCOSE 124* 85 94  BUN 85* 87* 85*  CREATININE 11.71* 12.16* 12.04*  CALCIUM 8.2* 8.4* 7.8*   No results for input(s): "LABPT", "INR" in the last 72 hours. No results for input(s): "LABURIN" in the last 72 hours. Results for orders placed or performed during the hospital encounter of 10/17/21  Urine Culture     Status: None   Collection Time: 10/17/21  9:10 PM   Specimen: Urine, Clean Catch  Result Value Ref Range Status   Specimen Description URINE, CLEAN CATCH  Final   Special Requests NONE  Final   Culture   Final    NO GROWTH Performed at [facility], [place] [address] [place], [place] [address]    Report Status 10/19/2021 FINAL  Final    Studies/Results: CT ABDOMEN PELVIS WO CONTRAST  Result Date: 10/20/2021 CLINICAL DATA:  Macroscopic hematuria. EXAM: CT ABDOMEN AND PELVIS WITHOUT CONTRAST TECHNIQUE: Multidetector CT imaging of the abdomen and pelvis was performed following the standard protocol without IV contrast. RADIATION DOSE REDUCTION: This exam was performed according to the departmental dose-optimization program which includes automated exposure control, adjustment of the mA and/or kV according to patient size and/or use of iterative reconstruction technique. COMPARISON:  09/21/2018 FINDINGS: Lower chest: Lung bases are clear. Hepatobiliary: No focal liver abnormality is seen. No gallstones, gallbladder wall thickening, or biliary dilatation. Pancreas: Unremarkable. No pancreatic ductal dilatation or surrounding inflammatory changes. Spleen: Normal in size without focal abnormality. Adrenals/Urinary Tract: Normal  adrenal glands. Bilateral renal cortical thinning, greater on the left. Moderate right and mild left hydronephrosis and hydroureter. No ureteral stone. 5 mm low-attenuation lesion in the lateral midpole the right kidney, too small to characterize, but consistent with a cyst. No other renal masses. No intrarenal stones. Thick-walled bladder is collapsed around a Foley catheter. There is hazy inflammatory change in the surrounding perivesicular fat. No bladder stone. Stomach/Bowel: Normal stomach. Small bowel and colon are normal in caliber. No wall thickening. No inflammation. Air-fluid levels noted in  the right colon, nonspecific. Normal appendix visualized. Vascular/Lymphatic: No significant vascular findings are present. No enlarged abdominal or pelvic lymph nodes. Reproductive: Prostate not well-defined, grossly normal in size. Other: Small fat containing umbilical hernia. Trace free fluid in the posterior pelvic recess. Musculoskeletal: No fracture or acute finding.  No bone lesion. IMPRESSION: 1. Thick-walled bladder is collapsed around a Foley catheter with surrounding, perivesicular inflammatory change. Moderate right and mild left hydroureteronephrosis, without evidence of a ureteral stone. This is presumably due to the bladder pathology. The bladder may be diffusely inflamed, although neoplastic disease is also in the differential diagnosis. 2. No other acute abnormality within the abdomen or pelvis. Electronically Signed   By: Lajean Manes M.D.   On: 10/20/2021 15:59   US RENAL  Result Date: 10/20/2021 CLINICAL DATA:  Renal failure. EXAM: RENAL / URINARY TRACT ULTRASOUND COMPLETE COMPARISON:  CT of the abdomen and pelvis on 09/21/2018, renal ultrasound on 09/21/2018 FINDINGS: Right Kidney: Renal measurements: 9.1 x 4.1 x 4.1 centimeters = volume: 78.3 mL. Renal parenchyma is echogenic, similar to prior study. There is a simple cyst in the UPPER pole region, measuring 2.0 x 1.8 centimeters. There has  been interval development of moderate RIGHT hydronephrosis. Left Kidney: Renal measurements: 10.3 x 4.6 x 3.8 centimeters = volume: 93.1 mL. Renal parenchyma is echogenic, similar to prior study. Simple cyst in the LOWER pole region is 1.7 x 1.7 x 1.5 centimeters. No hydronephrosis. Bladder: A Foley catheter decompresses the urinary bladder. Urinary bladder wall remains significantly thickened, measuring up to 2.3 centimeters. Other: None. IMPRESSION: 1. Interval development of moderate RIGHT hydronephrosis. Recommend further evaluation with CT of the abdomen and pelvis. 2. Echogenic kidneys bilaterally. 3. Bilateral simple renal cysts. 4. Persistent, significant thickening of the urinary bladder wall. Electronically Signed   By: Nolon Nations M.D.   On: 10/20/2021 11:37    Assessment: Gross hematuria Stage V renal insufficiency  Plan: I discussed with the patient Foley catheter management.  I recommended that he not manage his continuous bladder irrigation on his own.  I discussed that severe bladder pain with a catheter in place may be a bladder spasm but may also represent a clot in the bladder.  The way to tell the difference would be to perform a bladder scan during the episodes of pain.  He did have a severe bladder spasm in the emergency department with a completely empty bladder that gave him a lot of abdominal pain.  He is on Ditropan for this but can still have a significant amount of spasms especially with his diseased bladder state.  After the CBI is turned off tomorrow, recommend capping the in flow port.  I educated him that if the catheter is draining, he does not need irrigation even if a small clot comes through.  Most important thing is that the catheter is draining.  Once he is ready for discharge, he may be sent home with some normal saline and a catheter tip syringe for irrigation if there is an urgent situation.  As noted prior, he has a planned follow-up with his  urologist.   Link Snuffer, MD Urology 10/21/2021, 7:41 PM

## 2021-10-21 NOTE — Progress Notes (Signed)
Patient restarted his bladder irrigation himself because of pain and recurrence of hematuria and blood clots. This was found on assessment. Patient C/O severe pain when irrigation stopped.  Clots noted in tube when assessed. Dr Gloriann Loan notified

## 2021-10-21 NOTE — Progress Notes (Signed)
Progress Note   Patient: Dylan Sweeney ZGY:[id number] DOB: 02/08/1971 DOA: 10/18/2021     2 DOS: the patient was seen and examined on 10/21/2021   Brief hospital course: Mr. Bourdon was admitted to the hospital with the working diagnosis of hematuria.   51 yo male with the past medical history of hypertension, CKD stage 5, BPH and PTSD who presented with hematuria. Patient reported decreased urine output, that prompted him to come to the hospital. In the ED he was found to have urinary retention and a foley cathter was placed, obtaining 1000 ml of urine. He was discharge home with Foley cathter but he developed hematuria and returned to the ED. On his physical examination his blood pressure was 187/106, HR 92, RR 15 and 02 saturation 98%, heart with S1 and S2 present and rhythmic, positive systolic murmur at the left sternal border, lungs with no rales or wheezing, abdomen not distended and no lower extremity edema.  Vascular access on proximal left upper extremity with thrill present.   Na 136, K 5,2 CL 108, bicarbonate 13, glucose 110 bun 90 cr 11,6 Wbc 10,2 hgb 10,8 plt 229  Urine analysis with SG 1,006, 30 protein, 11-20 wbc   Patient was placed on continuous bladder irrigation per urology recommendations with improvement in hematuria, but continue to have blood clots and suprapubic pain.   Slowly improving hematuria. Persistent elevation in serum cr and mild hyperkalemia.   Renal US with right hydronephrosis, CT abdomen and pelvis with moderate right and mild left hydronephrosis, possible due to bladder pathology.   06/20 hematuria has improved, no further urinary clots.   Assessment and Plan: * Persistent gross hematuria Urinary retention.  Acute blood loss anemia due to hematuria.  Traumatic hematuria related to foley cathter. Urinary tract infection (not cathter related).    Today his urine is clear and has not been urinary clots.   On cephalexin for possible urine infection.   Continue with tamsulosin and oxybutinin.   Plan to stop CBI today and continue to monitor for further signs of recurrent hematuria. Patient has a scheduled prostate surgery at the [place]. Follow with urology recommendations on foley cathter at the time of his discharge.   Acute kidney injury superimposed on chronic kidney disease (HCC) CKD stage 5.  Hyperkalemia.  Bilateral hydronephrosis due to bladder pathology.    Today renal function with a stable serum cr at 12.0 with K at 4,1 and serum bicarbonate at 16.  P is 8,1.   Plan to continue close monitoring of renal function in am. Continue sodium bicarbonate oral and will add phosphate binders.  Patient will need a close follow up with nephrology, he does have a vascular access (AV fistula) ready to use in his left upper extremity.  Today with no signs of volume overload, and no criteria for urgent renal replacement therapy.     Hypertensive urgency Systolic blood pressure 591 to 130 mmHg   On amlodipine 10 mg, hydralazine 100 mg tid, labetalol 300 mg tid and clonidine.  Continue to hold ARB due to worsening renal function and hyperkalemia.   Pain control with oxycodone and hydromorphone.   PTSD (post-traumatic stress disorder) Long standing problem related to [employer] [facility] Continue with sertraline and trazodone.          Subjective: Patient is feeling better, no chest pain or dyspnea, no suprapubic pain. No further urinary clots.  Physical Exam: Vitals:   10/20/21 2003 10/21/21 0406 10/21/21 0730 10/21/21  1137  BP: 116/69 116/68 130/74 132/79  Pulse: 84 84 85 85  Resp: 18 18 18 18   Temp: 98.5 F (36.9 C) 98.7 F (37.1 C) 98.2 F (36.8 C) 98.4 F (36.9 C)  TempSrc: Oral  Oral Oral  SpO2: 96% 96% 93% 95%  Weight:  73.7 kg    Height:       Neurology awake and alert ENT with no pallor Cardiovascular with S1 and S2 present and rhythmic with no gallops, rubs or murmurs Respiratory with no  rales or wheezing  Abdomen soft and not distended Foley cathter in place with clear urine  Data Reviewed:   Family Communication: I spoke with patient's family at the bedside, we talked in detail about patient's condition, plan of care and prognosis and all questions were addressed.   Disposition: Status is: Inpatient Remains inpatient appropriate because: follow on hematuria and renal function   Planned Discharge Destination: Home    Author: Tawni Millers, MD 10/21/2021 1:29 PM  For on call review [email or web address].

## 2021-10-21 NOTE — TOC Progression Note (Addendum)
Transition of Care [facility]) - Progression Note    Patient Details  Name: Dylan Sweeney MRN: [id number] Date of Birth: 12/22/1970  [facility] CM/SW Contact  Zenon Mayo, RN Phone Number: 10/21/2021, 11:33 AM  Clinical Narrative:    NCM called April at [facility] she states patient goes to [place] and PCP is Domenica Fail , CSW is [employer] [place]. NCM contacted [place] notification, Josem Kaufmann is [place] [phone].         Expected Discharge Plan and Services                                                 Social Determinants of Health (SDOH) Interventions    Readmission Risk Interventions     No data to display

## 2021-10-21 NOTE — Consult Note (Signed)
[facility]  HISTORY AND PHYSICAL  Dylan [facility] is an 51 y.o. male.    Chief Complaint:  gross hematuria  HPI: Pt is a 64M with a PMH sig for HTN, HLD, sickle cell trait, enlarged prostate, and CKD V followed by Dr Moshe Cipro who is now seen in consultation at the request of Dr. Cathlean Sauer for evaluation and recommendations surrounding AKI on CKD V.  Briefly, pt is followed by the [facility] for enlarged prostate.  Was set to have a TURP in the near future.  Came to [facility] E 6/16 for decrease in UOP, sound to have acute urinary retention with Foley placement- 1L out.  Started having gross hematuria through the Foley and so returned to ED for evaluation yesterday.  Renal US 6/19 showed mild R hydro, CT scan showed mild bilateral hydro.  Urology was consulted, recommended CBI which is ongoing.  Admitted for further evaluation.  In this setting we are asked to see.  [facility]ipro.  He has a working AVF and wants to do home hemo when the time comes.  His wife is a PCT at [employer].   Baseline creatinine is between 9-10, last Cr 9.93 on 09/24/21.  Cr was 11.0 on 6/17 and is 12.04 today.  He denies f/c, n/v, SOB, CP, LE edema, dyseusia, anorexia, hiccups, itching, or other uremic symptoms.  He gets iron and ESA at short stay.  Looks like urine is clearing- orangeish color today.   PMH: Past Medical History:  Diagnosis Date   Asthma    Depression    PTSD   Enlarged heart    History of kidney stones    in kidneys - no problems per patient 02/02/20   Hypertension    Peripheral vascular disease ([place])    Renal disorder    stage 4   Renal insufficiency    Sickle cell anemia (HCC)    Has the trait-per patient (03/12/15)   Sickle cell trait ([place])    Sleep apnea    Per pt on 02/02/20 - uses 2-3 x week as needed when patient can not sleep   PSH: Past Surgical History:  Procedure Laterality Date   AV FISTULA PLACEMENT Left 02/05/2020   Procedure: ARTERIOVENOUS (AV)  FISTULA CREATION LEFT;  Surgeon: [name], MD;  Location: [place];  Service: Vascular;  Laterality: Left;   NO PAST SURGERIES       Past Medical History:  Diagnosis Date   Asthma    Depression    PTSD   Enlarged heart    History of kidney stones    in kidneys - no problems per patient 02/02/20   Hypertension    Peripheral vascular disease ([place])    Renal disorder    stage 4   Renal insufficiency    Sickle cell anemia ([place])    Has the trait-per patient (03/12/15)   Sickle cell trait ([place])    Sleep apnea    Per pt on 02/02/20 - uses 2-3 x week as needed when patient can not sleep    Medications:  Scheduled:  amLODipine  10 mg Oral Daily   calcitRIOL  0.25 mcg Oral Daily   cephALEXin  250 mg Oral Q24H   Chlorhexidine Gluconate Cloth  6 each Topical Daily   cloNIDine  0.1 mg Oral BID   folic acid  1 mg Oral Daily   hydrALAZINE  100 mg Oral TID   labetalol  300 mg  Oral TID   mometasone-formoterol  2 puff Inhalation BID   oxybutynin  5 mg Oral QHS   sertraline  25 mg Oral Daily   sodium bicarbonate  650 mg Oral TID   tamsulosin  0.8 mg Oral QHS   traZODone  100 mg Oral QHS    Medications Prior to Admission  Medication Sig Dispense Refill   albuterol (PROVENTIL HFA;VENTOLIN HFA) 108 (90 BASE) MCG/ACT inhaler Inhale 2 puffs into the lungs every 6 (six) hours as needed for wheezing or shortness of breath.      amLODipine (NORVASC) 10 MG tablet Take 1 tablet (10 mg total) by mouth daily. 30 tablet 2   calcitRIOL (ROCALTROL) 0.25 MCG capsule Take 0.25 mcg by mouth daily.     finasteride (PROSCAR) 5 MG tablet Take 5 mg by mouth daily.     folic acid (FOLVITE) 1 MG tablet Take 1 tablet (1 mg total) by mouth daily. 30 tablet 2   folic acid-vitamin b complex-vitamin c-selenium-zinc (DIALYVITE) 3 MG TABS tablet Take 1 tablet by mouth daily.     labetalol (NORMODYNE) 300 MG tablet Take 300 mg by mouth 3 (three) times daily.     losartan (COZAAR) 100 MG tablet Take 100 mg by  mouth daily.     tamsulosin (FLOMAX) 0.4 MG CAPS capsule Take 1 capsule (0.4 mg total) by mouth daily after breakfast. 30 capsule 0   vitamin B-12 (CYANOCOBALAMIN) 500 MCG tablet Take 500 mcg by mouth daily.     cephALEXin (KEFLEX) 500 MG capsule Take 1 capsule (500 mg total) by mouth 2 (two) times daily for 7 days. (Patient not taking: Reported on 10/19/2021) 14 capsule 0   hydrALAZINE (APRESOLINE) 100 MG tablet Take 1 tablet (100 mg total) by mouth every 8 (eight) hours. (Patient not taking: Reported on 10/19/2021) 90 tablet 2    ALLERGIES:   Allergies  Allergen Reactions   Lisinopril Cough   Other Other (See Comments)    G6 PD pill allergy: pill given before travel in [place]    FAM HX: Family History  Problem Relation Age of Onset   Heart disease Mother    Hypertension Sister    Asthma Sister    Hypertension Brother     Social History:   reports that he has quit smoking. His smoking use included cigarettes. He has never used smokeless tobacco. He reports current alcohol use of about 6.0 standard drinks of alcohol per week. He reports current drug use. Drug: Marijuana.  ROS: ROS: all other systems reviewed and are negative except as per HPI  Blood pressure 130/74, pulse 85, temperature 98.2 F (36.8 C), temperature source Oral, resp. rate 18, height 5\' 11"  (1.803 m), weight 73.7 kg, SpO2 93 %. PHYSICAL EXAM: Physical Exam GEN NAD, lying flat in bed, awake and arousable HEENT EOMI PERRL NECK no JVD PULM clear bilaterally no c/w/r CV RRR no m/r/g ABD soft, nontender, NABS EXT no LE edema NEURO AAO x 3 no asterixis SKIN no rashes or lesions ACCESS: L AVF + T/B, ready for use   Results for orders placed or performed during the hospital encounter of 10/18/21 (from the past 48 hour(s))  Basic metabolic panel     Status: Abnormal   Collection Time: 10/19/21 12:04 PM  Result Value Ref Range   Sodium 134 (L) 135 - 145 mmol/L   Potassium 4.7 3.5 - 5.1 mmol/L   Chloride 108  98 - 111 mmol/L   CO2 11 (L) 22 - 32 mmol/L  Glucose, Bld 124 (H) 70 - 99 mg/dL    Comment: Glucose reference range applies only to samples taken after fasting for at least 8 hours.   BUN 85 (H) 6 - 20 mg/dL   Creatinine, Ser 11.71 (H) 0.61 - 1.24 mg/dL   Calcium 8.2 (L) 8.9 - 10.3 mg/dL   GFR, Estimated 5 (L) >60 mL/min    Comment: (NOTE) Calculated using the CKD-EPI Creatinine Equation (2021)    Anion gap 15 5 - 15    Comment: Performed at [facility] [address], [place], [place] [address]  Basic metabolic panel     Status: Abnormal   Collection Time: 10/20/21  4:02 AM  Result Value Ref Range   Sodium 135 135 - 145 mmol/L   Potassium 5.2 (H) 3.5 - 5.1 mmol/L   Chloride 109 98 - 111 mmol/L   CO2 13 (L) 22 - 32 mmol/L   Glucose, Bld 85 70 - 99 mg/dL    Comment: Glucose reference range applies only to samples taken after fasting for at least 8 hours.   BUN 87 (H) 6 - 20 mg/dL   Creatinine, Ser 12.16 (H) 0.61 - 1.24 mg/dL   Calcium 8.4 (L) 8.9 - 10.3 mg/dL   GFR, Estimated 5 (L) >60 mL/min    Comment: (NOTE) Calculated using the CKD-EPI Creatinine Equation (2021)    Anion gap 13 5 - 15    Comment: Performed at [facility] [address], [place], [place] [address]  CBC     Status: Abnormal   Collection Time: 10/20/21  4:02 AM  Result Value Ref Range   WBC 8.3 4.0 - 10.5 K/uL   RBC 3.43 (L) 4.22 - 5.81 MIL/uL   Hemoglobin 9.3 (L) 13.0 - 17.0 g/dL   HCT 28.6 (L) 39.0 - 52.0 %   MCV 83.4 80.0 - 100.0 fL   MCH 27.1 26.0 - 34.0 pg   MCHC 32.5 30.0 - 36.0 g/dL   RDW 16.8 (H) 11.5 - 15.5 %   Platelets 186 150 - 400 K/uL   nRBC 0.0 0.0 - 0.2 %    Comment: Performed at [facility], [place] [address] [place], [place] [address]  Renal function panel     Status: Abnormal   Collection Time: 10/21/21  5:35 AM  Result Value Ref Range   Sodium 135 135 - 145 mmol/L   Potassium 4.1 3.5 - 5.1 mmol/L   Chloride 108 98 - 111 mmol/L   CO2 16 (L) 22 - 32 mmol/L    Glucose, Bld 94 70 - 99 mg/dL    Comment: Glucose reference range applies only to samples taken after fasting for at least 8 hours.   BUN 85 (H) 6 - 20 mg/dL   Creatinine, Ser 12.04 (H) 0.61 - 1.24 mg/dL   Calcium 7.8 (L) 8.9 - 10.3 mg/dL   Phosphorus 8.1 (H) 2.5 - 4.6 mg/dL   Albumin 3.2 (L) 3.5 - 5.0 g/dL   GFR, Estimated 5 (L) >60 mL/min    Comment: (NOTE) Calculated using the CKD-EPI Creatinine Equation (2021)    Anion gap 11 5 - 15    Comment: Performed at [facility] [address] [place], [place] [address]  Hemoglobin and hematocrit, blood     Status: Abnormal   Collection Time: 10/21/21  5:35 AM  Result Value Ref Range   Hemoglobin 8.8 (L) 13.0 - 17.0 g/dL   HCT 27.0 (L) 39.0 - 52.0 %  Comment: Performed at [facility], [place] [address], [place], [place] [address]    CT ABDOMEN PELVIS WO CONTRAST  Result Date: 10/20/2021 CLINICAL DATA:  Macroscopic hematuria. EXAM: CT ABDOMEN AND PELVIS WITHOUT CONTRAST TECHNIQUE: Multidetector CT imaging of the abdomen and pelvis was performed following the standard protocol without IV contrast. RADIATION DOSE REDUCTION: This exam was performed according to the departmental dose-optimization program which includes automated exposure control, adjustment of the mA and/or kV according to patient size and/or use of iterative reconstruction technique. COMPARISON:  09/21/2018 FINDINGS: Lower chest: Lung bases are clear. Hepatobiliary: No focal liver abnormality is seen. No gallstones, gallbladder wall thickening, or biliary dilatation. Pancreas: Unremarkable. No pancreatic ductal dilatation or surrounding inflammatory changes. Spleen: Normal in size without focal abnormality. Adrenals/Urinary Tract: Normal adrenal glands. Bilateral renal cortical thinning, greater on the left. Moderate right and mild left hydronephrosis and hydroureter. No ureteral stone. 5 mm low-attenuation lesion in the lateral midpole the right kidney, too small to  characterize, but consistent with a cyst. No other renal masses. No intrarenal stones. Thick-walled bladder is collapsed around a Foley catheter. There is hazy inflammatory change in the surrounding perivesicular fat. No bladder stone. Stomach/Bowel: Normal stomach. Small bowel and colon are normal in caliber. No wall thickening. No inflammation. Air-fluid levels noted in the right colon, nonspecific. Normal appendix visualized. Vascular/Lymphatic: No significant vascular findings are present. No enlarged abdominal or pelvic lymph nodes. Reproductive: Prostate not well-defined, grossly normal in size. Other: Small fat containing umbilical hernia. Trace free fluid in the posterior pelvic recess. Musculoskeletal: No fracture or acute finding.  No bone lesion. IMPRESSION: 1. Thick-walled bladder is collapsed around a Foley catheter with surrounding, perivesicular inflammatory change. Moderate right and mild left hydroureteronephrosis, without evidence of a ureteral stone. This is presumably due to the bladder pathology. The bladder may be diffusely inflamed, although neoplastic disease is also in the differential diagnosis. 2. No other acute abnormality within the abdomen or pelvis. Electronically Signed   By: Lajean Manes M.D.   On: 10/20/2021 15:59   US RENAL  Result Date: 10/20/2021 CLINICAL DATA:  Renal failure. EXAM: RENAL / URINARY TRACT ULTRASOUND COMPLETE COMPARISON:  CT of the abdomen and pelvis on 09/21/2018, renal ultrasound on 09/21/2018 FINDINGS: Right Kidney: Renal measurements: 9.1 x 4.1 x 4.1 centimeters = volume: 78.3 mL. Renal parenchyma is echogenic, similar to prior study. There is a simple cyst in the UPPER pole region, measuring 2.0 x 1.8 centimeters. There has been interval development of moderate RIGHT hydronephrosis. Left Kidney: Renal measurements: 10.3 x 4.6 x 3.8 centimeters = volume: 93.1 mL. Renal parenchyma is echogenic, similar to prior study. Simple cyst in the LOWER pole region is  1.7 x 1.7 x 1.5 centimeters. No hydronephrosis. Bladder: A Foley catheter decompresses the urinary bladder. Urinary bladder wall remains significantly thickened, measuring up to 2.3 centimeters. Other: None. IMPRESSION: 1. Interval development of moderate RIGHT hydronephrosis. Recommend further evaluation with CT of the abdomen and pelvis. 2. Echogenic kidneys bilaterally. 3. Bilateral simple renal cysts. 4. Persistent, significant thickening of the urinary bladder wall. Electronically Signed   By: Nolon Nations M.D.   On: 10/20/2021 11:37    Assessment/Plan  AKI on CKD V: secondary to obstructive uropathy.  Has a Foley in and with CBI now.  No uremic symptoms to speak of.  Will keep a close eye but if doesn't develop any then no reason to start HD.  Pt in agreement.   Gross hematuria: urology following, on  CBI, clearing.  Imaging with bilateral mild hydro Anemia: will give ESA- gets it weekly Metabolic acidosis: improving on Na bicarb Hyperkalemia: mild, 5.2, s/p [place] BMM: on calcitriol HTN: better controlled with resumption of home meds Dispo: inpt  Coreen Shippee 10/21/2021, 11:15 AM

## 2021-10-22 ENCOUNTER — Inpatient Hospital Stay (HOSPITAL_COMMUNITY)
Admission: RE | Admit: 2021-10-22 | Discharge: 2021-10-22 | Disposition: A | Discharge status: Home / Self Care | Payer: No Typology Code available for payment source | Source: Ambulatory Visit | Attending: Nephrology | Admitting: Nephrology

## 2021-10-22 DIAGNOSIS — R338 Other retention of urine: Secondary | ICD-10-CM

## 2021-10-22 DIAGNOSIS — N390 Urinary tract infection, site not specified: Secondary | ICD-10-CM

## 2021-10-22 DIAGNOSIS — D62 Acute posthemorrhagic anemia: Secondary | ICD-10-CM

## 2021-10-22 DIAGNOSIS — N185 Chronic kidney disease, stage 5: Secondary | ICD-10-CM

## 2021-10-22 LAB — RENAL FUNCTION PANEL
Albumin: 3.3 g/dL — ABNORMAL LOW (ref 3.5–5.0)
Anion gap: 13 (ref 5–15)
BUN: 85 mg/dL — ABNORMAL HIGH (ref 6–20)
CO2: 16 mmol/L — ABNORMAL LOW (ref 22–32)
Calcium: 7.8 mg/dL — ABNORMAL LOW (ref 8.9–10.3)
Chloride: 106 mmol/L (ref 98–111)
Creatinine, Ser: 12.21 mg/dL — ABNORMAL HIGH (ref 0.61–1.24)
GFR, Estimated: 4 mL/min — ABNORMAL LOW (ref >60)
Glucose, Bld: 87 mg/dL (ref 70–99)
Phosphorus: 8.2 mg/dL — ABNORMAL HIGH (ref 2.5–4.6)
Potassium: 4.1 mmol/L (ref 3.5–5.1)
Sodium: 135 mmol/L (ref 135–145)

## 2021-10-22 LAB — CBC
HCT: 26.5 % — ABNORMAL LOW (ref 39.0–52.0)
Hemoglobin: 9 g/dL — ABNORMAL LOW (ref 13.0–17.0)
MCH: 28.1 pg (ref 26.0–34.0)
MCHC: 34 g/dL (ref 30.0–36.0)
MCV: 82.8 fL (ref 80.0–100.0)
Platelets: 204 10*3/uL (ref 150–400)
RBC: 3.2 MIL/uL — ABNORMAL LOW (ref 4.22–5.81)
RDW: 16 % — ABNORMAL HIGH (ref 11.5–15.5)
WBC: 7.9 10*3/uL (ref 4.0–10.5)
nRBC: 0 % (ref 0.0–0.2)

## 2021-10-22 NOTE — Assessment & Plan Note (Addendum)
Diagnosed on admission. No urine culture obtained. Patient started empirically on Keflex. Continue Keflex to complete a 7 day course.

## 2021-10-22 NOTE — Assessment & Plan Note (Signed)
Noted. Patient follows with nephrology as an outpatient.

## 2021-10-22 NOTE — Assessment & Plan Note (Addendum)
In setting of known prostate cancer. Foley catheter inserted on admission. Complicated by likely bladder spasms. Patient started on oxybutynin and continued on home tamsulosin with increased dose. Discharge with foley catheter, tamsulosin and oxybutynin. Patient to follow-up with his outpatient urologist.

## 2021-10-22 NOTE — Progress Notes (Signed)
PROGRESS NOTE    [facility]ey  CZY:[id number] DOB: 1970-07-08 DOA: 10/18/2021 [facility]: [facility]   Brief Narrative: Mr. Ohlin was admitted to the hospital with the working diagnosis of hematuria.   51 yo male with the past medical history of hypertension, CKD stage 5, BPH and PTSD who presented with hematuria. Patient reported decreased urine output, that prompted him to come to the hospital. In the ED he was found to have urinary retention and a foley cathter was placed, obtaining 1000 ml of urine. He was discharge home with Foley cathter but he developed hematuria and returned to the ED. On his physical examination his blood pressure was 187/106, HR 92, RR 15 and 02 saturation 98%, heart with S1 and S2 present and rhythmic, positive systolic murmur at the left sternal border, lungs with no rales or wheezing, abdomen not distended and no lower extremity edema.  Vascular access on proximal left upper extremity with thrill present.   Na 136, K 5,2 CL 108, bicarbonate 13, glucose 110 bun 90 cr 11,6 Wbc 10,2 hgb 10,8 plt 229  Urine analysis with SG 1,006, 30 protein, 11-20 wbc   Patient was placed on continuous bladder irrigation per urology recommendations with improvement in hematuria, but continue to have blood clots and suprapubic pain.   Slowly improving hematuria. Persistent elevation in serum cr and mild hyperkalemia.   Renal US with right hydronephrosis, CT abdomen and pelvis with moderate right and mild left hydronephrosis, possible due to bladder pathology.   06/20 hematuria improved with small clots noted. CBI restarted by patient   Assessment and Plan: * Persistent gross hematuria Related to foley catheter insertion. Urology consulted. Patient underwent continuous bladder irrigation. Associated anemia. Hematuria improved. -Urology recommendations: pending today.Urinary retention.    Acute kidney injury superimposed on chronic kidney disease (HCC) Baseline CKD  stage V with baseline creatinine around 9-10. Creatinine of 11.06 on admission with peak of 12.21. Patient follows with nephrology as an outpatient and has an AV fistula. Nephrology consulted with no plans for inpatient HD at this time.  Hypertensive urgency -Continue amlodipine, hydralazine labetalol and clonidine. ARB held secondary to AKI.  PTSD (post-traumatic stress disorder) -Continue sertraline and trazodone   UTI (urinary tract infection) Diagnosed on admission. No urine culture obtained. Patient started empirically on Keflex. -Continue Keflex for a 7 day course; renal adjustment required  Acute blood loss anemia See problem, Persistent gross hematuria.  Acute urinary retention In setting of known prostate cancer. Foley catheter inserted on admission. Complicated by likely bladder spasms. Patient started on oxybutynin and continued on home tamsulosin. -Continue foley catheter -Urology recommendations -Continue tamsulosin and oxybutynin  CKD (chronic kidney disease), stage V ([place]) Noted. Patient follows with nephrology as an outpatient.    DVT prophylaxis: None Code Status:   Code Status: Prior Family Communication: None at bedside Disposition Plan: Discharge likely home in 1 day pending urology recommendations and stable hemoglobin   Consultants:  Urology  Procedures:  None  Antimicrobials: Keflex    Subjective: Patient reports no issues overnight.  Objective: BP 126/76 (BP Location: Right Arm)   Pulse 87   Temp 98.3 F (36.8 C) (Oral)   Resp 18   Ht 5\' 11"  (1.803 m)   Wt 74.6 kg   SpO2 93%   BMI 22.93 kg/m   Examination:  General exam: Appears calm and comfortable Respiratory system: Clear to auscultation. Respiratory effort normal. Cardiovascular system: S1 & S2 heard, RRR. No murmurs, rubs, gallops or clicks.  Gastrointestinal system: Abdomen is nondistended, soft and nontender. Normal bowel sounds heard. Central nervous system: Alert and  oriented. No focal neurological deficits. Musculoskeletal: No calf tenderness Skin: No cyanosis. No rashes Psychiatry: Judgement and insight appear normal. Mood & affect appropriate.    Data Reviewed: I have personally reviewed following labs and imaging studies  CBC Lab Results  Component Value Date   WBC 8.3 10/20/2021   RBC 3.43 (L) 10/20/2021   HGB 8.8 (L) 10/21/2021   HCT 27.0 (L) 10/21/2021   MCV 83.4 10/20/2021   MCH 27.1 10/20/2021   PLT 186 10/20/2021   MCHC 32.5 10/20/2021   RDW 16.8 (H) 10/20/2021   LYMPHSABS 0.7 10/18/2021   MONOABS 0.6 10/18/2021   EOSABS 0.0 10/18/2021   BASOSABS 0.0 76/73/4193     Last metabolic panel Lab Results  Component Value Date   NA 135 10/22/2021   K 4.1 10/22/2021   CL 106 10/22/2021   CO2 16 (L) 10/22/2021   BUN 85 (H) 10/22/2021   CREATININE 12.21 (H) 10/22/2021   GLUCOSE 87 10/22/2021   GFRNONAA 4 (L) 10/22/2021   GFRAA 10 (L) 10/11/2019   CALCIUM 7.8 (L) 10/22/2021   PHOS 8.2 (H) 10/22/2021   PROT 7.4 10/18/2021   ALBUMIN 3.3 (L) 10/22/2021   BILITOT 0.7 10/18/2021   ALKPHOS 54 10/18/2021   AST 23 10/18/2021   ALT 18 10/18/2021   ANIONGAP 13 10/22/2021    GFR: Estimated Creatinine Clearance: 7.5 mL/min (A) (by C-G formula based on SCr of 12.21 mg/dL (H)).  Recent Results (from the past 240 hour(s))  Urine Culture     Status: None   Collection Time: 10/17/21  9:10 PM   Specimen: Urine, Clean Catch  Result Value Ref Range Status   Specimen Description URINE, CLEAN CATCH  Final   Special Requests NONE  Final   Culture   Final    NO GROWTH Performed at [facility], [address] [place], [place] [address]    Report Status 10/19/2021 FINAL  Final      Radiology Studies: CT ABDOMEN PELVIS WO CONTRAST  Result Date: 10/20/2021 CLINICAL DATA:  Macroscopic hematuria. EXAM: CT ABDOMEN AND PELVIS WITHOUT CONTRAST TECHNIQUE: Multidetector CT imaging of the abdomen and pelvis was performed following the  standard protocol without IV contrast. RADIATION DOSE REDUCTION: This exam was performed according to the departmental dose-optimization program which includes automated exposure control, adjustment of the mA and/or kV according to patient size and/or use of iterative reconstruction technique. COMPARISON:  09/21/2018 FINDINGS: Lower chest: Lung bases are clear. Hepatobiliary: No focal liver abnormality is seen. No gallstones, gallbladder wall thickening, or biliary dilatation. Pancreas: Unremarkable. No pancreatic ductal dilatation or surrounding inflammatory changes. Spleen: Normal in size without focal abnormality. Adrenals/Urinary Tract: Normal adrenal glands. Bilateral renal cortical thinning, greater on the left. Moderate right and mild left hydronephrosis and hydroureter. No ureteral stone. 5 mm low-attenuation lesion in the lateral midpole the right kidney, too small to characterize, but consistent with a cyst. No other renal masses. No intrarenal stones. Thick-walled bladder is collapsed around a Foley catheter. There is hazy inflammatory change in the surrounding perivesicular fat. No bladder stone. Stomach/Bowel: Normal stomach. Small bowel and colon are normal in caliber. No wall thickening. No inflammation. Air-fluid levels noted in the right colon, nonspecific. Normal appendix visualized. Vascular/Lymphatic: No significant vascular findings are present. No enlarged abdominal or pelvic lymph nodes. Reproductive: Prostate not well-defined, grossly normal in size. Other: Small fat containing umbilical hernia.  Trace free fluid in the posterior pelvic recess. Musculoskeletal: No fracture or acute finding.  No bone lesion. IMPRESSION: 1. Thick-walled bladder is collapsed around a Foley catheter with surrounding, perivesicular inflammatory change. Moderate right and mild left hydroureteronephrosis, without evidence of a ureteral stone. This is presumably due to the bladder pathology. The bladder may be  diffusely inflamed, although neoplastic disease is also in the differential diagnosis. 2. No other acute abnormality within the abdomen or pelvis. Electronically Signed   By: Lajean Manes M.D.   On: 10/20/2021 15:59      LOS: 3 days    Cordelia Poche, MD [facility] 10/22/2021, 3:18 PM   If 7PM-7AM, please contact night-coverage [email or web address]

## 2021-10-22 NOTE — Progress Notes (Signed)
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  Has a Foley in and with CBI now--> now stopped.  No uremic symptoms to speak of.  Will keep a close eye but if doesn't develop any then no reason to start HD.  Pt in agreement.    - continues to have no uremic symptoms and feels well  - followed by Dr Moshe Cipro outpatient  - will need close followup on discharge Gross hematuria: urology following, on CBI, clearing.  Imaging with bilateral mild hydro.  Holding CBI today  Anemia: giving iron (dose 2/2 of feraheme) and ESA- gets it weekly Metabolic acidosis: improving on Na bicarb Hyperkalemia: mild, 5.2, s/p Lokelma BMM: on calcitriol HTN: better controlled with resumption of home meds Dispo: inpt  Subjective:    Seen in room.  Cr 12.2 today, about the same.  He had some clot in his Foley, turned on CBI briefly.     Objective:   BP 126/76 (BP Location: Right Arm)   Pulse 87   Temp 98.3 F (36.8 C) (Oral)   Resp 18   Ht 5\' 11"  (1.803 m)   Wt 74.6 kg   SpO2 93%   BMI 22.93 kg/m   Intake/Output Summary (Last 24 hours) at 10/22/2021 1323 Last data filed at 10/22/2021 8127 Gross per 24 hour  Intake 120 ml  Output 16775 ml  Net -16655 ml   Weight change: 0.907 kg  Physical Exam: Gen:NAD, sleeping and easily arousable CVS: RRR Resp: clear Abd: soft, nontender GU: Foley clearing Ext: NO LE edema  Imaging: CT ABDOMEN PELVIS WO CONTRAST  Result Date: 10/20/2021 CLINICAL DATA:  Macroscopic hematuria. EXAM: CT ABDOMEN AND PELVIS WITHOUT CONTRAST TECHNIQUE: Multidetector CT imaging of the abdomen and pelvis was performed following the standard protocol without IV contrast. RADIATION DOSE REDUCTION: This exam was performed according to the departmental dose-optimization program which includes automated exposure control, adjustment of the mA and/or kV according to patient size and/or use of iterative reconstruction  technique. COMPARISON:  09/21/2018 FINDINGS: Lower chest: Lung bases are clear. Hepatobiliary: No focal liver abnormality is seen. No gallstones, gallbladder wall thickening, or biliary dilatation. Pancreas: Unremarkable. No pancreatic ductal dilatation or surrounding inflammatory changes. Spleen: Normal in size without focal abnormality. Adrenals/Urinary Tract: Normal adrenal glands. Bilateral renal cortical thinning, greater on the left. Moderate right and mild left hydronephrosis and hydroureter. No ureteral stone. 5 mm low-attenuation lesion in the lateral midpole the right kidney, too small to characterize, but consistent with a cyst. No other renal masses. No intrarenal stones. Thick-walled bladder is collapsed around a Foley catheter. There is hazy inflammatory change in the surrounding perivesicular fat. No bladder stone. Stomach/Bowel: Normal stomach. Small bowel and colon are normal in caliber. No wall thickening. No inflammation. Air-fluid levels noted in the right colon, nonspecific. Normal appendix visualized. Vascular/Lymphatic: No significant vascular findings are present. No enlarged abdominal or pelvic lymph nodes. Reproductive: Prostate not well-defined, grossly normal in size. Other: Small fat containing umbilical hernia. Trace free fluid in the posterior pelvic recess. Musculoskeletal: No fracture or acute finding.  No bone lesion. IMPRESSION: 1. Thick-walled bladder is collapsed around a Foley catheter with surrounding, perivesicular inflammatory change. Moderate right and mild left hydroureteronephrosis, without evidence of a ureteral stone. This is presumably due to the bladder pathology. The bladder may be diffusely inflamed, although neoplastic disease is also in the differential diagnosis. 2. No other acute abnormality within the abdomen  or pelvis. Electronically Signed   By: Lajean Manes M.D.   On: 10/20/2021 15:59    Labs: BMET Recent Labs  Lab 10/17/21 2039 10/18/21 1110  10/19/21 1204 10/20/21 0402 10/21/21 0535 10/22/21 0240  NA 136 136 134* 135 135 135  K 5.1 5.2* 4.7 5.2* 4.1 4.1  CL 105 108 108 109 108 106  CO2 15* 13* 11* 13* 16* 16*  GLUCOSE 101* 110* 124* 85 94 87  BUN 86* 90* 85* 87* 85* 85*  CREATININE 11.06* 11.63* 11.71* 12.16* 12.04* 12.21*  CALCIUM 7.7* 8.1* 8.2* 8.4* 7.8* 7.8*  PHOS  --   --   --   --  8.1* 8.2*   CBC Recent Labs  Lab 10/17/21 2039 10/18/21 1110 10/19/21 0308 10/20/21 0402 10/21/21 0535  WBC 9.7 10.2  --  8.3  --   NEUTROABS 7.9* 8.9*  --   --   --   HGB 9.6* 10.8* 9.7* 9.3* 8.8*  HCT 29.7* 33.6* 29.6* 28.6* 27.0*  MCV 84.9 84.0  --  83.4  --   PLT 207 229  --  186  --     Medications:     amLODipine  10 mg Oral Daily   calcitRIOL  0.25 mcg Oral Daily   calcium acetate  667 mg Oral TID WC   cephALEXin  250 mg Oral Q24H   Chlorhexidine Gluconate Cloth  6 each Topical Daily   cloNIDine  0.1 mg Oral BID   folic acid  1 mg Oral Daily   hydrALAZINE  100 mg Oral TID   labetalol  300 mg Oral TID   mometasone-formoterol  2 puff Inhalation BID   oxybutynin  5 mg Oral QHS   sertraline  25 mg Oral Daily   sodium bicarbonate  650 mg Oral TID   tamsulosin  0.8 mg Oral QHS   traZODone  100 mg Oral QHS      Madelon Lips, MD 10/22/2021, 1:23 PM

## 2021-10-22 NOTE — Assessment & Plan Note (Signed)
See problem, Persistent gross hematuria.

## 2021-10-23 LAB — CBC
HCT: 26.7 % — ABNORMAL LOW (ref 39.0–52.0)
Hemoglobin: 9.1 g/dL — ABNORMAL LOW (ref 13.0–17.0)
MCH: 28.2 pg (ref 26.0–34.0)
MCHC: 34.1 g/dL (ref 30.0–36.0)
MCV: 82.7 fL (ref 80.0–100.0)
Platelets: 220 10*3/uL (ref 150–400)
RBC: 3.23 MIL/uL — ABNORMAL LOW (ref 4.22–5.81)
RDW: 15.8 % — ABNORMAL HIGH (ref 11.5–15.5)
WBC: 7.2 10*3/uL (ref 4.0–10.5)
nRBC: 0 % (ref 0.0–0.2)

## 2021-10-23 LAB — RENAL FUNCTION PANEL
Albumin: 3.2 g/dL — ABNORMAL LOW (ref 3.5–5.0)
Anion gap: 11 (ref 5–15)
BUN: 77 mg/dL — ABNORMAL HIGH (ref 6–20)
CO2: 17 mmol/L — ABNORMAL LOW (ref 22–32)
Calcium: 8.1 mg/dL — ABNORMAL LOW (ref 8.9–10.3)
Chloride: 107 mmol/L (ref 98–111)
Creatinine, Ser: 12.07 mg/dL — ABNORMAL HIGH (ref 0.61–1.24)
GFR, Estimated: 5 mL/min — ABNORMAL LOW (ref >60)
Glucose, Bld: 166 mg/dL — ABNORMAL HIGH (ref 70–99)
Phosphorus: 7.3 mg/dL — ABNORMAL HIGH (ref 2.5–4.6)
Potassium: 4.1 mmol/L (ref 3.5–5.1)
Sodium: 135 mmol/L (ref 135–145)

## 2021-10-23 MED ORDER — SODIUM BICARBONATE 650 MG PO TABS: 650.0000 mg | ORAL_TABLET | Freq: Three times a day (TID) | ORAL | 0 refills | Status: AC

## 2021-10-23 MED ORDER — TAMSULOSIN HCL 0.4 MG PO CAPS: 0.8000 mg | ORAL_CAPSULE | Freq: Every day | ORAL | 0 refills | Status: DC

## 2021-10-23 MED ORDER — CEPHALEXIN 250 MG PO CAPS
250.0000 mg | ORAL_CAPSULE | ORAL | 0 refills | Status: AC
Start: 2021-10-24 — End: 2021-10-25

## 2021-10-23 MED ORDER — OXYBUTYNIN CHLORIDE ER 5 MG PO TB24: 5.0000 mg | ORAL_TABLET | Freq: Every day | ORAL | 0 refills | Status: AC

## 2021-10-23 MED ORDER — HYDROCODONE-ACETAMINOPHEN 5-325 MG PO TABS: 1.0000 | ORAL_TABLET | Freq: Four times a day (QID) | ORAL | 0 refills | Status: DC | PRN

## 2021-10-23 MED ORDER — CALCIUM ACETATE (PHOS BINDER) 667 MG PO CAPS: 667.0000 mg | ORAL_CAPSULE | Freq: Three times a day (TID) | ORAL | 0 refills | Status: AC

## 2021-10-23 NOTE — Discharge Summary (Signed)
Physician Discharge Summary   Patient: Dylan Sweeney MRN: [id number] DOB: 02/28/1971  Admit date:     10/18/2021  Discharge date: 10/23/21  Discharge Physician: Cordelia Poche, MD   PCP: [facility]   Recommendations at discharge:  Urology follow-up for management of foley catheter/retention  Discharge Diagnoses: Principal Problem:   Persistent gross hematuria Active Problems:   Acute kidney injury superimposed on chronic kidney disease ([place])   Hypertensive urgency   PTSD (post-traumatic stress disorder)   CKD (chronic kidney disease), stage V ([place])   Acute urinary retention   Acute blood loss anemia   UTI (urinary tract infection)  Resolved Problems:   * No resolved hospital problems. [facility] Course: Dylan Sweeney was admitted to the hospital with the working diagnosis of hematuria. Patient was placed on continuous bladder irrigation per urology recommendations with improvement in hematuria, but continue to have blood clots and suprapubic pain. Urology recommendations for discharge with foley catheter.  Assessment and Plan: * Persistent gross hematuria Related to foley catheter insertion. Urology consulted. Patient underwent continuous bladder irrigation. Associated anemia. Hematuria improved. Hemoglobin stable. Discharge with foley catheter.  Acute kidney injury superimposed on chronic kidney disease (H[facility]) Baseline CKD stage V with baseline creatinine around 9-10. Creatinine of 11.06 on admission with peak of 12.21. Patient follows with nephrology as an outpatient and has an AV fistula. Nephrology consulted with no plans for inpatient HD at this time.  Hypertensive urgency Continue amlodipine, hydralazine and labetalol. ARB held secondary to AKI. Blood pressure better controlled.  PTSD (post-traumatic stress disorder) Patient previously prescribed Sertraline for which he listed as discontinued. Patient to follow-up with PCP.  UTI (urinary tract infection) Diagnosed  on admission. No urine culture obtained. Patient started empirically on Keflex. Continue Keflex to complete a 7 day course.  Acute blood loss anemia See problem, Persistent gross hematuria.  Acute urinary retention In setting of known prostate cancer. Foley catheter inserted on admission. Complicated by likely bladder spasms. Patient started on oxybutynin and continued on home tamsulosin with increased dose. Discharge with foley catheter, tamsulosin and oxybutynin. Patient to follow-up with his outpatient urologist.  CKD (chronic kidney disease), stage V ([place]) Noted. Patient follows with nephrology as an outpatient.        Consultants: Urology, Nephrology Procedures performed: Foley catheter insertion, continuous bladder irrigation  Disposition: Home Diet recommendation: Renal diet  DISCHARGE MEDICATION: Allergies as of 10/23/2021       Reactions   Lisinopril Cough   Other Other (See Comments)   G6 PD pill allergy: pill given before travel in [place]        Medication List     STOP taking these medications    losartan 100 MG tablet Commonly known as: COZAAR       TAKE these medications    albuterol 108 (90 Base) MCG/ACT inhaler Commonly known as: VENTOLIN HFA Inhale 2 puffs into the lungs every 6 (six) hours as needed for wheezing or shortness of breath.   amLODipine 10 MG tablet Commonly known as: NORVASC Take 1 tablet (10 mg total) by mouth daily.   calcitRIOL 0.25 MCG capsule Commonly known as: ROCALTROL Take 0.25 mcg by mouth daily.   calcium acetate 667 MG capsule Commonly known as: PHOSLO Take 1 capsule (667 mg total) by mouth 3 (three) times daily with meals.   cephALEXin 250 MG capsule Commonly known as: KEFLEX Take 1 capsule (250 mg total) by mouth daily for 1 day. Start taking on: October 24, 2021  What changed:  medication strength how much to take when to take this   finasteride 5 MG tablet Commonly known as: PROSCAR Take 5 mg by mouth  daily.   folic acid 1 MG tablet Commonly known as: FOLVITE Take 1 tablet (1 mg total) by mouth daily.   folic acid-vitamin b complex-vitamin c-selenium-zinc 3 MG Tabs tablet Take 1 tablet by mouth daily.   hydrALAZINE 100 MG tablet Commonly known as: APRESOLINE Take 1 tablet (100 mg total) by mouth every 8 (eight) hours.   HYDROcodone-acetaminophen 5-325 MG tablet Commonly known as: NORCO/VICODIN Take 1 tablet by mouth every 6 (six) hours as needed for moderate pain or severe pain.   labetalol 300 MG tablet Commonly known as: NORMODYNE Take 300 mg by mouth 3 (three) times daily.   oxybutynin 5 MG 24 hr tablet Commonly known as: DITROPAN-XL Take 1 tablet (5 mg total) by mouth at bedtime.   sodium bicarbonate 650 MG tablet Take 1 tablet (650 mg total) by mouth 3 (three) times daily.   tamsulosin 0.4 MG Caps capsule Commonly known as: FLOMAX Take 2 capsules (0.8 mg total) by mouth daily after supper. What changed:  how much to take when to take this   vitamin B-12 500 MCG tablet Commonly known as: CYANOCOBALAMIN Take 500 mcg by mouth daily.        [facility].   Specialty: General Practice Why: Please follow up in a week. Contact information: [place] [id number] [phone]                Discharge Exam: BP 125/78 (BP Location: Right Arm)   Pulse 87   Temp 98.8 F (37.1 C) (Oral)   Resp 18   Ht 5\' 11"  (1.803 m)   Wt 73.8 kg   SpO2 98%   BMI 22.68 kg/m   General: Well appearing, no distress  Condition at discharge: stable  The results of significant diagnostics from this hospitalization (including imaging, microbiology, ancillary and laboratory) are listed below for reference.   Imaging Studies: CT ABDOMEN PELVIS WO CONTRAST  Result Date: 10/20/2021 CLINICAL DATA:  Macroscopic hematuria. EXAM: CT ABDOMEN AND PELVIS WITHOUT CONTRAST TECHNIQUE: Multidetector CT imaging of the  abdomen and pelvis was performed following the standard protocol without IV contrast. RADIATION DOSE REDUCTION: This exam was performed according to the departmental dose-optimization program which includes automated exposure control, adjustment of the mA and/or kV according to patient size and/or use of iterative reconstruction technique. COMPARISON:  09/21/2018 FINDINGS: Lower chest: Lung bases are clear. Hepatobiliary: No focal liver abnormality is seen. No gallstones, gallbladder wall thickening, or biliary dilatation. Pancreas: Unremarkable. No pancreatic ductal dilatation or surrounding inflammatory changes. Spleen: Normal in size without focal abnormality. Adrenals/Urinary Tract: Normal adrenal glands. Bilateral renal cortical thinning, greater on the left. Moderate right and mild left hydronephrosis and hydroureter. No ureteral stone. 5 mm low-attenuation lesion in the lateral midpole the right kidney, too small to characterize, but consistent with a cyst. No other renal masses. No intrarenal stones. Thick-walled bladder is collapsed around a Foley catheter. There is hazy inflammatory change in the surrounding perivesicular fat. No bladder stone. Stomach/Bowel: Normal stomach. Small bowel and colon are normal in caliber. No wall thickening. No inflammation. Air-fluid levels noted in the right colon, nonspecific. Normal appendix visualized. Vascular/Lymphatic: No significant vascular findings are present. No enlarged abdominal or pelvic lymph nodes. Reproductive: Prostate not well-defined, grossly normal in size. Other: Small  fat containing umbilical hernia. Trace free fluid in the posterior pelvic recess. Musculoskeletal: No fracture or acute finding.  No bone lesion. IMPRESSION: 1. Thick-walled bladder is collapsed around a Foley catheter with surrounding, perivesicular inflammatory change. Moderate right and mild left hydroureteronephrosis, without evidence of a ureteral stone. This is presumably due to  the bladder pathology. The bladder may be diffusely inflamed, although neoplastic disease is also in the differential diagnosis. 2. No other acute abnormality within the abdomen or pelvis. Electronically Signed   By: Lajean Manes M.D.   On: 10/20/2021 15:59   US RENAL  Result Date: 10/20/2021 CLINICAL DATA:  Renal failure. EXAM: RENAL / URINARY TRACT ULTRASOUND COMPLETE COMPARISON:  CT of the abdomen and pelvis on 09/21/2018, renal ultrasound on 09/21/2018 FINDINGS: Right Kidney: Renal measurements: 9.1 x 4.1 x 4.1 centimeters = volume: 78.3 mL. Renal parenchyma is echogenic, similar to prior study. There is a simple cyst in the UPPER pole region, measuring 2.0 x 1.8 centimeters. There has been interval development of moderate RIGHT hydronephrosis. Left Kidney: Renal measurements: 10.3 x 4.6 x 3.8 centimeters = volume: 93.1 mL. Renal parenchyma is echogenic, similar to prior study. Simple cyst in the LOWER pole region is 1.7 x 1.7 x 1.5 centimeters. No hydronephrosis. Bladder: A Foley catheter decompresses the urinary bladder. Urinary bladder wall remains significantly thickened, measuring up to 2.3 centimeters. Other: None. IMPRESSION: 1. Interval development of moderate RIGHT hydronephrosis. Recommend further evaluation with CT of the abdomen and pelvis. 2. Echogenic kidneys bilaterally. 3. Bilateral simple renal cysts. 4. Persistent, significant thickening of the urinary bladder wall. Electronically Signed   By: Nolon Nations M.D.   On: 10/20/2021 11:37    Microbiology: Results for orders placed or performed during the hospital encounter of 10/17/21  Urine Culture     Status: None   Collection Time: 10/17/21  9:10 PM   Specimen: Urine, Clean Catch  Result Value Ref Range Status   Specimen Description URINE, CLEAN CATCH  Final   Special Requests NONE  Final   Culture   Final    NO GROWTH Performed at [facility], [place] [address], [place], [place] [address]    Report Status 10/19/2021  FINAL  Final    Labs: CBC: Recent Labs  Lab 10/17/21 2039 10/18/21 1110 10/19/21 0308 10/20/21 0402 10/21/21 0535 10/22/21 1615 10/23/21 0847  WBC 9.7 10.2  --  8.3  --  7.9 7.2  NEUTROABS 7.9* 8.9*  --   --   --   --   --   HGB 9.6* 10.8* 9.7* 9.3* 8.8* 9.0* 9.1*  HCT 29.7* 33.6* 29.6* 28.6* 27.0* 26.5* 26.7*  MCV 84.9 84.0  --  83.4  --  82.8 82.7  PLT 207 229  --  186  --  204 604   Basic Metabolic Panel: Recent Labs  Lab 10/19/21 1204 10/20/21 0402 10/21/21 0535 10/22/21 0240 10/23/21 0847  NA 134* 135 135 135 135  K 4.7 5.2* 4.1 4.1 4.1  CL 108 109 108 106 107  CO2 11* 13* 16* 16* 17*  GLUCOSE 124* 85 94 87 166*  BUN 85* 87* 85* 85* 77*  CREATININE 11.71* 12.16* 12.04* 12.21* 12.07*  CALCIUM 8.2* 8.4* 7.8* 7.8* 8.1*  PHOS  --   --  8.1* 8.2* 7.3*   Liver Function Tests: Recent Labs  Lab 10/18/21 1110 10/21/21 0535 10/22/21 0240 10/23/21 0847  AST 23  --   --   --   ALT 18  --   --   --  ALKPHOS 54  --   --   --   BILITOT 0.7  --   --   --   PROT 7.4  --   --   --   ALBUMIN 4.2 3.2* 3.3* 3.2*    Discharge time spent: 35 minutes.  Signed: Cordelia Poche, MD [facility] 10/23/2021

## 2021-10-23 NOTE — Progress Notes (Addendum)
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  Has a Foley in and with CBI now--> now stopped.  No uremic symptoms to speak of.  Will keep a close eye but if doesn't develop any then no reason to start HD.  Pt in agreement.    - continues to have no uremic symptoms and feels well- so no indication for dialysis at present  - labs pending for today- will follow up  - followed by Dr Kathrene Bongo outpatient  - will need close followup on discharge Gross hematuria: urology following, on CBI, clearing.  Imaging with bilateral mild hydro, expect to improve with bladder decompression Anemia: giving iron (dose 2/2 of feraheme) and ESA- gets it weekly Metabolic acidosis: improving on Na bicarb Hyperkalemia: mild, 5.2, s/p Lokelma BMM: on calcitriol and phoslo HTN: better controlled with resumption of home meds Dispo: inpt  Subjective:    Seen in room.  Feeling fine- no complaints.  No more clots or sig bladder spasms since yesterday.      Objective:   BP 125/78 (BP Location: Right Arm)   Pulse 87   Temp 98.8 F (37.1 C) (Oral)   Resp 18   Ht 5\' 11"  (1.803 m)   Wt 73.8 kg   SpO2 98%   BMI 22.68 kg/m   Intake/Output Summary (Last 24 hours) at 10/23/2021 0915 Last data filed at 10/23/2021 0517 Gross per 24 hour  Intake 360 ml  Output 5530 ml  Net -5170 ml   Weight change: -0.816 kg  Physical Exam: Gen:NAD, sleeping and easily arousable CVS: RRR Resp: clear Abd: soft, nontender GU: Foley clearing Ext: NO LE edema  Imaging: No results found.  Labs: BMET Recent Labs  Lab 10/17/21 2039 10/18/21 1110 10/19/21 1204 10/20/21 0402 10/21/21 0535 10/22/21 0240  NA 136 136 134* 135 135 135  K 5.1 5.2* 4.7 5.2* 4.1 4.1  CL 105 108 108 109 108 106  CO2 15* 13* 11* 13* 16* 16*  GLUCOSE 101* 110* 124* 85 94 87  BUN 86* 90* 85* 87* 85* 85*  CREATININE 11.06* 11.63* 11.71* 12.16* 12.04* 12.21*  CALCIUM 7.7*  8.1* 8.2* 8.4* 7.8* 7.8*  PHOS  --   --   --   --  8.1* 8.2*   CBC Recent Labs  Lab 10/17/21 2039 10/18/21 1110 10/19/21 0308 10/20/21 0402 10/21/21 0535 10/22/21 1615 10/23/21 0847  WBC 9.7 10.2  --  8.3  --  7.9 7.2  NEUTROABS 7.9* 8.9*  --   --   --   --   --   HGB 9.6* 10.8*   < > 9.3* 8.8* 9.0* 9.1*  HCT 29.7* 33.6*   < > 28.6* 27.0* 26.5* 26.7*  MCV 84.9 84.0  --  83.4  --  82.8 82.7  PLT 207 229  --  186  --  204 220   < > = values in this interval not displayed.    Medications:     amLODipine  10 mg Oral Daily   calcitRIOL  0.25 mcg Oral Daily   calcium acetate  667 mg Oral TID WC   cephALEXin  250 mg Oral Q24H   Chlorhexidine Gluconate Cloth  6 each Topical Daily   cloNIDine  0.1 mg Oral BID   folic acid  1 mg Oral Daily   hydrALAZINE  100 mg Oral TID   labetalol  300 mg Oral TID   mometasone-formoterol  2 puff Inhalation BID   oxybutynin  5 mg Oral QHS   sertraline  25 mg Oral Daily   sodium bicarbonate  650 mg Oral TID   tamsulosin  0.8 mg Oral QHS   traZODone  100 mg Oral QHS      Bufford Buttner, MD 10/23/2021, 9:15 AM

## 2021-10-23 NOTE — Discharge Instructions (Signed)
Dylan Sweeney,  You were in the hospital because of inability to urinate. You had a foley catheter placed and developed bloody urine. This was managed with irrigation and has improved. Please follow-up with your urologist. You were also treated for a UTI.

## 2021-11-01 ENCOUNTER — Other Ambulatory Visit: Payer: Self-pay

## 2021-11-01 ENCOUNTER — Encounter (HOSPITAL_COMMUNITY): Payer: Self-pay

## 2021-11-01 ENCOUNTER — Emergency Department (HOSPITAL_COMMUNITY)
Admission: EM | Admit: 2021-11-01 | Discharge: 2021-11-01 | Disposition: A | Discharge status: Home / Self Care | Payer: No Typology Code available for payment source | Attending: Emergency Medicine | Admitting: Emergency Medicine

## 2021-11-01 DIAGNOSIS — N185 Chronic kidney disease, stage 5: Secondary | ICD-10-CM | POA: Diagnosis not present

## 2021-11-01 DIAGNOSIS — R319 Hematuria, unspecified: Secondary | ICD-10-CM | POA: Insufficient documentation

## 2021-11-01 DIAGNOSIS — Z79899 Other long term (current) drug therapy: Secondary | ICD-10-CM | POA: Diagnosis not present

## 2021-11-01 DIAGNOSIS — I120 Hypertensive chronic kidney disease with stage 5 chronic kidney disease or end stage renal disease: Secondary | ICD-10-CM | POA: Diagnosis not present

## 2021-11-01 DIAGNOSIS — J45909 Unspecified asthma, uncomplicated: Secondary | ICD-10-CM | POA: Insufficient documentation

## 2021-11-01 DIAGNOSIS — R339 Retention of urine, unspecified: Secondary | ICD-10-CM | POA: Insufficient documentation

## 2021-11-01 DIAGNOSIS — R1030 Lower abdominal pain, unspecified: Secondary | ICD-10-CM | POA: Diagnosis not present

## 2021-11-01 LAB — CBC WITH DIFFERENTIAL/PLATELET
Abs Immature Granulocytes: 0.04 10*3/uL (ref 0.00–0.07)
Basophils Absolute: 0 10*3/uL (ref 0.0–0.1)
Basophils Relative: 0 %
Eosinophils Absolute: 0.1 10*3/uL (ref 0.0–0.5)
Eosinophils Relative: 1 %
HCT: 31.4 % — ABNORMAL LOW (ref 39.0–52.0)
Hemoglobin: 10.3 g/dL — ABNORMAL LOW (ref 13.0–17.0)
Immature Granulocytes: 0 %
Lymphocytes Relative: 8 %
Lymphs Abs: 1 10*3/uL (ref 0.7–4.0)
MCH: 28.1 pg (ref 26.0–34.0)
MCHC: 32.8 g/dL (ref 30.0–36.0)
MCV: 85.6 fL (ref 80.0–100.0)
Monocytes Absolute: 0.9 10*3/uL (ref 0.1–1.0)
Monocytes Relative: 7 %
Neutro Abs: 10.4 10*3/uL — ABNORMAL HIGH (ref 1.7–7.7)
Neutrophils Relative %: 84 %
Platelets: 231 10*3/uL (ref 150–400)
RBC: 3.67 MIL/uL — ABNORMAL LOW (ref 4.22–5.81)
RDW: 17 % — ABNORMAL HIGH (ref 11.5–15.5)
WBC: 12.4 10*3/uL — ABNORMAL HIGH (ref 4.0–10.5)
nRBC: 0 % (ref 0.0–0.2)

## 2021-11-01 LAB — URINALYSIS, ROUTINE W REFLEX MICROSCOPIC
Bacteria, UA: NONE SEEN
Bilirubin Urine: NEGATIVE
Glucose, UA: 50 mg/dL — AB
Ketones, ur: NEGATIVE mg/dL
Nitrite: NEGATIVE
Protein, ur: 30 mg/dL — AB
Specific Gravity, Urine: 1.004 — ABNORMAL LOW (ref 1.005–1.030)
pH: 6 (ref 5.0–8.0)

## 2021-11-01 LAB — BASIC METABOLIC PANEL
Anion gap: 15 (ref 5–15)
BUN: 72 mg/dL — ABNORMAL HIGH (ref 6–20)
CO2: 18 mmol/L — ABNORMAL LOW (ref 22–32)
Calcium: 9 mg/dL (ref 8.9–10.3)
Chloride: 98 mmol/L (ref 98–111)
Creatinine, Ser: 9.52 mg/dL — ABNORMAL HIGH (ref 0.61–1.24)
GFR, Estimated: 6 mL/min — ABNORMAL LOW (ref >60)
Glucose, Bld: 103 mg/dL — ABNORMAL HIGH (ref 70–99)
Potassium: 4.1 mmol/L (ref 3.5–5.1)
Sodium: 131 mmol/L — ABNORMAL LOW (ref 135–145)

## 2021-11-01 MED ORDER — HYDROCODONE-ACETAMINOPHEN 5-325 MG PO TABS
1.0000 | ORAL_TABLET | Freq: Once | ORAL | Status: AC
  Administered 2021-11-01: 1 via ORAL
  Filled 2021-11-01: qty 1

## 2021-11-01 MED ORDER — IBUPROFEN 800 MG PO TABS
800.0000 mg | ORAL_TABLET | Freq: Once | ORAL | Status: DC
  Filled 2021-11-01: qty 1

## 2021-11-01 MED ORDER — HYDROCODONE-ACETAMINOPHEN 5-325 MG PO TABS: 1.0000 | ORAL_TABLET | ORAL | 0 refills | Status: DC | PRN

## 2021-11-01 NOTE — ED Provider Triage Note (Signed)
Emergency Medicine Provider Triage Evaluation Note  Dylan Sweeney , a 51 y.o. male  was evaluated in triage.  Pt complains of inability to urinate.  Patient reports that yesterday he had Foley catheter removed which she had in place due to benign prostatic hyperplasia.  The patient states that he has been unable to urinate since yesterday.  The patient is reporting increased abdominal pain however denies any nausea, vomiting, fevers.  Review of Systems  Positive:  Negative:   Physical Exam  BP (!) 155/96 (BP Location: Right Arm)   Pulse 95   Temp 98.3 F (36.8 C) (Oral)   Resp 16   Ht 5\' 11"  (1.803 m)   Wt 72.6 kg   SpO2 97%   BMI 22.32 kg/m  Gen:   Awake, no distress   Resp:  Normal effort  MSK:   Moves extremities without difficulty  Other:    Medical Decision Making  Medically screening exam initiated at 7:44 PM.  Appropriate orders placed.  [name] was informed that the remainder of the evaluation will be completed by another provider, this initial triage assessment does not replace that evaluation, and the importance of remaining in the ED until their evaluation is complete.     Azucena Cecil, PA-C 11/01/21 1944

## 2021-11-01 NOTE — ED Notes (Signed)
Bladder scan greater than 758mL.

## 2021-11-01 NOTE — ED Provider Notes (Signed)
[facility] DEPT Provider Note   CSN: [id number] Arrival date & time: 11/01/21  1914     History  Chief Complaint  Patient presents with   Urinary Retention    Dylan Sweeney is a 51 y.o. male.  Pt is a 51 yo male with a pmhx significant for urinary retention due to BPH.  He also has a hx of HTN, Asthma, PVD, CKD stage V, PTSD.  Pt was admitted on 6/17 and d/c on 6/22 for urinary retention and hematuria.  He was d/c with a foley catheter.  He had this removed yesterday at the [place]. They injected 125 ml of water into the bladder via the foley and he was able to urinate that amt out after they removed the catheter and pt was d/c home.  Pt has not been able to urinate more that a little dribble since yesterday afternoon.  He a lot of lower abdominal swelling and pain.  No other sx.        Home Medications Prior to Admission medications   Medication Sig Start Date End Date Taking? Authorizing Provider  HYDROcodone-acetaminophen (NORCO/VICODIN) 5-325 MG tablet Take 1 tablet by mouth every 4 (four) hours as needed. 11/01/21  Yes Isla Pence, MD  albuterol (PROVENTIL HFA;VENTOLIN HFA) 108 (90 BASE) MCG/ACT inhaler Inhale 2 puffs into the lungs every 6 (six) hours as needed for wheezing or shortness of breath.     [provider]  amLODipine (NORVASC) 10 MG tablet Take 1 tablet (10 mg total) by mouth daily. 03/19/15   Reyne Dumas, MD  calcitRIOL (ROCALTROL) 0.25 MCG capsule Take 0.25 mcg by mouth daily.    [provider]  calcium acetate (PHOSLO) 667 MG capsule Take 1 capsule (667 mg total) by mouth 3 (three) times daily with meals. 10/23/21 11/22/21  Mariel Aloe, MD  finasteride (PROSCAR) 5 MG tablet Take 5 mg by mouth daily.    [provider]  folic acid (FOLVITE) 1 MG tablet Take 1 tablet (1 mg total) by mouth daily. 03/19/15   Reyne Dumas, MD  folic acid-vitamin b complex-vitamin c-selenium-zinc (DIALYVITE) 3 MG TABS  tablet Take 1 tablet by mouth daily.    [provider]  hydrALAZINE (APRESOLINE) 100 MG tablet Take 1 tablet (100 mg total) by mouth every 8 (eight) hours. Patient not taking: Reported on 10/19/2021 03/19/15   Reyne Dumas, MD  HYDROcodone-acetaminophen (NORCO/VICODIN) 5-325 MG tablet Take 1 tablet by mouth every 6 (six) hours as needed for moderate pain or severe pain. 10/23/21   Mariel Aloe, MD  labetalol (NORMODYNE) 300 MG tablet Take 300 mg by mouth 3 (three) times daily.    [provider]  oxybutynin (DITROPAN-XL) 5 MG 24 hr tablet Take 1 tablet (5 mg total) by mouth at bedtime. 10/23/21 11/22/21  Mariel Aloe, MD  sodium bicarbonate 650 MG tablet Take 1 tablet (650 mg total) by mouth 3 (three) times daily. 10/23/21 11/22/21  Mariel Aloe, MD  tamsulosin (FLOMAX) 0.4 MG CAPS capsule Take 2 capsules (0.8 mg total) by mouth daily after supper. 10/23/21   Mariel Aloe, MD  vitamin B-12 (CYANOCOBALAMIN) 500 MCG tablet Take 500 mcg by mouth daily.    [provider]      Allergies    Lisinopril and Other    Review of Systems   Review of Systems  Gastrointestinal:  Positive for abdominal pain.  Genitourinary:  Positive for decreased urine volume and difficulty urinating.  All  other systems reviewed and are negative.   Physical Exam Updated Vital Signs BP (!) 156/92   Pulse 74   Temp 98.3 F (36.8 C) (Oral)   Resp 16   Ht 5\' 11"  (1.803 m)   Wt 72.6 kg   SpO2 100%   BMI 22.32 kg/m  Physical Exam Vitals and nursing note reviewed.  Constitutional:      Appearance: Normal appearance.  HENT:     Head: Normocephalic and atraumatic.     Right Ear: External ear normal.     Left Ear: External ear normal.     Nose: Nose normal.     Mouth/Throat:     Mouth: Mucous membranes are moist.     Pharynx: Oropharynx is clear.  Eyes:     Extraocular Movements: Extraocular movements intact.     Conjunctiva/sclera: Conjunctivae normal.     Pupils: Pupils  are equal, round, and reactive to light.  Cardiovascular:     Rate and Rhythm: Normal rate and regular rhythm.     Pulses: Normal pulses.     Heart sounds: Normal heart sounds.  Pulmonary:     Effort: Pulmonary effort is normal.     Breath sounds: Normal breath sounds.  Abdominal:     General: Abdomen is flat. Bowel sounds are normal.     Palpations: Abdomen is soft.     Tenderness: There is abdominal tenderness in the suprapubic area.  Musculoskeletal:        General: Normal range of motion.     Cervical back: Normal range of motion and neck supple.     Comments: Left upper arm AVF with good thrill  Skin:    General: Skin is warm.     Capillary Refill: Capillary refill takes less than 2 seconds.  Neurological:     General: No focal deficit present.     Mental Status: He is alert and oriented to person, place, and time.  Psychiatric:        Mood and Affect: Mood normal.        Behavior: Behavior normal.     ED Results / Procedures / Treatments   Labs (all labs ordered are listed, but only abnormal results are displayed) Labs Reviewed  URINALYSIS, ROUTINE W REFLEX MICROSCOPIC - Abnormal; Notable for the following components:      Result Value   Color, Urine STRAW (*)    Specific Gravity, Urine 1.004 (*)    Glucose, UA 50 (*)    Hgb urine dipstick SMALL (*)    Protein, ur 30 (*)    Leukocytes,Ua TRACE (*)    All other components within normal limits  BASIC METABOLIC PANEL - Abnormal; Notable for the following components:   Sodium 131 (*)    CO2 18 (*)    Glucose, Bld 103 (*)    BUN 72 (*)    Creatinine, Ser 9.52 (*)    GFR, Estimated 6 (*)    All other components within normal limits  CBC WITH DIFFERENTIAL/PLATELET - Abnormal; Notable for the following components:   WBC 12.4 (*)    RBC 3.67 (*)    Hemoglobin 10.3 (*)    HCT 31.4 (*)    RDW 17.0 (*)    Neutro Abs 10.4 (*)    All other components within normal limits  URINE CULTURE    EKG None  Radiology No  results found.  Procedures Procedures    Medications Ordered in ED Medications  HYDROcodone-acetaminophen (NORCO/VICODIN) 5-325 MG per tablet  1 tablet (1 tablet Oral Given 11/01/21 2019)    ED Course/ Medical Decision Making/ A&P                           Medical Decision Making Amount and/or Complexity of Data Reviewed Labs: ordered.  Risk Prescription drug management.   This patient presents to the ED for concern of urinary retention, this involves an extensive number of treatment options, and is a complaint that carries with it a high risk of complications and morbidity.  The differential diagnosis includes urinary retention, uti   Co morbidities that complicate the patient evaluation  urinary retention due to BPH, HTN, Asthma, PVD, CKD stage V, PTSD   Additional history obtained:  Additional history obtained from epic chart review External records from outside source obtained and reviewed including family   Lab Tests:  I Ordered, and personally interpreted labs.  The pertinent results include:  cbc with hbg 10.3 (hgb 9.1 9 days ago), Cr 9.52 (Cr 12 9 days ago), UA with small hgb, + protein and glucose, no infection   Cardiac Monitoring:  The patient was maintained on a cardiac monitor.  I personally viewed and interpreted the cardiac monitored which showed an underlying rhythm of: nsr   Medicines ordered and prescription drug management:  I ordered medication including lortab  for pain  Reevaluation of the patient after these medicines showed that the patient improved I have reviewed the patients home medicines and have made adjustments as needed   Critical Interventions:  foley    Problem List / ED Course:  Urinary retention:  Foley placed and drained over 1L of urine.  Pt feels much better.  He is d/c with foley in place.  He is to f/u with urology. CKD stage 5, Cr is slightly better than prior.  Pt is followed by  nephrology.    Reevaluation:  After the interventions noted above, I reevaluated the patient and found that they have :improved   Social Determinants of Health:  Lives at home   Dispostion:  After consideration of the diagnostic results and the patients response to treatment, I feel that the patent would benefit from discharge with outpatient f/u.          Final Clinical Impression(s) / ED Diagnoses Final diagnoses:  Urinary retention  Stage 5 chronic kidney disease not on chronic dialysis [facility])    Rx / DC Orders ED Discharge Orders          Ordered    HYDROcodone-acetaminophen (NORCO/VICODIN) 5-325 MG tablet  Every 4 hours PRN        11/01/21 2130              Isla Pence, MD 11/01/21 2131

## 2021-11-01 NOTE — ED Triage Notes (Signed)
Pt had a foley removed yesterday and has only been able to dribble urine since then.

## 2021-11-03 LAB — URINE CULTURE: Culture: NO GROWTH

## 2021-11-05 ENCOUNTER — Encounter (HOSPITAL_COMMUNITY): Payer: No Typology Code available for payment source

## 2022-01-14 ENCOUNTER — Encounter (HOSPITAL_COMMUNITY)
Admission: RE | Admit: 2022-01-14 | Discharge: 2022-01-14 | Disposition: A | Discharge status: Home / Self Care | Payer: No Typology Code available for payment source | Source: Ambulatory Visit | Attending: Nephrology | Admitting: Nephrology

## 2022-01-14 VITALS — BP 151/94 | HR 75 | Temp 97.4°F | Resp 16

## 2022-01-14 DIAGNOSIS — N179 Acute kidney failure, unspecified: Secondary | ICD-10-CM | POA: Insufficient documentation

## 2022-01-14 DIAGNOSIS — N189 Chronic kidney disease, unspecified: Secondary | ICD-10-CM | POA: Insufficient documentation

## 2022-01-14 LAB — RENAL FUNCTION PANEL
Albumin: 3.8 g/dL (ref 3.5–5.0)
Anion gap: 9 (ref 5–15)
BUN: 76 mg/dL — ABNORMAL HIGH (ref 6–20)
CO2: 18 mmol/L — ABNORMAL LOW (ref 22–32)
Calcium: 8.6 mg/dL — ABNORMAL LOW (ref 8.9–10.3)
Chloride: 110 mmol/L (ref 98–111)
Creatinine, Ser: 9.57 mg/dL — ABNORMAL HIGH (ref 0.61–1.24)
GFR, Estimated: 6 mL/min — ABNORMAL LOW (ref >60)
Glucose, Bld: 133 mg/dL — ABNORMAL HIGH (ref 70–99)
Phosphorus: 6 mg/dL — ABNORMAL HIGH (ref 2.5–4.6)
Potassium: 4.9 mmol/L (ref 3.5–5.1)
Sodium: 137 mmol/L (ref 135–145)

## 2022-01-14 LAB — IRON AND TIBC
Iron: 70 ug/dL (ref 45–182)
Saturation Ratios: 29 % (ref 17.9–39.5)
TIBC: 239 ug/dL — ABNORMAL LOW (ref 250–450)
UIBC: 169 ug/dL

## 2022-01-14 LAB — FERRITIN: Ferritin: 424 ng/mL — ABNORMAL HIGH (ref 24–336)

## 2022-01-14 MED ORDER — EPOETIN ALFA-EPBX 10000 UNIT/ML IJ SOLN
20000.0000 [IU] | INTRAMUSCULAR | Status: DC
  Administered 2022-01-14: 20000 [IU] via SUBCUTANEOUS

## 2022-01-14 MED ORDER — EPOETIN ALFA-EPBX 10000 UNIT/ML IJ SOLN
INTRAMUSCULAR | Status: AC
  Filled 2022-01-14: qty 2

## 2022-01-14 NOTE — Progress Notes (Signed)
Spoke to [place] at [facility] who stated we should cancel second dose Feraheme order from June. (Patient missed second dose- was no show and in hospital).

## 2022-01-15 ENCOUNTER — Inpatient Hospital Stay (HOSPITAL_COMMUNITY): Admission: RE | Admit: 2022-01-15 | Payer: No Typology Code available for payment source | Source: Ambulatory Visit

## 2022-01-15 LAB — PTH, INTACT AND CALCIUM
Calcium, Total (PTH): 8.6 mg/dL — ABNORMAL LOW (ref 8.7–10.2)
PTH: 12 pg/mL — ABNORMAL LOW (ref 15–65)

## 2022-01-15 LAB — POCT HEMOGLOBIN-HEMACUE: Hemoglobin: 9.9 g/dL — ABNORMAL LOW (ref 13.0–17.0)

## 2022-01-26 ENCOUNTER — Other Ambulatory Visit (HOSPITAL_COMMUNITY): Payer: Self-pay | Admitting: *Deleted

## 2022-01-29 ENCOUNTER — Encounter (HOSPITAL_COMMUNITY)
Admission: RE | Admit: 2022-01-29 | Discharge: 2022-01-29 | Disposition: A | Discharge status: Home / Self Care | Payer: No Typology Code available for payment source | Source: Ambulatory Visit | Attending: Nephrology | Admitting: Nephrology

## 2022-01-29 VITALS — BP 155/89 | HR 77 | Temp 97.2°F

## 2022-01-29 DIAGNOSIS — N179 Acute kidney failure, unspecified: Secondary | ICD-10-CM | POA: Diagnosis not present

## 2022-01-29 LAB — POCT HEMOGLOBIN-HEMACUE: Hemoglobin: 10.3 g/dL — ABNORMAL LOW (ref 13.0–17.0)

## 2022-01-29 MED ORDER — EPOETIN ALFA-EPBX 10000 UNIT/ML IJ SOLN
20000.0000 [IU] | INTRAMUSCULAR | Status: DC
  Administered 2022-01-29: 20000 [IU] via SUBCUTANEOUS

## 2022-01-29 MED ORDER — EPOETIN ALFA-EPBX 10000 UNIT/ML IJ SOLN
INTRAMUSCULAR | Status: AC
  Filled 2022-01-29: qty 2

## 2022-02-12 ENCOUNTER — Encounter (HOSPITAL_COMMUNITY)
Admission: RE | Admit: 2022-02-12 | Discharge: 2022-02-12 | Disposition: A | Discharge status: Home / Self Care | Payer: No Typology Code available for payment source | Source: Ambulatory Visit | Attending: Nephrology | Admitting: Nephrology

## 2022-02-12 VITALS — BP 167/106 | HR 78 | Temp 97.6°F | Resp 16 | Wt 170.0 lb

## 2022-02-12 DIAGNOSIS — N189 Chronic kidney disease, unspecified: Secondary | ICD-10-CM | POA: Diagnosis present

## 2022-02-12 DIAGNOSIS — N179 Acute kidney failure, unspecified: Secondary | ICD-10-CM | POA: Diagnosis present

## 2022-02-12 LAB — RENAL FUNCTION PANEL
Albumin: 3.8 g/dL (ref 3.5–5.0)
Anion gap: 11 (ref 5–15)
BUN: 83 mg/dL — ABNORMAL HIGH (ref 6–20)
CO2: 17 mmol/L — ABNORMAL LOW (ref 22–32)
Calcium: 8.4 mg/dL — ABNORMAL LOW (ref 8.9–10.3)
Chloride: 110 mmol/L (ref 98–111)
Creatinine, Ser: 9.11 mg/dL — ABNORMAL HIGH (ref 0.61–1.24)
GFR, Estimated: 6 mL/min — ABNORMAL LOW (ref >60)
Glucose, Bld: 119 mg/dL — ABNORMAL HIGH (ref 70–99)
Phosphorus: 6.6 mg/dL — ABNORMAL HIGH (ref 2.5–4.6)
Potassium: 5.4 mmol/L — ABNORMAL HIGH (ref 3.5–5.1)
Sodium: 138 mmol/L (ref 135–145)

## 2022-02-12 LAB — IRON AND TIBC
Iron: 77 ug/dL (ref 45–182)
Saturation Ratios: 34 % (ref 17.9–39.5)
TIBC: 224 ug/dL — ABNORMAL LOW (ref 250–450)
UIBC: 147 ug/dL

## 2022-02-12 LAB — FERRITIN: Ferritin: 293 ng/mL (ref 24–336)

## 2022-02-12 LAB — POCT HEMOGLOBIN-HEMACUE: Hemoglobin: 10.8 g/dL — ABNORMAL LOW (ref 13.0–17.0)

## 2022-02-12 MED ORDER — SODIUM CHLORIDE 0.9 % IV SOLN
510.0000 mg | INTRAVENOUS | Status: DC
  Administered 2022-02-12: 510 mg via INTRAVENOUS
  Filled 2022-02-12: qty 510

## 2022-02-12 MED ORDER — EPOETIN ALFA-EPBX 10000 UNIT/ML IJ SOLN
INTRAMUSCULAR | Status: AC
  Filled 2022-02-12: qty 2

## 2022-02-12 MED ORDER — EPOETIN ALFA-EPBX 10000 UNIT/ML IJ SOLN
20000.0000 [IU] | INTRAMUSCULAR | Status: DC
  Administered 2022-02-12: 20000 [IU] via SUBCUTANEOUS

## 2022-02-13 LAB — PTH, INTACT AND CALCIUM
Calcium, Total (PTH): 8.2 mg/dL — ABNORMAL LOW (ref 8.7–10.2)
PTH: 360 pg/mL — ABNORMAL HIGH (ref 15–65)

## 2022-02-26 ENCOUNTER — Encounter (HOSPITAL_COMMUNITY)
Admission: RE | Admit: 2022-02-26 | Discharge: 2022-02-26 | Disposition: A | Discharge status: Home / Self Care | Payer: No Typology Code available for payment source | Source: Ambulatory Visit | Attending: Nephrology | Admitting: Nephrology

## 2022-02-26 VITALS — BP 175/105 | HR 66 | Temp 97.7°F | Resp 16

## 2022-02-26 DIAGNOSIS — N179 Acute kidney failure, unspecified: Secondary | ICD-10-CM

## 2022-02-26 LAB — POCT HEMOGLOBIN-HEMACUE: Hemoglobin: 11.3 g/dL — ABNORMAL LOW (ref 13.0–17.0)

## 2022-02-26 MED ORDER — CLONIDINE HCL 0.1 MG PO TABS
0.1000 mg | ORAL_TABLET | Freq: Once | ORAL | Status: AC
  Administered 2022-02-26: 0.1 mg via ORAL

## 2022-02-26 MED ORDER — SODIUM CHLORIDE 0.9 % IV SOLN
510.0000 mg | INTRAVENOUS | Status: AC
  Administered 2022-02-26: 510 mg via INTRAVENOUS
  Filled 2022-02-26: qty 510

## 2022-02-26 MED ORDER — EPOETIN ALFA-EPBX 10000 UNIT/ML IJ SOLN
INTRAMUSCULAR | Status: AC
  Filled 2022-02-26: qty 2

## 2022-02-26 MED ORDER — EPOETIN ALFA-EPBX 10000 UNIT/ML IJ SOLN
20000.0000 [IU] | INTRAMUSCULAR | Status: DC
  Administered 2022-02-26: 20000 [IU] via SUBCUTANEOUS

## 2022-02-26 MED ORDER — CLONIDINE HCL 0.1 MG PO TABS
ORAL_TABLET | ORAL | Status: AC
  Filled 2022-02-26: qty 1

## 2022-02-26 NOTE — Progress Notes (Signed)
Pt in infusion clinic for IV feraheme and retacrit injection. Blood pressure high 180/111 and did not come down within parameters with clonidine initially but after apporx 50 minutes it did get down to 175/105. Office aware of blood pressure issues. Pt instructed to be sure to take BP medication in plenty of time before arrival.

## 2022-03-12 ENCOUNTER — Encounter (HOSPITAL_COMMUNITY)
Admission: RE | Admit: 2022-03-12 | Discharge: 2022-03-12 | Disposition: A | Discharge status: Home / Self Care | Payer: No Typology Code available for payment source | Source: Ambulatory Visit | Attending: Nephrology | Admitting: Nephrology

## 2022-03-12 VITALS — BP 150/98 | HR 72 | Temp 97.3°F | Resp 17 | Wt 170.0 lb

## 2022-03-12 DIAGNOSIS — N179 Acute kidney failure, unspecified: Secondary | ICD-10-CM | POA: Diagnosis present

## 2022-03-12 DIAGNOSIS — N189 Chronic kidney disease, unspecified: Secondary | ICD-10-CM | POA: Diagnosis present

## 2022-03-12 LAB — RENAL FUNCTION PANEL
Albumin: 3.9 g/dL (ref 3.5–5.0)
Anion gap: 11 (ref 5–15)
BUN: 84 mg/dL — ABNORMAL HIGH (ref 6–20)
CO2: 15 mmol/L — ABNORMAL LOW (ref 22–32)
Calcium: 7.8 mg/dL — ABNORMAL LOW (ref 8.9–10.3)
Chloride: 110 mmol/L (ref 98–111)
Creatinine, Ser: 9.78 mg/dL — ABNORMAL HIGH (ref 0.61–1.24)
GFR, Estimated: 6 mL/min — ABNORMAL LOW (ref >60)
Glucose, Bld: 135 mg/dL — ABNORMAL HIGH (ref 70–99)
Phosphorus: 7.4 mg/dL — ABNORMAL HIGH (ref 2.5–4.6)
Potassium: 4.7 mmol/L (ref 3.5–5.1)
Sodium: 136 mmol/L (ref 135–145)

## 2022-03-12 LAB — IRON AND TIBC
Iron: 125 ug/dL (ref 45–182)
Saturation Ratios: 62 % — ABNORMAL HIGH (ref 17.9–39.5)
TIBC: 203 ug/dL — ABNORMAL LOW (ref 250–450)
UIBC: 78 ug/dL

## 2022-03-12 LAB — POCT HEMOGLOBIN-HEMACUE: Hemoglobin: 11.8 g/dL — ABNORMAL LOW (ref 13.0–17.0)

## 2022-03-12 LAB — FERRITIN: Ferritin: 472 ng/mL — ABNORMAL HIGH (ref 24–336)

## 2022-03-12 MED ORDER — EPOETIN ALFA-EPBX 10000 UNIT/ML IJ SOLN
20000.0000 [IU] | INTRAMUSCULAR | Status: DC
  Administered 2022-03-12: 20000 [IU] via SUBCUTANEOUS

## 2022-03-12 MED ORDER — EPOETIN ALFA-EPBX 10000 UNIT/ML IJ SOLN
INTRAMUSCULAR | Status: AC
  Filled 2022-03-12: qty 2

## 2022-03-13 LAB — PTH, INTACT AND CALCIUM
Calcium, Total (PTH): 7.9 mg/dL — ABNORMAL LOW (ref 8.7–10.2)
PTH: 297 pg/mL — ABNORMAL HIGH (ref 15–65)

## 2022-03-24 ENCOUNTER — Inpatient Hospital Stay (HOSPITAL_COMMUNITY)
Admission: RE | Admit: 2022-03-24 | Discharge: 2022-03-24 | Disposition: A | Discharge status: Home / Self Care | Payer: No Typology Code available for payment source | Source: Ambulatory Visit | Attending: Nephrology | Admitting: Nephrology

## 2022-03-25 ENCOUNTER — Encounter (HOSPITAL_COMMUNITY): Payer: No Typology Code available for payment source

## 2022-04-21 ENCOUNTER — Encounter (HOSPITAL_COMMUNITY): Payer: No Typology Code available for payment source

## 2022-05-21 ENCOUNTER — Encounter (HOSPITAL_COMMUNITY): Payer: Self-pay

## 2022-05-21 ENCOUNTER — Inpatient Hospital Stay (HOSPITAL_COMMUNITY): Admission: RE | Admit: 2022-05-21 | Payer: No Typology Code available for payment source | Source: Ambulatory Visit

## 2022-05-25 ENCOUNTER — Encounter (HOSPITAL_COMMUNITY)
Admission: RE | Admit: 2022-05-25 | Discharge: 2022-05-25 | Disposition: A | Discharge status: Home / Self Care | Payer: No Typology Code available for payment source | Source: Ambulatory Visit | Attending: Nephrology | Admitting: Nephrology

## 2022-05-25 VITALS — BP 162/107 | HR 86 | Temp 97.2°F | Resp 17

## 2022-05-25 DIAGNOSIS — N189 Chronic kidney disease, unspecified: Secondary | ICD-10-CM | POA: Diagnosis present

## 2022-05-25 DIAGNOSIS — N179 Acute kidney failure, unspecified: Secondary | ICD-10-CM | POA: Insufficient documentation

## 2022-05-25 LAB — RENAL FUNCTION PANEL
Albumin: 3.9 g/dL (ref 3.5–5.0)
Anion gap: 13 (ref 5–15)
BUN: 87 mg/dL — ABNORMAL HIGH (ref 6–20)
CO2: 17 mmol/L — ABNORMAL LOW (ref 22–32)
Calcium: 7.9 mg/dL — ABNORMAL LOW (ref 8.9–10.3)
Chloride: 106 mmol/L (ref 98–111)
Creatinine, Ser: 9.88 mg/dL — ABNORMAL HIGH (ref 0.61–1.24)
GFR, Estimated: 6 mL/min — ABNORMAL LOW (ref >60)
Glucose, Bld: 98 mg/dL (ref 70–99)
Phosphorus: 5.7 mg/dL — ABNORMAL HIGH (ref 2.5–4.6)
Potassium: 4.3 mmol/L (ref 3.5–5.1)
Sodium: 136 mmol/L (ref 135–145)

## 2022-05-25 LAB — IRON AND TIBC
Iron: 109 ug/dL (ref 45–182)
Saturation Ratios: 52 % — ABNORMAL HIGH (ref 17.9–39.5)
TIBC: 210 ug/dL — ABNORMAL LOW (ref 250–450)
UIBC: 101 ug/dL

## 2022-05-25 LAB — FERRITIN: Ferritin: 618 ng/mL — ABNORMAL HIGH (ref 24–336)

## 2022-05-25 MED ORDER — EPOETIN ALFA-EPBX 10000 UNIT/ML IJ SOLN: 20000.0000 [IU] | INTRAMUSCULAR | Status: DC

## 2022-05-25 MED ORDER — EPOETIN ALFA-EPBX 10000 UNIT/ML IJ SOLN
INTRAMUSCULAR | Status: AC
  Administered 2022-05-25: 20000 [IU] via SUBCUTANEOUS
  Filled 2022-05-25: qty 2

## 2022-05-26 LAB — PTH, INTACT AND CALCIUM
Calcium, Total (PTH): 7.6 mg/dL — ABNORMAL LOW (ref 8.7–10.2)
PTH: 385 pg/mL — ABNORMAL HIGH (ref 15–65)

## 2022-05-26 LAB — POCT HEMOGLOBIN-HEMACUE: Hemoglobin: 8.5 g/dL — ABNORMAL LOW (ref 13.0–17.0)

## 2022-06-18 ENCOUNTER — Encounter (HOSPITAL_COMMUNITY): Payer: No Typology Code available for payment source

## 2022-06-22 ENCOUNTER — Encounter (HOSPITAL_COMMUNITY)
Admission: RE | Admit: 2022-06-22 | Discharge: 2022-06-22 | Disposition: A | Discharge status: Home / Self Care | Payer: No Typology Code available for payment source | Source: Ambulatory Visit | Attending: Nephrology | Admitting: Nephrology

## 2022-06-22 VITALS — BP 170/109 | HR 84 | Temp 97.6°F | Resp 18

## 2022-06-22 DIAGNOSIS — N179 Acute kidney failure, unspecified: Secondary | ICD-10-CM | POA: Insufficient documentation

## 2022-06-22 DIAGNOSIS — N189 Chronic kidney disease, unspecified: Secondary | ICD-10-CM | POA: Diagnosis present

## 2022-06-22 LAB — RENAL FUNCTION PANEL
Albumin: 3.8 g/dL (ref 3.5–5.0)
Anion gap: 13 (ref 5–15)
BUN: 95 mg/dL — ABNORMAL HIGH (ref 6–20)
CO2: 16 mmol/L — ABNORMAL LOW (ref 22–32)
Calcium: 8.2 mg/dL — ABNORMAL LOW (ref 8.9–10.3)
Chloride: 107 mmol/L (ref 98–111)
Creatinine, Ser: 10.83 mg/dL — ABNORMAL HIGH (ref 0.61–1.24)
GFR, Estimated: 5 mL/min — ABNORMAL LOW (ref >60)
Glucose, Bld: 152 mg/dL — ABNORMAL HIGH (ref 70–99)
Phosphorus: 6.2 mg/dL — ABNORMAL HIGH (ref 2.5–4.6)
Potassium: 5 mmol/L (ref 3.5–5.1)
Sodium: 136 mmol/L (ref 135–145)

## 2022-06-22 LAB — IRON AND TIBC
Iron: 93 ug/dL (ref 45–182)
Saturation Ratios: 45 % — ABNORMAL HIGH (ref 17.9–39.5)
TIBC: 207 ug/dL — ABNORMAL LOW (ref 250–450)
UIBC: 114 ug/dL

## 2022-06-22 LAB — FERRITIN: Ferritin: 676 ng/mL — ABNORMAL HIGH (ref 24–336)

## 2022-06-22 LAB — POCT HEMOGLOBIN-HEMACUE: Hemoglobin: 9.5 g/dL — ABNORMAL LOW (ref 13.0–17.0)

## 2022-06-22 MED ORDER — EPOETIN ALFA-EPBX 10000 UNIT/ML IJ SOLN: 20000.0000 [IU] | INTRAMUSCULAR | Status: DC

## 2022-06-22 MED ORDER — EPOETIN ALFA-EPBX 10000 UNIT/ML IJ SOLN
INTRAMUSCULAR | Status: AC
  Administered 2022-06-22: 20000 [IU] via SUBCUTANEOUS
  Filled 2022-06-22: qty 2

## 2022-06-23 LAB — PTH, INTACT AND CALCIUM
Calcium, Total (PTH): 7.9 mg/dL — ABNORMAL LOW (ref 8.7–10.2)
PTH: 238 pg/mL — ABNORMAL HIGH (ref 15–65)

## 2022-07-20 ENCOUNTER — Encounter (HOSPITAL_COMMUNITY)
Admission: RE | Admit: 2022-07-20 | Discharge: 2022-07-20 | Disposition: A | Discharge status: Home / Self Care | Payer: No Typology Code available for payment source | Source: Ambulatory Visit | Attending: Nephrology | Admitting: Nephrology

## 2022-07-20 VITALS — BP 175/109 | HR 82 | Temp 97.1°F | Resp 17

## 2022-07-20 DIAGNOSIS — N179 Acute kidney failure, unspecified: Secondary | ICD-10-CM

## 2022-07-20 DIAGNOSIS — N189 Chronic kidney disease, unspecified: Secondary | ICD-10-CM | POA: Diagnosis present

## 2022-07-20 LAB — RENAL FUNCTION PANEL
Albumin: 4.1 g/dL (ref 3.5–5.0)
Anion gap: 15 (ref 5–15)
BUN: 110 mg/dL — ABNORMAL HIGH (ref 6–20)
CO2: 18 mmol/L — ABNORMAL LOW (ref 22–32)
Calcium: 8.1 mg/dL — ABNORMAL LOW (ref 8.9–10.3)
Chloride: 103 mmol/L (ref 98–111)
Creatinine, Ser: 12.96 mg/dL — ABNORMAL HIGH (ref 0.61–1.24)
GFR, Estimated: 4 mL/min — ABNORMAL LOW (ref >60)
Glucose, Bld: 79 mg/dL (ref 70–99)
Phosphorus: 7 mg/dL — ABNORMAL HIGH (ref 2.5–4.6)
Potassium: 4.9 mmol/L (ref 3.5–5.1)
Sodium: 136 mmol/L (ref 135–145)

## 2022-07-20 LAB — FERRITIN: Ferritin: 839 ng/mL — ABNORMAL HIGH (ref 24–336)

## 2022-07-20 LAB — IRON AND TIBC
Iron: 110 ug/dL (ref 45–182)
Saturation Ratios: 45 % — ABNORMAL HIGH (ref 17.9–39.5)
TIBC: 242 ug/dL — ABNORMAL LOW (ref 250–450)
UIBC: 132 ug/dL

## 2022-07-20 LAB — POCT HEMOGLOBIN-HEMACUE: Hemoglobin: 9.4 g/dL — ABNORMAL LOW (ref 13.0–17.0)

## 2022-07-20 MED ORDER — EPOETIN ALFA-EPBX 10000 UNIT/ML IJ SOLN
20000.0000 [IU] | INTRAMUSCULAR | Status: DC
  Administered 2022-07-20: 20000 [IU] via SUBCUTANEOUS

## 2022-07-20 MED ORDER — EPOETIN ALFA-EPBX 10000 UNIT/ML IJ SOLN
INTRAMUSCULAR | Status: AC
  Filled 2022-07-20: qty 2

## 2022-07-21 LAB — PTH, INTACT AND CALCIUM
Calcium, Total (PTH): 8 mg/dL — ABNORMAL LOW (ref 8.7–10.2)
PTH: 544 pg/mL — ABNORMAL HIGH (ref 15–65)

## 2022-08-17 ENCOUNTER — Encounter (HOSPITAL_COMMUNITY)
Admission: RE | Admit: 2022-08-17 | Discharge: 2022-08-17 | Disposition: A | Discharge status: Home / Self Care | Payer: No Typology Code available for payment source | Source: Ambulatory Visit | Attending: Nephrology | Admitting: Nephrology

## 2022-08-17 VITALS — BP 168/105 | HR 80 | Temp 97.2°F | Resp 18

## 2022-08-17 DIAGNOSIS — N189 Chronic kidney disease, unspecified: Secondary | ICD-10-CM | POA: Insufficient documentation

## 2022-08-17 DIAGNOSIS — N179 Acute kidney failure, unspecified: Secondary | ICD-10-CM | POA: Diagnosis present

## 2022-08-17 LAB — RENAL FUNCTION PANEL
Albumin: 3.8 g/dL (ref 3.5–5.0)
Anion gap: 14 (ref 5–15)
BUN: 104 mg/dL — ABNORMAL HIGH (ref 6–20)
CO2: 15 mmol/L — ABNORMAL LOW (ref 22–32)
Calcium: 7.3 mg/dL — ABNORMAL LOW (ref 8.9–10.3)
Chloride: 104 mmol/L (ref 98–111)
Creatinine, Ser: 11.7 mg/dL — ABNORMAL HIGH (ref 0.61–1.24)
GFR, Estimated: 5 mL/min — ABNORMAL LOW (ref >60)
Glucose, Bld: 130 mg/dL — ABNORMAL HIGH (ref 70–99)
Phosphorus: 7.1 mg/dL — ABNORMAL HIGH (ref 2.5–4.6)
Potassium: 4.3 mmol/L (ref 3.5–5.1)
Sodium: 133 mmol/L — ABNORMAL LOW (ref 135–145)

## 2022-08-17 LAB — IRON AND TIBC
Iron: 103 ug/dL (ref 45–182)
Saturation Ratios: 44 % — ABNORMAL HIGH (ref 17.9–39.5)
TIBC: 232 ug/dL — ABNORMAL LOW (ref 250–450)
UIBC: 129 ug/dL

## 2022-08-17 LAB — FERRITIN: Ferritin: 647 ng/mL — ABNORMAL HIGH (ref 24–336)

## 2022-08-17 LAB — POCT HEMOGLOBIN-HEMACUE: Hemoglobin: 8.8 g/dL — ABNORMAL LOW (ref 13.0–17.0)

## 2022-08-17 LAB — HEPATITIS B SURFACE ANTIGEN: Hepatitis B Surface Ag: NONREACTIVE

## 2022-08-17 MED ORDER — EPOETIN ALFA-EPBX 10000 UNIT/ML IJ SOLN
20000.0000 [IU] | INTRAMUSCULAR | Status: DC
  Administered 2022-08-17: 20000 [IU] via SUBCUTANEOUS

## 2022-08-17 MED ORDER — EPOETIN ALFA-EPBX 10000 UNIT/ML IJ SOLN
INTRAMUSCULAR | Status: AC
  Filled 2022-08-17: qty 2

## 2022-08-19 LAB — PTH, INTACT AND CALCIUM
Calcium, Total (PTH): 7.2 mg/dL — ABNORMAL LOW (ref 8.7–10.2)
PTH: 350 pg/mL — ABNORMAL HIGH (ref 15–65)

## 2022-08-31 ENCOUNTER — Encounter (HOSPITAL_COMMUNITY): Payer: No Typology Code available for payment source

## 2022-09-14 ENCOUNTER — Encounter (HOSPITAL_COMMUNITY): Payer: No Typology Code available for payment source

## 2022-10-08 ENCOUNTER — Emergency Department (HOSPITAL_COMMUNITY): Payer: No Typology Code available for payment source

## 2022-10-08 ENCOUNTER — Other Ambulatory Visit: Payer: Self-pay

## 2022-10-08 ENCOUNTER — Encounter (HOSPITAL_COMMUNITY): Payer: Self-pay

## 2022-10-08 ENCOUNTER — Emergency Department (HOSPITAL_COMMUNITY)
Admission: EM | Admit: 2022-10-08 | Discharge: 2022-10-08 | Discharge status: Against Medical Advice | Payer: No Typology Code available for payment source | Attending: Emergency Medicine | Admitting: Emergency Medicine

## 2022-10-08 DIAGNOSIS — R0602 Shortness of breath: Secondary | ICD-10-CM | POA: Diagnosis not present

## 2022-10-08 DIAGNOSIS — W19XXXA Unspecified fall, initial encounter: Secondary | ICD-10-CM | POA: Insufficient documentation

## 2022-10-08 DIAGNOSIS — R42 Dizziness and giddiness: Secondary | ICD-10-CM | POA: Diagnosis present

## 2022-10-08 DIAGNOSIS — R531 Weakness: Secondary | ICD-10-CM | POA: Diagnosis not present

## 2022-10-08 DIAGNOSIS — R112 Nausea with vomiting, unspecified: Secondary | ICD-10-CM | POA: Diagnosis not present

## 2022-10-08 DIAGNOSIS — Z5321 Procedure and treatment not carried out due to patient leaving prior to being seen by health care provider: Secondary | ICD-10-CM | POA: Diagnosis not present

## 2022-10-08 LAB — CBC WITH DIFFERENTIAL/PLATELET
Abs Immature Granulocytes: 0.02 10*3/uL (ref 0.00–0.07)
Basophils Absolute: 0.1 10*3/uL (ref 0.0–0.1)
Basophils Relative: 1 %
Eosinophils Absolute: 0.6 10*3/uL — ABNORMAL HIGH (ref 0.0–0.5)
Eosinophils Relative: 7 %
HCT: 31.4 % — ABNORMAL LOW (ref 39.0–52.0)
Hemoglobin: 10.1 g/dL — ABNORMAL LOW (ref 13.0–17.0)
Immature Granulocytes: 0 %
Lymphocytes Relative: 15 %
Lymphs Abs: 1.3 10*3/uL (ref 0.7–4.0)
MCH: 28.5 pg (ref 26.0–34.0)
MCHC: 32.2 g/dL (ref 30.0–36.0)
MCV: 88.7 fL (ref 80.0–100.0)
Monocytes Absolute: 0.6 10*3/uL (ref 0.1–1.0)
Monocytes Relative: 7 %
Neutro Abs: 6 10*3/uL (ref 1.7–7.7)
Neutrophils Relative %: 70 %
Platelets: 202 10*3/uL (ref 150–400)
RBC: 3.54 MIL/uL — ABNORMAL LOW (ref 4.22–5.81)
RDW: 16.5 % — ABNORMAL HIGH (ref 11.5–15.5)
WBC: 8.6 10*3/uL (ref 4.0–10.5)
nRBC: 0 % (ref 0.0–0.2)

## 2022-10-08 LAB — BASIC METABOLIC PANEL
Anion gap: 14 (ref 5–15)
BUN: 39 mg/dL — ABNORMAL HIGH (ref 6–20)
CO2: 24 mmol/L (ref 22–32)
Calcium: 8.4 mg/dL — ABNORMAL LOW (ref 8.9–10.3)
Chloride: 96 mmol/L — ABNORMAL LOW (ref 98–111)
Creatinine, Ser: 10.1 mg/dL — ABNORMAL HIGH (ref 0.61–1.24)
GFR, Estimated: 6 mL/min — ABNORMAL LOW (ref >60)
Glucose, Bld: 89 mg/dL (ref 70–99)
Potassium: 4.2 mmol/L (ref 3.5–5.1)
Sodium: 134 mmol/L — ABNORMAL LOW (ref 135–145)

## 2022-10-08 LAB — BRAIN NATRIURETIC PEPTIDE: B Natriuretic Peptide: 250.3 pg/mL — ABNORMAL HIGH (ref 0.0–100.0)

## 2022-10-08 LAB — I-STAT CHEM 8, ED
BUN: 35 mg/dL — ABNORMAL HIGH (ref 6–20)
Calcium, Ion: 0.99 mmol/L — ABNORMAL LOW (ref 1.15–1.40)
Chloride: 97 mmol/L — ABNORMAL LOW (ref 98–111)
Creatinine, Ser: 11.2 mg/dL — ABNORMAL HIGH (ref 0.61–1.24)
Glucose, Bld: 92 mg/dL (ref 70–99)
HCT: 32 % — ABNORMAL LOW (ref 39.0–52.0)
Hemoglobin: 10.9 g/dL — ABNORMAL LOW (ref 13.0–17.0)
Potassium: 4.3 mmol/L (ref 3.5–5.1)
Sodium: 135 mmol/L (ref 135–145)
TCO2: 26 mmol/L (ref 22–32)

## 2022-10-08 NOTE — ED Triage Notes (Signed)
Pt came in via POV d/t n/v, weakness, dizziness & falling 4 times total today since he had dialysis this morning. A/Ox4, rates his overall pain 5/10 while in triage. Pt denies hitting his head when he fell & no injuries from any of the falls.

## 2022-10-08 NOTE — ED Provider Triage Note (Addendum)
Emergency Medicine Provider Triage Evaluation Note  Dylan Sweeney , a 52 y.o. male  was evaluated in triage.  Pt complains of dizziness, nausea, vomiting, fall, weak 30 min into dialysis tx today. Last full session on Tuesday. Minor SHOB. Given heparin today, does not usually get heparin, no known reason. Reports feels weak, fall. Has fallen 4 times today, has not hit head, no LOC. Changed from [place] to [place].  Had weight wrong on Tuesday and they took too much fluid off, left underweight.  Review of Systems  Positive:  Negative:   Physical Exam  BP (!) 158/111 (BP Location: Right Arm)   Pulse 82   Temp 98.2 F (36.8 C) (Oral)   Resp 17   SpO2 100%  Gen:   Awake, no distress   Resp:  Normal effort  MSK:   Moves extremities without difficulty  Other:    Medical Decision Making  Medically screening exam initiated at 6:00 PM.  Appropriate orders placed.  Dylan Sweeney was informed that the remainder of the evaluation will be completed by another provider, this initial triage assessment does not replace that evaluation, and the importance of remaining in the ED until their evaluation is complete.     Dylan Fend, PA-C 10/08/22 1801    Dylan Fend, PA-C 10/08/22 [phone]

## 2022-10-08 NOTE — ED Notes (Signed)
Pt left [facility] due to long ED wait times.  

## 2023-04-16 IMAGING — CT CT ABD-PELV W/O CM
2 of 4 series · 15 of 46 positions shown, 17 images · non-contrast
Comparison: 09/21/2018

CLINICAL DATA: Macroscopic hematuria.



[Series 3: abd/ pelvis 5.0 i30f 2 · axial · 0.88mm/px · z∈[+959,+1414]mm · 12 of 101 slices shown, 14 images]
[im 5/101  soft-tissue]
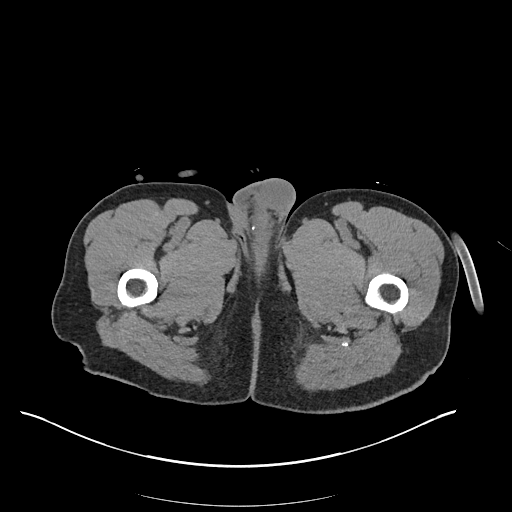
[im 5/101  bone]
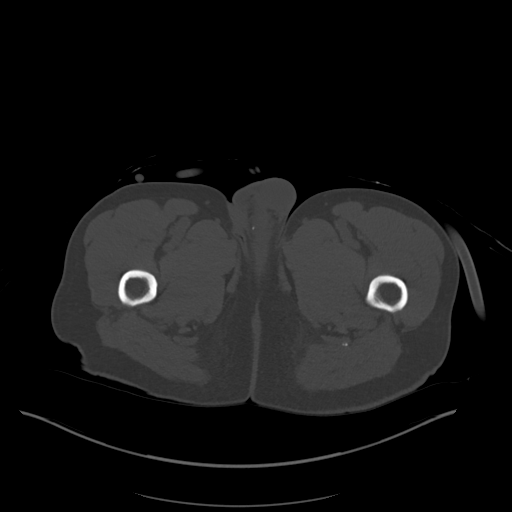
[im 13/101  soft-tissue]
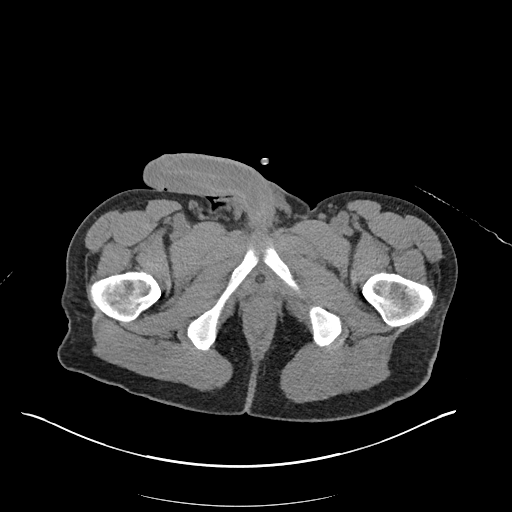
[im 21/101  soft-tissue]
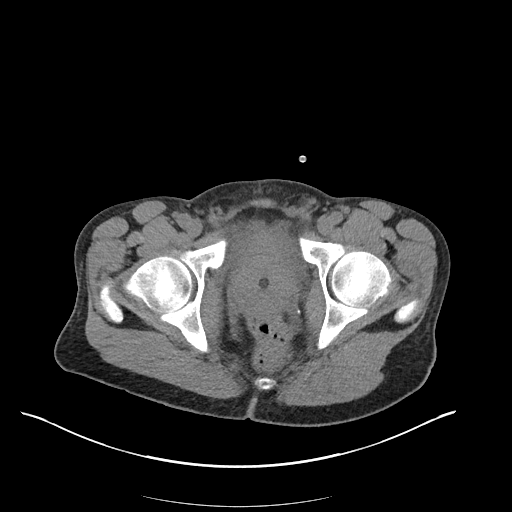
[im 30/101  soft-tissue]
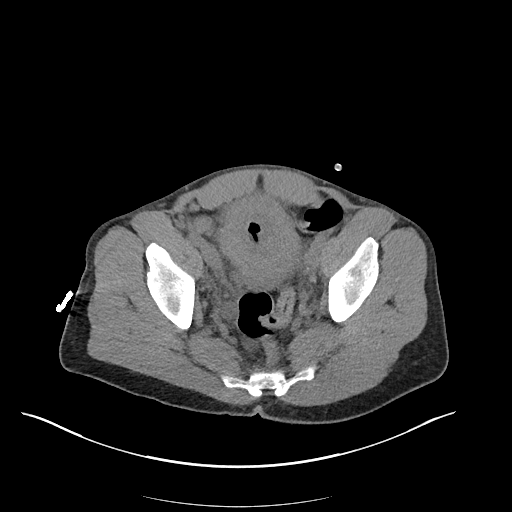
[im 38/101  soft-tissue]
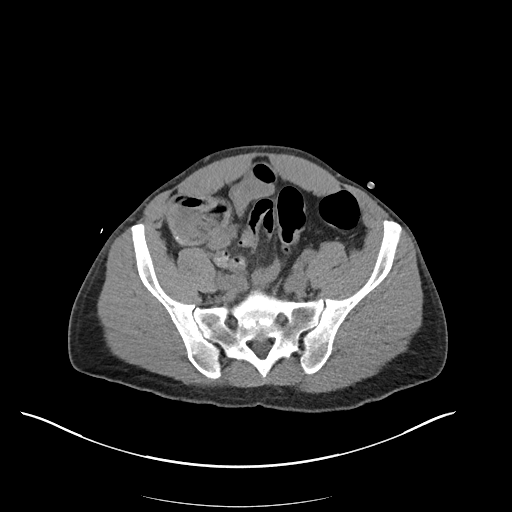
[im 46/101  soft-tissue]
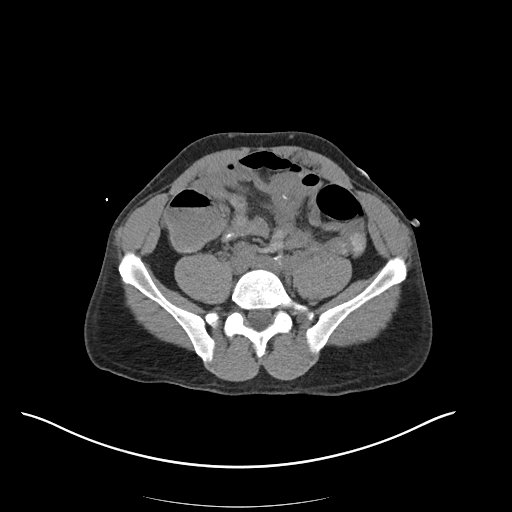
[im 55/101  soft-tissue]
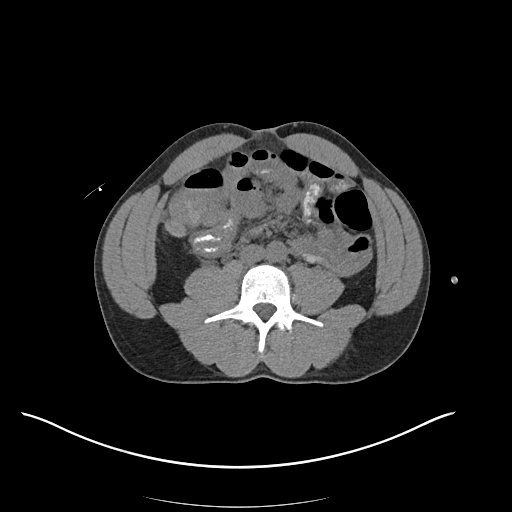
[im 63/101  soft-tissue]
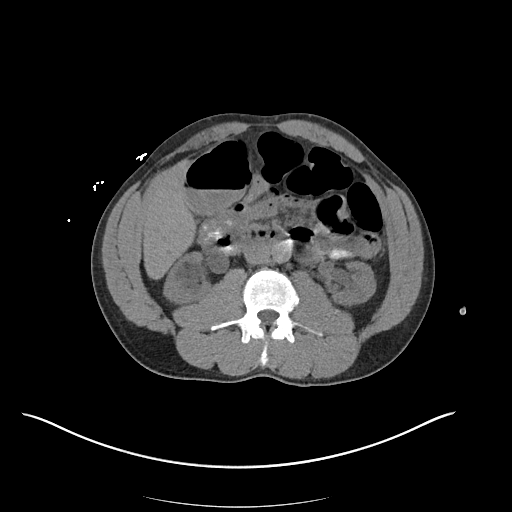
[im 71/101  soft-tissue]
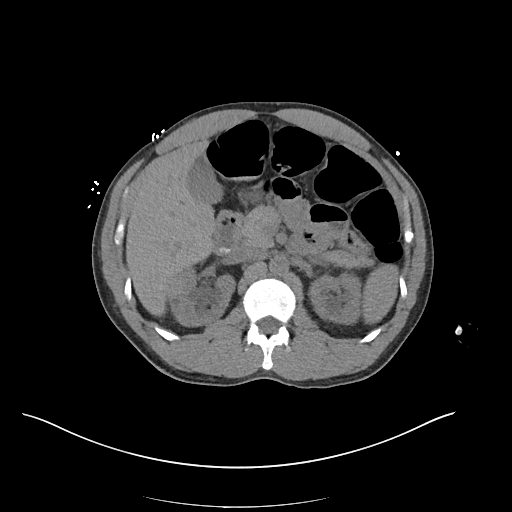
[im 71/101  bone]
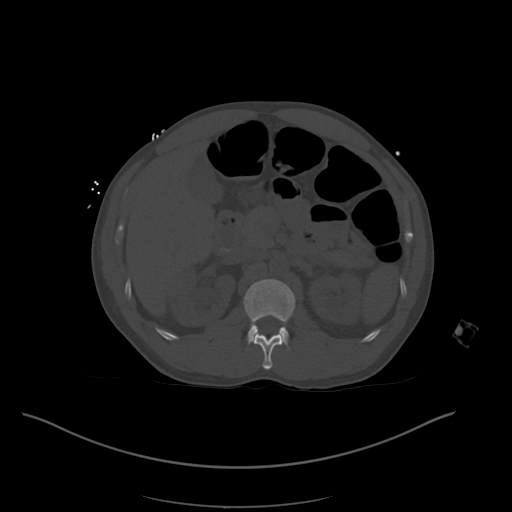
[im 80/101  soft-tissue]
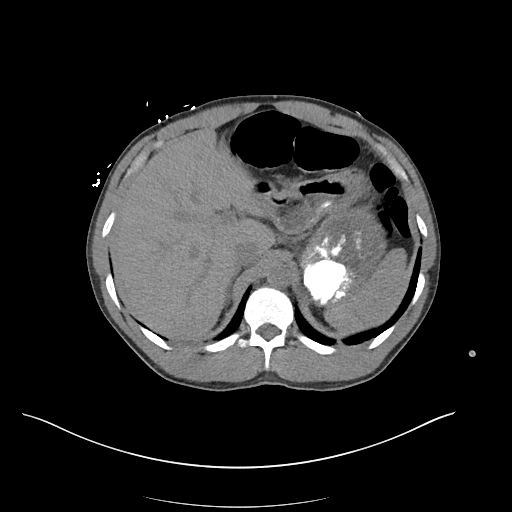
[im 88/101  soft-tissue]
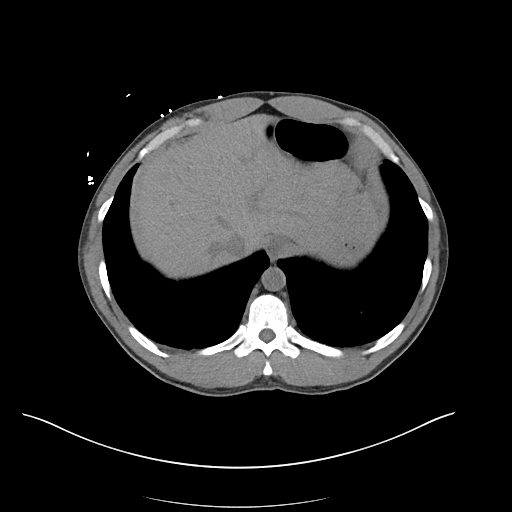
[im 96/101  soft-tissue]
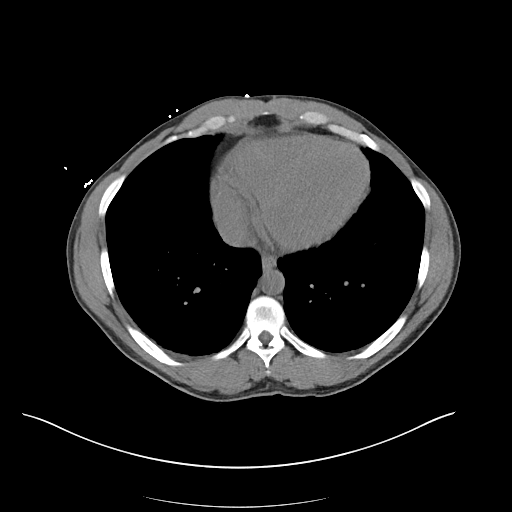

[Series 6: cor st · coronal · 0.68mm/px · 3 of 100 slices shown]
[im 34/100  soft-tissue]
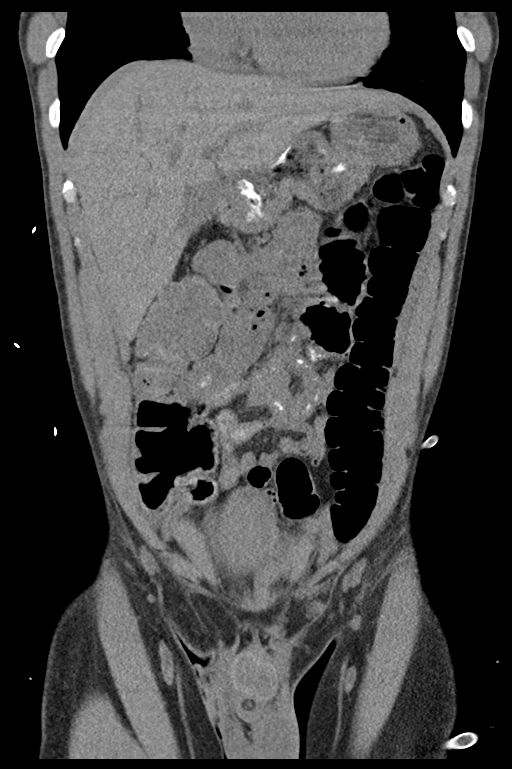
[im 45/100  soft-tissue]
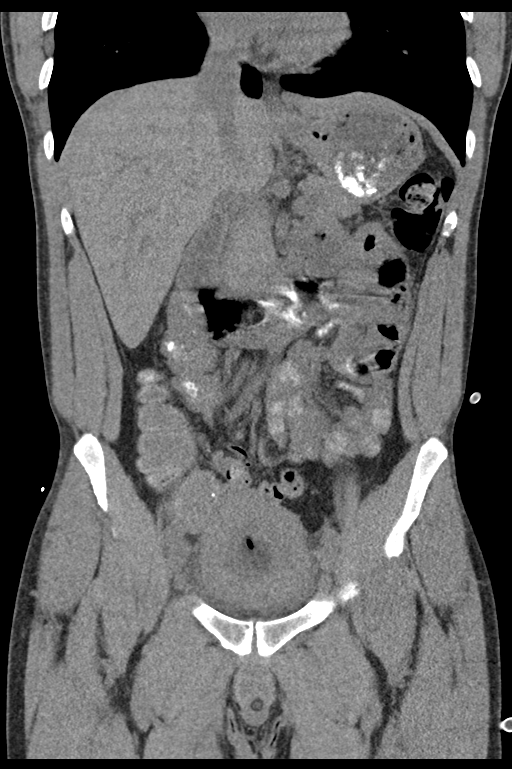
[im 56/100  soft-tissue]
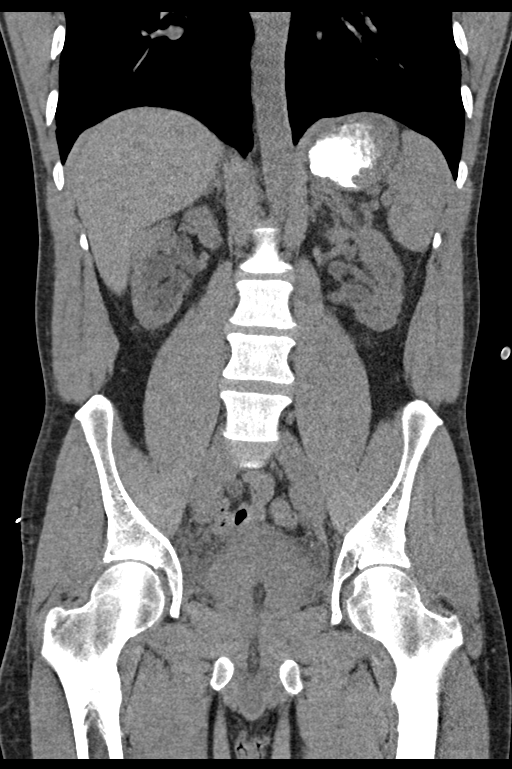

[15 of 46 positions shown; findings below may reference images not displayed]

FINDINGS: Lower chest: Lung bases are clear.

Hepatobiliary: No focal liver abnormality is seen. No gallstones,
gallbladder wall thickening, or biliary dilatation.

Pancreas: Unremarkable. No pancreatic ductal dilatation or
surrounding inflammatory changes.

Spleen: Normal in size without focal abnormality.

Adrenals/Urinary Tract: Normal adrenal glands.

Bilateral renal cortical thinning, greater on the left. Moderate
right and mild left hydronephrosis and hydroureter. No ureteral
stone. 5 mm low-attenuation lesion in the lateral midpole the right
kidney, too small to characterize, but consistent with a cyst. No
other renal masses. No intrarenal stones.

Thick-walled bladder is collapsed around a Foley catheter. There is
hazy inflammatory change in the surrounding perivesicular fat. No
bladder stone.

Stomach/Bowel: Normal stomach. Small bowel and colon are normal in
caliber. No wall thickening. No inflammation. Air-fluid levels noted
in the right colon, nonspecific. Normal appendix visualized.

Vascular/Lymphatic: No significant vascular findings are present. No
enlarged abdominal or pelvic lymph nodes.

Reproductive: Prostate not well-defined, grossly normal in size.

Other: Small fat containing umbilical hernia. Trace free fluid in
the posterior pelvic recess.

Musculoskeletal: No fracture or acute finding.  No bone lesion.
IMPRESSION: 1. Thick-walled bladder is collapsed around a Foley catheter with
surrounding, perivesicular inflammatory change. Moderate right and
mild left hydroureteronephrosis, without evidence of a ureteral
stone. This is presumably due to the bladder pathology. The bladder
may be diffusely inflamed, although neoplastic disease is also in
the differential diagnosis.
2. No other acute abnormality within the abdomen or pelvis.

## 2023-04-16 IMAGING — US US RENAL
1 series · 14 of 25 positions shown · non-contrast
Comparison: CT of the abdomen and pelvis on 09/21/2018, renal
ultrasound on 09/21/2018

CLINICAL DATA: Renal failure.

EXAM:
RENAL / URINARY TRACT ULTRASOUND COMPLETE

[Series 1: us renal · 14 of 63 slices shown]
[im 1/63]
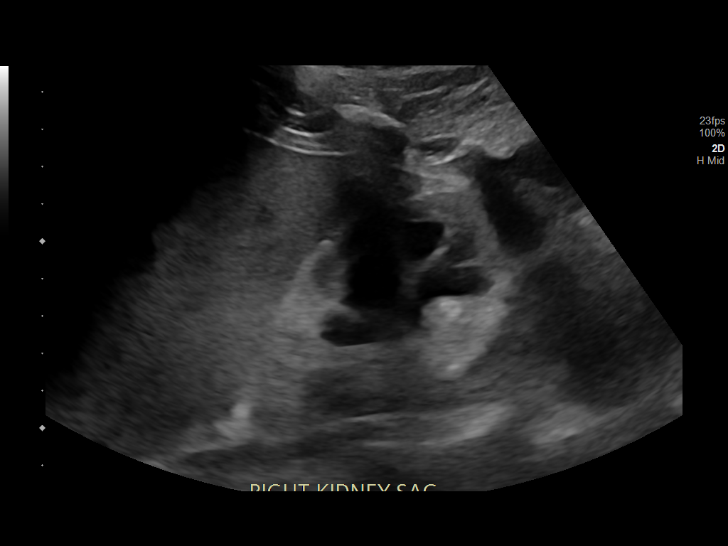
[im 6/63]
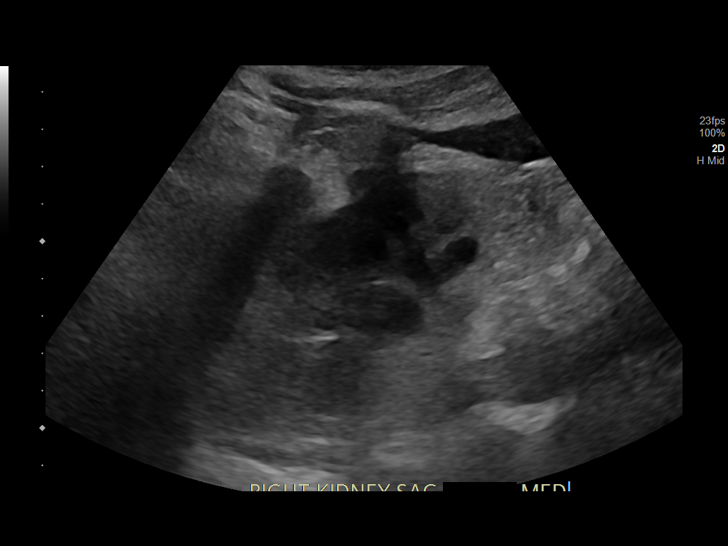
[im 11/63]
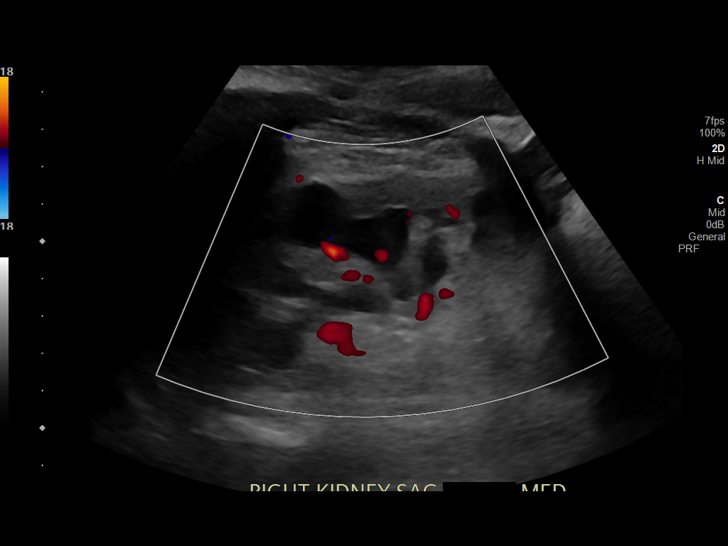
[im 16/63]
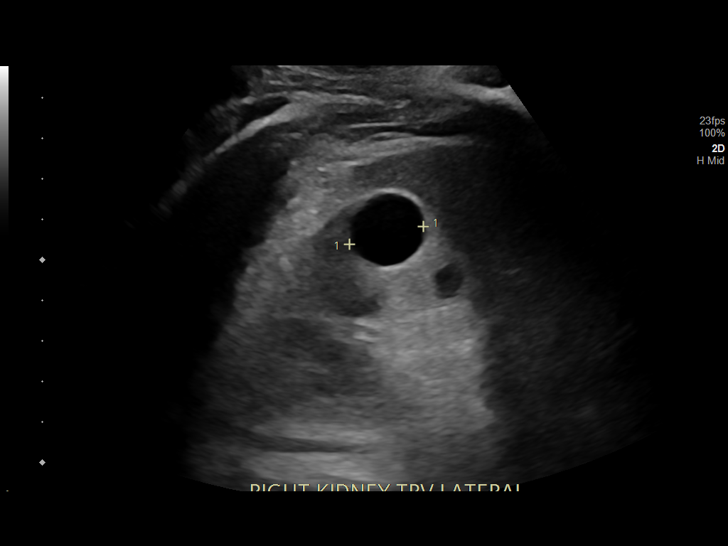
[im 21/63]
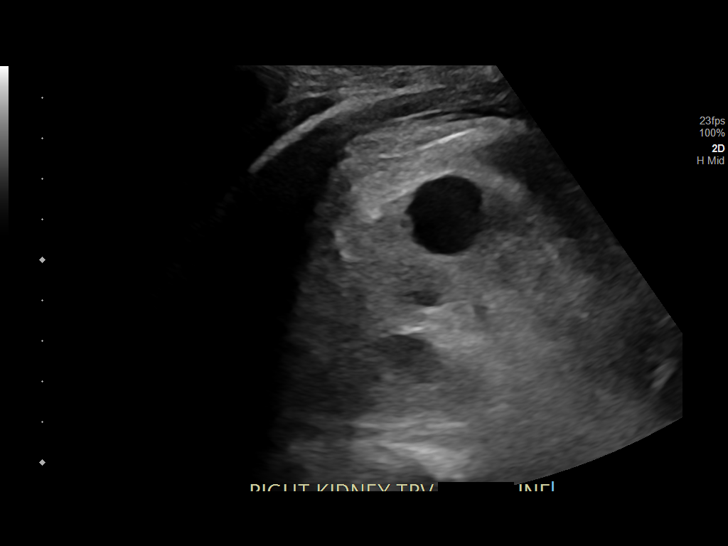
[im 24/63]
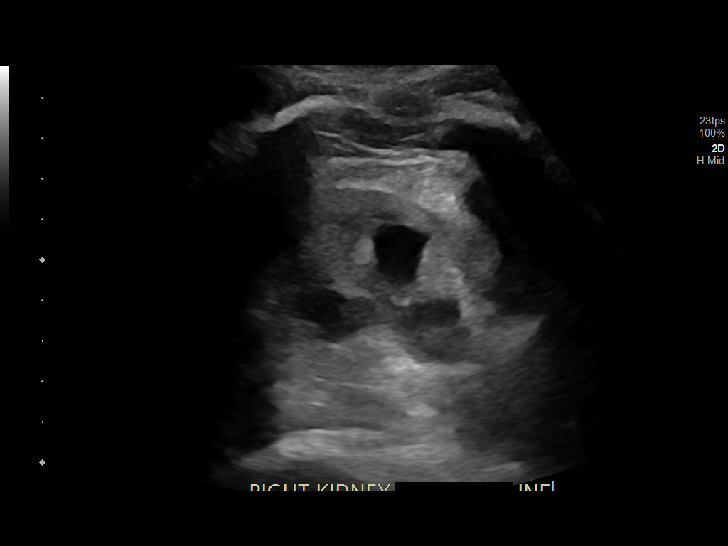
[im 29/63]
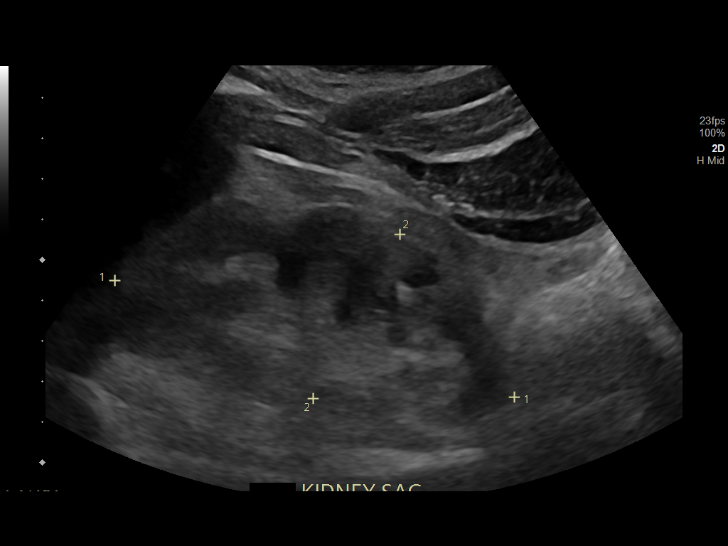
[im 34/63]
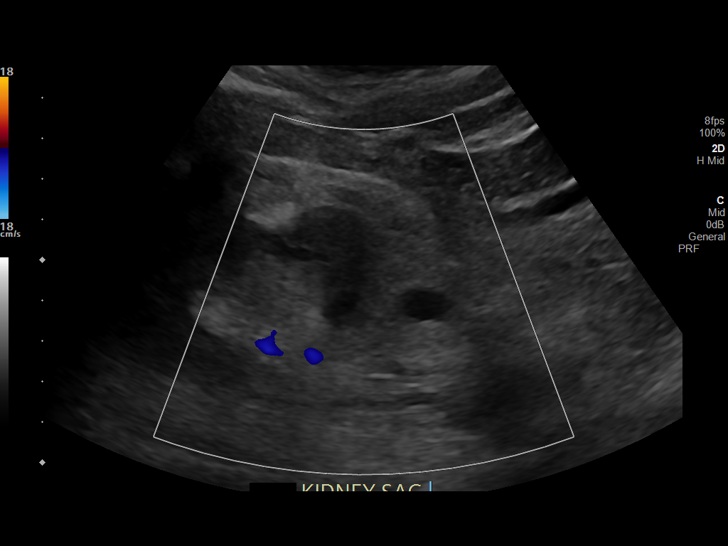
[im 39/63]
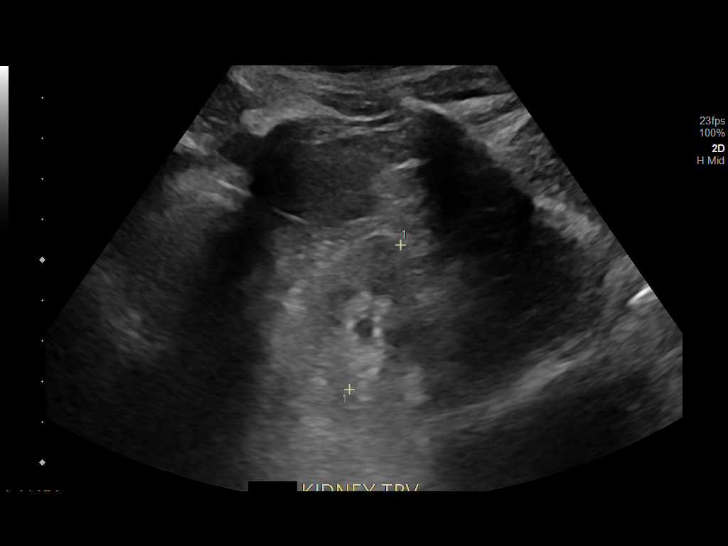
[im 42/63]
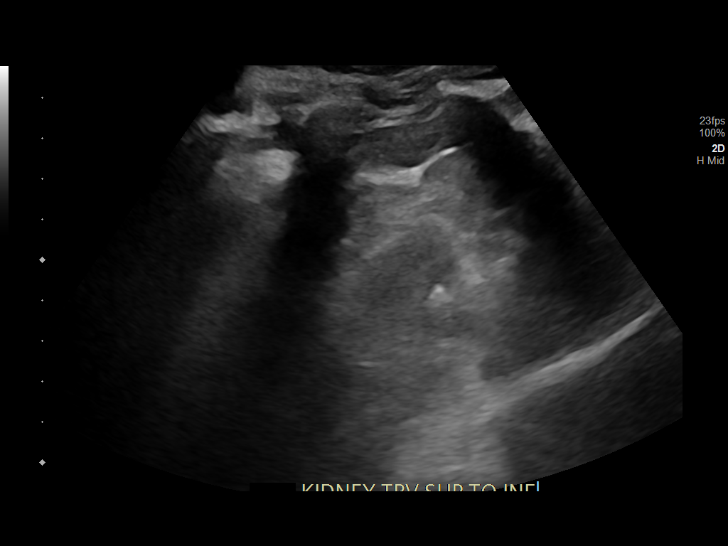
[im 47/63]
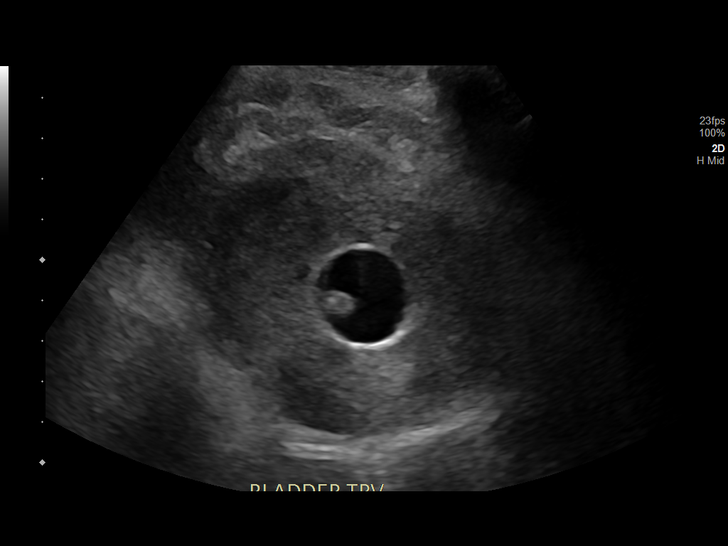
[im 52/63]
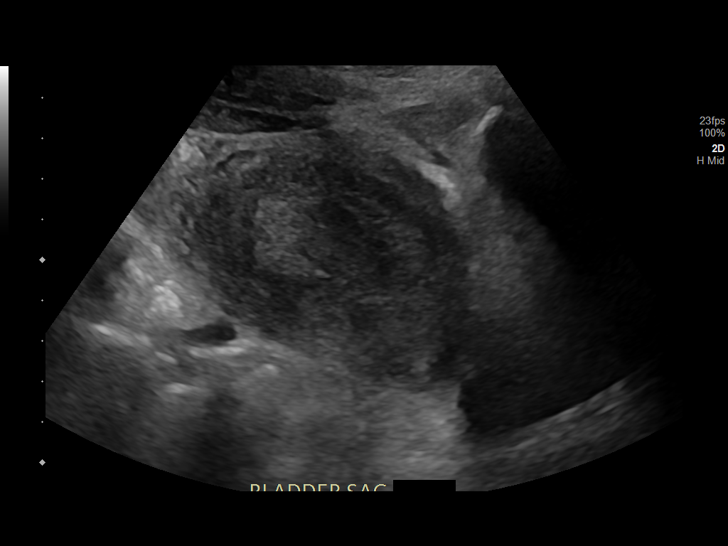
[im 57/63]
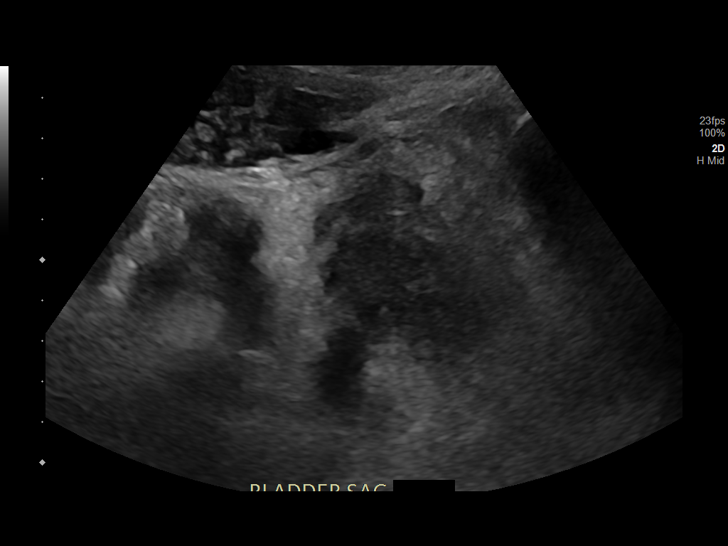
[im 63/63]
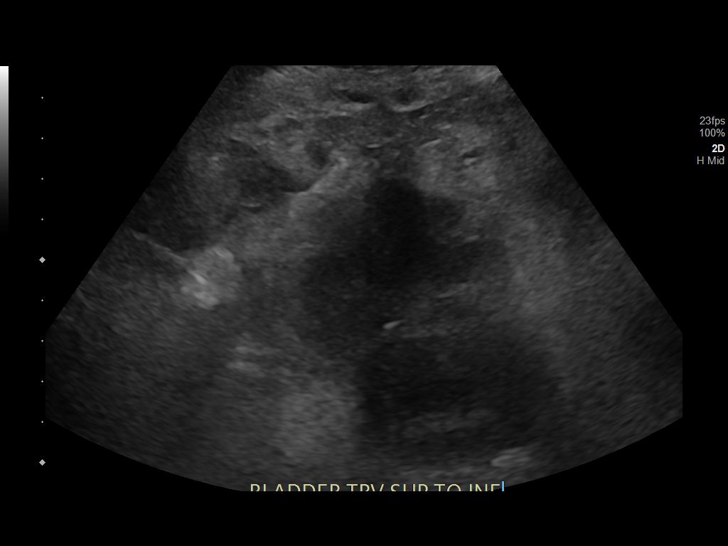

[14 of 25 positions shown; findings below may reference images not displayed]

FINDINGS: Right Kidney:

Renal measurements: 9.1 x 4.1 x 4.1 centimeters = volume: 78.3 mL.
Renal parenchyma is echogenic, similar to prior study. There is a
simple cyst in the UPPER pole region, measuring 2.0 x
centimeters. There has been interval development of moderate RIGHT
hydronephrosis.

Left Kidney:

Renal measurements: 10.3 x 4.6 x 3.8 centimeters = volume: 93.1 mL.
Renal parenchyma is echogenic, similar to prior study. Simple cyst
in the LOWER pole region is 1.7 x 1.7 x 1.5 centimeters. No
hydronephrosis.

Bladder:

A Foley catheter decompresses the urinary bladder. Urinary bladder
wall remains significantly thickened, measuring up to
centimeters.

Other:

None.
IMPRESSION: 1. Interval development of moderate RIGHT hydronephrosis. Recommend
further evaluation with CT of the abdomen and pelvis.
2. Echogenic kidneys bilaterally.
3. Bilateral simple renal cysts.
4. Persistent, significant thickening of the urinary bladder wall.

## 2023-07-05 ENCOUNTER — Encounter (HOSPITAL_COMMUNITY): Payer: Self-pay

## 2023-07-05 ENCOUNTER — Emergency Department (HOSPITAL_BASED_OUTPATIENT_CLINIC_OR_DEPARTMENT_OTHER)

## 2023-07-05 ENCOUNTER — Emergency Department (HOSPITAL_BASED_OUTPATIENT_CLINIC_OR_DEPARTMENT_OTHER)
Admission: EM | Admit: 2023-07-05 | Discharge: 2023-07-06 | Disposition: A | Discharge status: Home / Self Care | Attending: Emergency Medicine | Admitting: Emergency Medicine

## 2023-07-05 ENCOUNTER — Encounter (HOSPITAL_BASED_OUTPATIENT_CLINIC_OR_DEPARTMENT_OTHER): Payer: Self-pay

## 2023-07-05 DIAGNOSIS — R0602 Shortness of breath: Secondary | ICD-10-CM | POA: Diagnosis present

## 2023-07-05 DIAGNOSIS — J45901 Unspecified asthma with (acute) exacerbation: Secondary | ICD-10-CM | POA: Diagnosis present

## 2023-07-05 DIAGNOSIS — Z79899 Other long term (current) drug therapy: Secondary | ICD-10-CM | POA: Insufficient documentation

## 2023-07-05 DIAGNOSIS — Z992 Dependence on renal dialysis: Secondary | ICD-10-CM | POA: Insufficient documentation

## 2023-07-05 DIAGNOSIS — J9601 Acute respiratory failure with hypoxia: Secondary | ICD-10-CM | POA: Diagnosis not present

## 2023-07-05 DIAGNOSIS — N186 End stage renal disease: Secondary | ICD-10-CM | POA: Diagnosis not present

## 2023-07-05 DIAGNOSIS — J45909 Unspecified asthma, uncomplicated: Secondary | ICD-10-CM | POA: Diagnosis not present

## 2023-07-05 DIAGNOSIS — J101 Influenza due to other identified influenza virus with other respiratory manifestations: Secondary | ICD-10-CM | POA: Diagnosis not present

## 2023-07-05 LAB — RESP PANEL BY RT-PCR (RSV, FLU A&B, COVID)  RVPGX2
Influenza A by PCR: POSITIVE — AB
Influenza B by PCR: NEGATIVE
Resp Syncytial Virus by PCR: NEGATIVE
SARS Coronavirus 2 by RT PCR: NEGATIVE

## 2023-07-05 LAB — D-DIMER, QUANTITATIVE: D-Dimer, Quant: 0.7 ug{FEU}/mL — ABNORMAL HIGH (ref 0.00–0.50)

## 2023-07-05 LAB — CBC
HCT: 27.8 % — ABNORMAL LOW (ref 39.0–52.0)
Hemoglobin: 9.5 g/dL — ABNORMAL LOW (ref 13.0–17.0)
MCH: 30.6 pg (ref 26.0–34.0)
MCHC: 34.2 g/dL (ref 30.0–36.0)
MCV: 89.7 fL (ref 80.0–100.0)
Platelets: 272 10*3/uL (ref 150–400)
RBC: 3.1 MIL/uL — ABNORMAL LOW (ref 4.22–5.81)
RDW: 14.3 % (ref 11.5–15.5)
WBC: 9.4 10*3/uL (ref 4.0–10.5)
nRBC: 0 % (ref 0.0–0.2)

## 2023-07-05 LAB — BASIC METABOLIC PANEL
Anion gap: 15 (ref 5–15)
BUN: 45 mg/dL — ABNORMAL HIGH (ref 6–20)
CO2: 25 mmol/L (ref 22–32)
Calcium: 8.5 mg/dL — ABNORMAL LOW (ref 8.9–10.3)
Chloride: 95 mmol/L — ABNORMAL LOW (ref 98–111)
Creatinine, Ser: 11.32 mg/dL — ABNORMAL HIGH (ref 0.61–1.24)
GFR, Estimated: 5 mL/min — ABNORMAL LOW (ref >60)
Glucose, Bld: 93 mg/dL (ref 70–99)
Potassium: 4.1 mmol/L (ref 3.5–5.1)
Sodium: 135 mmol/L (ref 135–145)

## 2023-07-05 LAB — TROPONIN I (HIGH SENSITIVITY)
Troponin I (High Sensitivity): 12 ng/L (ref <18)
Troponin I (High Sensitivity): 11 ng/L (ref <18)

## 2023-07-05 MED ORDER — ALBUTEROL SULFATE (2.5 MG/3ML) 0.083% IN NEBU
2.5000 mg | INHALATION_SOLUTION | Freq: Once | RESPIRATORY_TRACT | Status: AC
  Administered 2023-07-05: 2.5 mg via RESPIRATORY_TRACT
  Filled 2023-07-05: qty 3

## 2023-07-05 MED ORDER — ONDANSETRON 4 MG PO TBDP
4.0000 mg | ORAL_TABLET | Freq: Once | ORAL | Status: AC
  Administered 2023-07-05: 4 mg via ORAL
  Filled 2023-07-05: qty 1

## 2023-07-05 MED ORDER — KETOROLAC TROMETHAMINE 30 MG/ML IJ SOLN: 30.0000 mg | Freq: Once | INTRAMUSCULAR | Status: DC

## 2023-07-05 MED ORDER — KETOROLAC TROMETHAMINE 15 MG/ML IJ SOLN
15.0000 mg | Freq: Once | INTRAMUSCULAR | Status: AC
  Administered 2023-07-05: 15 mg via INTRAVENOUS
  Filled 2023-07-05: qty 1

## 2023-07-05 MED ORDER — IBUPROFEN 800 MG PO TABS: 800.0000 mg | ORAL_TABLET | Freq: Once | ORAL | Status: DC

## 2023-07-05 MED ORDER — IPRATROPIUM-ALBUTEROL 0.5-2.5 (3) MG/3ML IN SOLN
3.0000 mL | Freq: Once | RESPIRATORY_TRACT | Status: AC
  Administered 2023-07-05: 3 mL via RESPIRATORY_TRACT
  Filled 2023-07-05: qty 3

## 2023-07-05 MED ORDER — OSELTAMIVIR PHOSPHATE 75 MG PO CAPS
75.0000 mg | ORAL_CAPSULE | Freq: Once | ORAL | Status: AC
Start: 2023-07-05 — End: 2023-07-06
  Administered 2023-07-06: 75 mg via ORAL
  Filled 2023-07-05: qty 1

## 2023-07-05 MED ORDER — ACETAMINOPHEN 325 MG PO TABS
650.0000 mg | ORAL_TABLET | Freq: Once | ORAL | Status: AC
  Administered 2023-07-05: 650 mg via ORAL
  Filled 2023-07-05: qty 2

## 2023-07-05 NOTE — ED Triage Notes (Signed)
 Pt c/o SHOB onset 5p "all the sudden." Cough x2 days, denies fevers at home. Endorses lightheaded episodes. Denies leg swelling. CP when coughing. States last time similar episode happened, he was in need of dialysis  Dialysis T, Th, Sat, full treatment Sat.

## 2023-07-05 NOTE — Plan of Care (Signed)
 Plan of Care Note for accepted transfer  Patient: Dylan Sweeney              KZS:[id number]  DOA: 07/05/2023     Facility requesting transfer: [facility] emergency department Requesting Provider: Dr. Wallace Cullens  Reason for transfer: Asthma exacerbation in the setting of influenza  Facility course: 53 year old man history of ESRD on dialysis TTS schedule, essential hypertension, anemia of chronic disease, sickle cell disease, asthma, obstructive sleep apnea and depression presented to emergency department complaining of cough, fever and shortness of breath.  Patient denies any nausea, vomiting abdominal pain and diarrhea.  Patient reported compliant with dialysis.  Presentation to ED patient is tachycardic tachypneic O2 sat 91 to 94% on room air and hypertensive. Respiratory panel positive with influenza A. BMP showing low bicarb and evidence of ESRD. Normal troponin. Slight elevated D-dimer 0.7. CBC stable H&H 9.5 and 27.8.  Pending chest x-ray result.  ED physician reported that patient has wheezing and in the ED patient has been treated with DuoNeb nebulizer and albuterol nebulizer.  Patient continues to have cough and shortness of breath.  Hospitalist has been consulted for further evaluation management of asthma exacerbation in the setting of influenza A infection.  Plan of care: The patient is accepted for admission for inpatient status to Telemetry unit, at [facility].  Check [email or web address] for on-call coverage.  [facility] will assume care on arrival to accepting facility. Until arrival, medical decision making responsibilities remain with the EDP.  However, [facility] available 24/7 for questions and assistance.   Nursing staff please page [facility] Admits and Consults [phone]) as soon as the patient arrives to the hospital.    Author: Tereasa Coop, MD  07/06/2023  [facility]

## 2023-07-05 NOTE — ED Provider Triage Note (Signed)
 Emergency Medicine Provider Triage Evaluation Note  Dylan Sweeney , a 53 y.o. male  was evaluated in triage.  Pt complains of shortness of breath, chest pain when coughing, cough. SOB began today. Cough for 2 days. Denies known sick contacts. Denies abdominal pain, N/V/D. Denies leg swelling. Reports chest pain when coughing. Reports last time this occurred he needed dialysis. States he goes T/Th/Sat. Last treatment was on Saturday, full treatment.  Review of Systems  Positive:  Negative:   Physical Exam  BP (!) 155/103   Pulse (!) 114   Temp 98.5 F (36.9 C)   Resp 20   SpO2 94%  Gen:   Awake, no distress   Resp:  Normal effort  MSK:   Moves extremities without difficulty  Other:  No edema bilateral lower extremities.  Medical Decision Making  Medically screening exam initiated at 7:32 PM.  Appropriate orders placed.  Dylan Sweeney was informed that the remainder of the evaluation will be completed by another provider, this initial triage assessment does not replace that evaluation, and the importance of remaining in the ED until their evaluation is complete.     Al Decant, PA-C 07/05/23 1934

## 2023-07-05 NOTE — ED Provider Notes (Addendum)
 [place] EMERGENCY DEPARTMENT AT [facility] Provider Note   CSN: [id number] Arrival date & time: 07/05/23  1324     History  Chief Complaint  Patient presents with   Shortness of Breath   Dizziness    Dylan Sweeney is a 53 y.o. male.  Patient is a 53 year old male with past medical history of end-stage renal disease on dialysis Tuesday, Thursday, Saturday, sickle cell disease, asthma, obstructive sleep apnea, and depression presenting for cough and shortness of breath.  Patient admits to fevers.  Denies nausea, vomiting, abdominal pain, diarrhea.  Patient has not missed any dialysis appointments-last dialysis Saturday.  The history is provided by the patient. No language interpreter was used.  Shortness of Breath Associated symptoms: cough   Associated symptoms: no abdominal pain, no chest pain, no ear pain, no fever, no rash, no sore throat and no vomiting   Dizziness Associated symptoms: shortness of breath   Associated symptoms: no chest pain, no palpitations and no vomiting        Home Medications Prior to Admission medications   Medication Sig Start Date End Date Taking? Authorizing Provider  albuterol (PROVENTIL HFA;VENTOLIN HFA) 108 (90 BASE) MCG/ACT inhaler Inhale 2 puffs into the lungs every 6 (six) hours as needed for wheezing or shortness of breath.     [provider]  amLODipine (NORVASC) 10 MG tablet Take 1 tablet (10 mg total) by mouth daily. 03/19/15   Richarda Overlie, MD  calcitRIOL (ROCALTROL) 0.25 MCG capsule Take 0.25 mcg by mouth daily.    [provider]  finasteride (PROSCAR) 5 MG tablet Take 5 mg by mouth daily.    [provider]  folic acid (FOLVITE) 1 MG tablet Take 1 tablet (1 mg total) by mouth daily. 03/19/15   Richarda Overlie, MD  folic acid-vitamin b complex-vitamin c-selenium-zinc (DIALYVITE) 3 MG TABS tablet Take 1 tablet by mouth daily.    [provider]  hydrALAZINE (APRESOLINE) 100 MG tablet  Take 1 tablet (100 mg total) by mouth every 8 (eight) hours. Patient not taking: Reported on 10/19/2021 03/19/15   Richarda Overlie, MD  HYDROcodone-acetaminophen (NORCO/VICODIN) 5-325 MG tablet Take 1 tablet by mouth every 6 (six) hours as needed for moderate pain or severe pain. 10/23/21   Narda Bonds, MD  HYDROcodone-acetaminophen (NORCO/VICODIN) 5-325 MG tablet Take 1 tablet by mouth every 4 (four) hours as needed. 11/01/21   Jacalyn Lefevre, MD  labetalol (NORMODYNE) 300 MG tablet Take 300 mg by mouth 3 (three) times daily.    [provider]  tamsulosin (FLOMAX) 0.4 MG CAPS capsule Take 2 capsules (0.8 mg total) by mouth daily after supper. 10/23/21   Narda Bonds, MD  vitamin B-12 (CYANOCOBALAMIN) 500 MCG tablet Take 500 mcg by mouth daily.    [provider]      Allergies    Lisinopril and Other    Review of Systems   Review of Systems  Constitutional:  Negative for chills and fever.  HENT:  Negative for ear pain and sore throat.   Eyes:  Negative for pain and visual disturbance.  Respiratory:  Positive for cough and shortness of breath.   Cardiovascular:  Negative for chest pain and palpitations.  Gastrointestinal:  Negative for abdominal pain and vomiting.  Genitourinary:  Negative for dysuria and hematuria.  Musculoskeletal:  Negative for arthralgias and back pain.  Skin:  Negative for color change and rash.  Neurological:  Positive for dizziness. Negative for seizures and syncope.  All  other systems reviewed and are negative.   Physical Exam Updated Vital Signs BP (!) 173/100   Pulse (!) 116   Temp (!) 100.9 F (38.3 C) (Oral)   Resp 18   SpO2 92%  Physical Exam Vitals and nursing note reviewed.  Constitutional:      General: He is not in acute distress.    Appearance: He is well-developed.  HENT:     Head: Normocephalic and atraumatic.  Eyes:     Conjunctiva/sclera: Conjunctivae normal.  Cardiovascular:     Rate and Rhythm: Regular rhythm.  Tachycardia present.     Heart sounds: No murmur heard. Pulmonary:     Effort: Pulmonary effort is normal. Tachypnea present. No respiratory distress.     Breath sounds: Normal breath sounds.  Abdominal:     Palpations: Abdomen is soft.     Tenderness: There is no abdominal tenderness.  Musculoskeletal:        General: No swelling.     Cervical back: Neck supple.  Skin:    General: Skin is warm and dry.     Capillary Refill: Capillary refill takes less than 2 seconds.  Neurological:     Mental Status: He is alert.  Psychiatric:        Mood and Affect: Mood normal.     ED Results / Procedures / Treatments   Labs (all labs ordered are listed, but only abnormal results are displayed) Labs Reviewed  RESP PANEL BY RT-PCR (RSV, FLU A&B, COVID)  RVPGX2 - Abnormal; Notable for the following components:      Result Value   Influenza A by PCR POSITIVE (*)    All other components within normal limits  BASIC METABOLIC PANEL - Abnormal; Notable for the following components:   Chloride 95 (*)    BUN 45 (*)    Creatinine, Ser 11.32 (*)    Calcium 8.5 (*)    GFR, Estimated 5 (*)    All other components within normal limits  D-DIMER, QUANTITATIVE - Abnormal; Notable for the following components:   D-Dimer, Quant 0.70 (*)    All other components within normal limits  CBC - Abnormal; Notable for the following components:   RBC 3.10 (*)    Hemoglobin 9.5 (*)    HCT 27.8 (*)    All other components within normal limits  TROPONIN I (HIGH SENSITIVITY)  TROPONIN I (HIGH SENSITIVITY)    EKG None  Radiology No results found.  Procedures .Critical Care  Performed by: Franne Forts, DO Authorized by: Franne Forts, DO   Critical care provider statement:    Critical care time (minutes):  75   Critical care was time spent personally by me on the following activities:  Development of treatment plan with patient or surrogate, discussions with consultants, evaluation of patient's  response to treatment, examination of patient, ordering and review of laboratory studies, ordering and review of radiographic studies, ordering and performing treatments and interventions, pulse oximetry, re-evaluation of patient's condition and review of old charts   Care discussed with: admitting provider       Medications Ordered in ED Medications  oseltamivir (TAMIFLU) capsule 75 mg (has no administration in time range)  acetaminophen (TYLENOL) tablet 650 mg (650 mg Oral Given 07/05/23 2244)  ondansetron (ZOFRAN-ODT) disintegrating tablet 4 mg (4 mg Oral Given 07/05/23 2244)  ipratropium-albuterol (DUONEB) 0.5-2.5 (3) MG/3ML nebulizer solution 3 mL (3 mLs Nebulization Given 07/05/23 2247)  ketorolac (TORADOL) 15 MG/ML injection 15 mg (15 mg Intravenous  Given 07/05/23 2245)  albuterol (PROVENTIL) (2.5 MG/3ML) 0.083% nebulizer solution 2.5 mg (2.5 mg Nebulization Given 07/05/23 2343)    ED Course/ Medical Decision Making/ A&P                                 Medical Decision Making Risk OTC drugs. Prescription drug management. Decision regarding hospitalization.   53 year old male with past medical history of end-stage renal disease on dialysis Tuesday, Thursday, Saturday, sickle cell disease, asthma, obstructive sleep apnea, and depression presenting for cough and shortness of breath.  Patient is alert oriented x 3, no acute distress, febrile at 100.9 F hypertensive at 170/113, tachycardic at 121 and tachypneic at 20 breaths/min.  He is equal bilateral breath sounds with no adventitious lung sounds.  No lower extremity peripheral edema.  Toradol and Tylenol given for symptomatic control of bodyaches as well as fever.  No leukocytosis.  Patient is influenza A positive.  X-ray demonstrates no acute process.  No focal opacities.  No pleural effusions.  Patient satting at 90% on room on room air.  Patient recommended for admission for acute respiratory failure with hypoxia secondary to influenza  A.  Patient currently tachycardic at 124 with a pulse ox of 93% and tachypnea of 23%.  He had improvement of the symptoms after DuoNeb treatment.  Albuterol repeat ordered. Tamiflu ordered.   Patient accepted by admitting team.       Final Clinical Impression(s) / ED Diagnoses Final diagnoses:  Acute respiratory failure with hypoxia (HCC)  Influenza A  Uncomplicated asthma, unspecified asthma severity, unspecified whether persistent    Rx / DC Orders ED Discharge Orders     None         Franne Forts, DO 07/05/23 2339    Edwin Dada P, DO 07/05/23 2359

## 2023-07-06 ENCOUNTER — Encounter (HOSPITAL_COMMUNITY): Payer: Self-pay

## 2023-07-06 DIAGNOSIS — J45901 Unspecified asthma with (acute) exacerbation: Secondary | ICD-10-CM | POA: Diagnosis present

## 2023-07-06 MED ORDER — OSELTAMIVIR PHOSPHATE 30 MG PO CAPS: 30.0000 mg | ORAL_CAPSULE | ORAL | Status: DC

## 2023-07-06 MED ORDER — IPRATROPIUM BROMIDE 0.02 % IN SOLN
0.5000 mg | Freq: Four times a day (QID) | RESPIRATORY_TRACT | Status: DC
  Administered 2023-07-06: 0.5 mg via RESPIRATORY_TRACT
  Filled 2023-07-06: qty 2.5

## 2023-07-06 MED ORDER — METHYLPREDNISOLONE SODIUM SUCC 125 MG IJ SOLR
125.0000 mg | Freq: Once | INTRAMUSCULAR | Status: AC
  Administered 2023-07-06: 125 mg via INTRAVENOUS
  Filled 2023-07-06: qty 2

## 2023-07-06 MED ORDER — LEVALBUTEROL HCL 0.63 MG/3ML IN NEBU
0.6300 mg | INHALATION_SOLUTION | Freq: Four times a day (QID) | RESPIRATORY_TRACT | Status: DC
  Administered 2023-07-06: 0.63 mg via RESPIRATORY_TRACT
  Filled 2023-07-06: qty 3

## 2023-07-06 NOTE — ED Provider Notes (Signed)
 Patient told nurse that he was leaving AGAINST MEDICAL ADVICE.  He normally dialyzes Tuesday Thursday Saturday with last dialysis on Thursday.  He states that he is going to the [place] to be dialyzed.  He is advised against this.  We have discussed with him that we are trying to get a bed for him at [facility].  He has been hypertensive with some borderline fever.  Review of oxygen saturations show at 98%.  However I do not know if this was done on oxygen or not.  He was being admitted due to his flu positive test and respiratory failure.  He is awake and alert.  We have discussed risks and advisement that he stay here in the ED.  Patient voices that he understands.  Significant other is at bedside.  I have had the discussion with them present The patient has decided to leave against medical advice.  The patient had a normal mental status examination and understands his/ her condition and the risks of leaving as death including respiratory failure including  permanent disability and death. The patient has had an opportunity to ask  questions about his medical condition. The patient has been informed  that he may return for care at any time and has been referred to hi physician. The patient's family member was present for the conversation.    Margarita Grizzle, MD 07/06/23 1146

## 2023-07-06 NOTE — ED Notes (Signed)
 Per pt he is aware ,and so was person in room with him, that his leaving AMA may result in dire consequences including his dying he states he is aware  IV d/c and pt ambulatory out of room with his visitor , pt states he is going to dialysis

## 2023-07-06 NOTE — ED Notes (Signed)
 Walk[place] room pt states he is leaving that he has been here all night  pt sitting on side of bed had pulled some leads off BP cuff off and pulse ox  explained that hopefully after bed meeting we would be getting beds

## 2023-07-06 NOTE — ED Notes (Signed)
 Patient with history of OSA. Per patient, does not utilize CPAP.

## 2023-07-06 NOTE — ED Notes (Signed)
 Pt again stating he wants to leave and that wife is here to get him , Dr ray  made aware , charge nurse aware and in to speak to pt

## 2023-07-06 NOTE — ED Notes (Signed)
 Requested by Arline Asp to speak with pt regarding him wanting to leave AMA. Pt sitting up on edge of bed, alert, on O2 via [place] speaking with family member on phone stating he is leaving. Pt states he understands risks of leaving AMA and will sign form. Pt states although he still feels SOB he is going to leave and go to his normal dialysis despite recommendations and risks.

## 2023-07-06 NOTE — ED Notes (Signed)
 2LNC applied, sats improved

## 2023-07-06 NOTE — ED Notes (Signed)
 Pt continues to pull off O2 while asleep. Reapplied and reiterated importance of keeping it on

## 2023-07-09 NOTE — Progress Notes (Signed)
 Case Management Discharge Note        CSN: [id number] DOB: March 09, 1971 Service: Nephrology Location: N801/A  Patient Class: Inpatient  DC Disposition: : Home or Self Care  Discharge DC Disposition: : Home or Self Care  Discharge Referrals Case closed, patient/family agree with disposition plan: Yes  Pt is discharging to home. He will resume outpatient HD at [facility] as scheduled. Pt does not require home oxygen therapy. No discharge needs identified.  Landry Ferries, MSW, LCSW Cell Phone: [phone]

## 2023-07-09 NOTE — Nursing Note (Signed)
 Pt for discharge. IV cannula remove. After visit summary given to the pt and discharge instruction given and verbalize understanding to all the information provided. Pt wen home in good condition via wheelchair, accompanied by transporter and relatives

## 2023-07-12 NOTE — Care Plan (Signed)
 Nephrology Plan of Care Note: Dialysis Book Back Orders  These dialysis bookback orders have been communicated directly with Arleen, RN at [facility] dialysis center on 07/12/23 at 0841.   Patient: Dylan Sweeney Date of Birth: Aug 27, 1970 MRN: [id number] Admission Date: 07/06/2023  5:09 PM Discharge Date: 07/09/2023  4:09 PM  Discharge Diagnosis:  ESRD on HD - venous pressure elevated on 3/6  Acute hypoxic respiratory failure 2/2 Influenza A   Most Recent Renal Labs  Results from last 7 days  Lab Units 07/09/23 0350 07/08/23 0540  HEMOGLOBIN g/dL 89.7* 9.4*  POTASSIUM mmol/L 4.1 4.4  CO2 mmol/L 29 27  CALCIUM  mg/dL 7.8* 8.1*  PHOSPHORUS mg/dL 5.8* 7.6*   Outpatient Dialysis Book Back Orders - Please change estimated dry weight to: 71 kg   Medication and Lab Orders: - See updated medication list below  Updated discharge medication list:   Medication List     START taking these medications    benzonatate 200 mg capsule Commonly known as: TESSALON Take 1 capsule (200 mg total) by mouth 3 (three) times a day as needed for cough.   oseltamivir  30 mg capsule Commonly known as: TAMIFLU  Take one capsule once per day on Saturday and Tuesday after dialysis.   predniSONE 20 mg tablet Commonly known as: DELTASONE Take 2 tablets (40 mg total) by mouth daily for 2 days.       CHANGE how you take these medications    calcium  acetate 667 mg (169 mg calcium ) capsule Commonly known as: PHOSLO  Take 3 capsules (2,001 mg total) by mouth in the morning and 3 capsules (2,001 mg total) at noon and 3 capsules (2,001 mg total) in the evening. Take with meals. What changed: how much to take   sildenafiL 20 mg tablet Commonly known as: REVATIO Take 2-5 tablets as needed to achieve erection. Do not use more than once per day. Strength: 20 mg What changed:  how much to take how to take this when to take this reasons to take this       CONTINUE taking these medications     albuterol  HFA 90 mcg/actuation inhaler Commonly known as: PROVENTIL  HFA;VENTOLIN  HFA;PROAIR  HFA Inhale 2 puffs 4 (four) times a day as needed for wheezing or shortness of breath.   atorvastatin 20 mg tablet Commonly known as: LIPITOR Take 1 tablet (20 mg total) by mouth at bedtime.   * cinacalcet 60 mg tablet Commonly known as: SENSIPAR TAKE ONE TABLET BY MOUTH TUESDAY, THURSDAY AND SATURDAY   * cinacalcet 30 mg tablet Commonly known as: SENSIPAR Take 1 tablet by mouth on Sunday, Monday, Wednesday and Friday   Dialyvite 1-100-300-50 mg-mg-mcg-mg Tab Generic drug: B complex 11-folic-C-biot-zinc Take 1 tablet by mouth Once Daily.   hydrALAZINE  100 mg tablet Commonly known as: APRESOLINE  Take 100 mg by mouth 3 (three) times a day.   labetaloL  300 mg tablet Commonly known as: NORMODYNE  Take 300 mg by mouth 3 (three) times a day.   lidocaine -prilocaine 2.5-2.5 % cream Commonly known as: EMLA Apply 1 Application topically as needed for mild pain (1-3).   losartan  100 mg tablet Commonly known as: COZAAR  Take 100 mg by mouth Once Daily.   mirtazapine 15 mg tablet Commonly known as: REMERON Take 7.5 mg by mouth at bedtime.   NIFEdipine 90 mg 24 hr tablet Commonly known as: PROCARDIA XL Take 1 tablet (90 mg total) by mouth daily.   sertraline  100 mg tablet Commonly known as: ZOLOFT  Take 150 mg by  mouth daily.   tamsulosin  0.4 mg Cap Commonly known as: FLOMAX  Take 0.8 mg by mouth at bedtime.      * * This list has 2 medication(s) that are the same as other medications prescribed for you. Read the directions carefully, and ask your doctor or other care provider to review them with you.          STOP taking these medications    calcitrioL  0.25 mcg capsule Commonly known as: ROCALTROL    carboxymethylcellulose 0.5 % Drop ophthalmic solution Commonly known as: REFRESH TEARS   sevelamer 800 mg tablet Commonly known as: RENAGEL/RENVELA         Where to Get  Your Medications     These medications were sent to [id number] - [place], [place] - [address] - PHONE: [phone] - FAX: [phone]  [address], [place] [address]    Phone: [phone]  benzonatate 200 mg capsule calcium  acetate 667 mg (169 mg calcium ) capsule oseltamivir  30 mg capsule predniSONE 20 mg tablet    During admission, the patient did not receive a RBC transfusion.  Allergy  list at DC: Allergies  Allergen Reactions  . Lisinopril Other (See Comments)    Couldn't stop coughing    Electronically signed by: Dena Trinidad Double, DNP 07/12/2023 8:40 AM  *Some images could not be shown.

## 2023-08-30 NOTE — Progress Notes (Signed)
 FIT TESTING Evaluation type:   New   GENERAL HEALTH:  In general, would you say your health is (circle one):  Excellent  Very Good   Good   Fair   Poor   Compared to one year ago, how would you rate your health in general now?  [] Much better now than one year ago  [x] Somewhat better now than one year ago  [] About the same  [] Somewhat worse now than one year ago  [] Much worse than one year ago      LIMITATIONS OF ACTIVITIES:    Vigorous activities, such as running, lifting heavy objects, participating in strenuous sports. [] Yes, Limited a lot  [x] Yes, Limited a Little  [] No, Not Limited at all  Moderate activities, such as moving a table, pushing a vacuum cleaner, bowling, or playing golf [] Yes, Limited a lot  [x] Yes, Limited a Little  [] No, Not Limited at all  Lifting or carrying groceries [] Yes, Limited a lot  [] Yes, Limited a Little  [x] No, Not Limited at all  Climbing several flights of stairs [] Yes, Limited a lot  [] Yes, Limited a Little  [x] No, Not Limited at all  Climbing one flight of stairs [] Yes, Limited a lot  [] Yes, Limited a Little  [x] No, Not Limited at all  Bending, kneeling, or stooping [] Yes, Limited a lot  [] Yes, Limited a Little  [x] No, Not Limited at all  Walking more than a mile [] Yes, Limited a lot  [] Yes, Limited a Little  [x] No, Not Limited at all  Walking several blocks [] Yes, Limited a lot  [] Yes, Limited a Little  [x] No, Not Limited at all  Walking one block [] Yes, Limited a lot  [] Yes, Limited a Little  [x] No, Not Limited at all  Bathing or dressing yourself [] Yes, Limited a lot  [] Yes, Limited a Little  [x] No, Not Limited at all   2 pts yes, limited a lot; 1 pt yes, limited a little; 0 pts No, not limited at all   SELF REPORT LIMITATIONS OF ACTIVITIES SCORE: 2   <8 FIT >/=8-14 intermediate  15-20 Not fit  Perform short physical performance testing in the following conditions (charted below if applicable):  Limitations score >/=  8 Age >/= 40 Assistive device present during exam Appearance of frailty despite above criteria   Do you use oxygen at home or at dialysis? Yes []  No[x]   Do you ever have trouble with blood pressure that is too low? Yes[]  No[x]   Do you own any of the following (check all that apply) [x] Cane  [] Walker [] Wheelchair [] Motorized scooter [] None of the above  How often do you use the motorized carts available to pick up your groceries? [x] Never [] Sometimes [] Every trip [] I am unable to shop alone  Have you lost more than 10 lbs without trying to in the last 12 months?  If so, how much? [x] Yes, 25_lbs   [] No/I was trying to lose weight  Do you need any assistance performing any of the following activities of daily living? [] Bathing yourself [] Dressing yourself [] Feeding yourself [] Going to the restroom [x] None of the above  Do you use a pillbox? Yes[x]  No[]   Who fills your pillbox? [] I fill it myself [x] Someone that lives in my house fills it [] Someone that does not live with me fills it [] No pillbox  Have you had any falls? [] Once in the last 6 months [] More than once in the last 6 months [] Once in the last 12 months [x] More than once in the last 12 months []  Never/None in  the last year     Fit Assessment  Physical Tool for Pre transplant candidate  Any assistive device used?  Short Physical Performance Test (assess chair stands, balance, gait speed, grip strength)   Score 13-0 (fit to not fit) 10-13 Fit 7-9 Intermediate <6 Frail  Chair Stands (goal less than 11 seconds): 8 sec Balance: 10 sec Grip: low for BMI 15 ft walk (goal less than 4 x2 attempts): 3.5 sec, 2.9 sec   Patient Score: 12       Summary:  Fit

## 2023-08-30 NOTE — Progress Notes (Signed)
 Pt attended Kidney  transplant education class with Wife today. Pre-coordinator role introduced, stressing importance of keeping contact information UTD. Pt and/or caregiver(s) indicated understanding of topics covered, including:required paperwork, transplant evaluation process/required testing, keeping health maintenance up to date, caregiver role/support network, lifelong commitment to a healthy lifestyle & medications after transplant, financial responsibilities, and potential living donors (teaching categories continued below). Exclusion criteria from becoming a candidate for transplant explained. Most recent SRTR results reviewed and provided.   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 Explain the role of the members of the transplant team. Benefits of living donors. Average waiting time on list. Options of multiple listing, transferring primary waiting time, and the option to transfer their care to a different transplant center without loss of accrued waiting time, during the evaluation process. Required Health Maintenance Patient responsibilities while on waiting list - notifying us  of change of address, phone numbers or insurance. Call-in procedure. Backup call. Length of stay/ICU/incision/catheter/central  line/possible drains. lmmunosuppressive drug therapy Common side effects of immunosuppressive therapy (weight gain, skin cancer) Prophylactic drug therapy/medication Potential post-transplant complications (infection, rejecti on, cancer). Encourage to always carry current medications to all doctor's appointments. Follow up clinic visits/after 9 months-1 year, back to referring physician. Frequent communication regarding care, labs, medication dose changes, etc. Patient responsibilities after transplant - taking medications as ordered, keeping clinic appointments, and notifying us  of potential complications. Business cards. Explain [place] Quitline for smoking cessation referral form.

## 2023-08-31 NOTE — Telephone Encounter (Signed)
 Called patient to f/u on his history of falls and alcohol uses. Patient has not had any recent falls and the ones he did have were attributed to his stroke from last yr. When asked about his alcohol use patient has not had any hard liquor in over 4 yrs.

## 2023-10-13 NOTE — Progress Notes (Signed)
 Transplant Clinical Pharmacist Practitioner Note  Dylan Sweeney was referred to transplant clinic for transplant evaluation.  He was accompanied to the visit by his wife.  We discussed the post-transplant medication use process and reviewed an example medication sheet for pre-transplant educational purposes.   Allergies  Allergen Reactions  . Lisinopril Other (See Comments)    Couldn't stop coughing     Current Outpatient Medications on File Prior to Visit  Medication Sig Dispense Refill  . albuterol  HFA (PROVENTIL  HFA;VENTOLIN  HFA;PROAIR  HFA) 90 mcg/actuation inhaler Inhale 2 puffs as needed in the morning and 2 puffs as needed at noon and 2 puffs as needed in the evening and 2 puffs as needed before bedtime for wheezing or shortness of breath.    SABRA atorvastatin (LIPITOR) 20 mg tablet Take 1 tablet (20 mg total) by mouth at bedtime. 90 tablet 3  . B complex 11-folic-C-biot-zinc (Dialyvite) 1-100-300-50 mg-mg-mcg-mg tab Take 1 tablet by mouth daily.    . cinacalcet (SENSIPAR) 30 mg tablet Take 1 tablet by mouth on Sunday, Monday, Wednesday and Friday    . cinacalcet (SENSIPAR) 60 mg tablet     . hydrALAZINE  (APRESOLINE ) 100 mg tablet Take 100 mg by mouth 3 (three) times a day.    . labetaloL  (NORMODYNE ) 300 mg tablet Take 300 mg by mouth 3 (three) times a day.    . lidocaine -prilocaine (EMLA) 2.5-2.5 % cream Apply 1 Application topically as needed for mild pain (1-3).    . losartan  (COZAAR ) 100 mg tablet Take 100 mg by mouth daily.    . mirtazapine (REMERON) 15 mg tablet Take 7.5 mg by mouth at bedtime.    SABRA NIFEdipine (PROCARDIA XL) 90 mg 24 hr tablet Take 1 tablet (90 mg total) by mouth daily. 90 tablet 0  . sertraline  (ZOLOFT ) 100 mg tablet Take 150 mg by mouth daily.    . tamsulosin  (FLOMAX ) 0.4 mg cap Take 0.8 mg by mouth at bedtime.    . calcium  acetate (PHOSLO ) 667 mg (169 mg calcium ) capsule Take 3 capsules (2,001 mg total) by mouth in the morning and 3 capsules (2,001 mg  total) at noon and 3 capsules (2,001 mg total) in the evening. Take with meals. 270 capsule 0  . sildenafiL (REVATIO) 20 mg tablet Take 2-5 tablets as needed to achieve erection. Do not use more than once per day. Strength: 20 mg (Patient taking differently: Take 2-5 tablets by mouth as needed (ED). Take 2-5 tablets as needed to achieve erection. Do not use more than once per day. Strength: 20 mg) 20 tablet 2  . [DISCONTINUED] oseltamivir  (TAMIFLU ) 30 mg capsule Take one capsule once per day on Saturday and Tuesday after dialysis. (Patient not taking: Reported on 10/13/2023) 2 capsule 0   No current facility-administered medications on file prior to visit.    The medication list has been reviewed and updated.   Notes for Selection Committee:    There are no medications listed that are problematic for transplant.

## 2023-10-13 NOTE — Telephone Encounter (Signed)
 Pt w/gluc 49.See provider notification.  Pt was called and he and his wife were just finishing lunch.  So his wife indicates we've just finished eating lunch and he is full and fine.  She appreciated us  calling and checking on them

## 2023-11-23 ENCOUNTER — Encounter (HOSPITAL_COMMUNITY): Payer: Self-pay | Admitting: Emergency Medicine

## 2023-11-23 ENCOUNTER — Emergency Department (HOSPITAL_COMMUNITY)

## 2023-11-23 ENCOUNTER — Emergency Department (HOSPITAL_COMMUNITY)
Admission: EM | Admit: 2023-11-23 | Discharge: 2023-11-23 | Disposition: A | Discharge status: Home / Self Care | Attending: Emergency Medicine | Admitting: Emergency Medicine

## 2023-11-23 ENCOUNTER — Other Ambulatory Visit: Payer: Self-pay

## 2023-11-23 DIAGNOSIS — M25141 Fistula, right hand: Secondary | ICD-10-CM | POA: Insufficient documentation

## 2023-11-23 DIAGNOSIS — Z87891 Personal history of nicotine dependence: Secondary | ICD-10-CM | POA: Diagnosis not present

## 2023-11-23 DIAGNOSIS — N186 End stage renal disease: Secondary | ICD-10-CM | POA: Insufficient documentation

## 2023-11-23 DIAGNOSIS — Z7951 Long term (current) use of inhaled steroids: Secondary | ICD-10-CM | POA: Diagnosis not present

## 2023-11-23 DIAGNOSIS — R0602 Shortness of breath: Secondary | ICD-10-CM | POA: Diagnosis present

## 2023-11-23 DIAGNOSIS — Z79899 Other long term (current) drug therapy: Secondary | ICD-10-CM | POA: Diagnosis not present

## 2023-11-23 DIAGNOSIS — Z992 Dependence on renal dialysis: Secondary | ICD-10-CM | POA: Diagnosis not present

## 2023-11-23 DIAGNOSIS — I120 Hypertensive chronic kidney disease with stage 5 chronic kidney disease or end stage renal disease: Secondary | ICD-10-CM | POA: Diagnosis not present

## 2023-11-23 DIAGNOSIS — J45909 Unspecified asthma, uncomplicated: Secondary | ICD-10-CM | POA: Insufficient documentation

## 2023-11-23 LAB — CBC
HCT: 29.8 % — ABNORMAL LOW (ref 39.0–52.0)
Hemoglobin: 9.7 g/dL — ABNORMAL LOW (ref 13.0–17.0)
MCH: 30 pg (ref 26.0–34.0)
MCHC: 32.6 g/dL (ref 30.0–36.0)
MCV: 92.3 fL (ref 80.0–100.0)
Platelets: 353 K/uL (ref 150–400)
RBC: 3.23 MIL/uL — ABNORMAL LOW (ref 4.22–5.81)
RDW: 13.8 % (ref 11.5–15.5)
WBC: 10.3 K/uL (ref 4.0–10.5)
nRBC: 0 % (ref 0.0–0.2)

## 2023-11-23 LAB — BASIC METABOLIC PANEL WITH GFR
Anion gap: 16 — ABNORMAL HIGH (ref 5–15)
BUN: 23 mg/dL — ABNORMAL HIGH (ref 6–20)
CO2: 26 mmol/L (ref 22–32)
Calcium: 8.9 mg/dL (ref 8.9–10.3)
Chloride: 92 mmol/L — ABNORMAL LOW (ref 98–111)
Creatinine, Ser: 7.34 mg/dL — ABNORMAL HIGH (ref 0.61–1.24)
GFR, Estimated: 8 mL/min — ABNORMAL LOW (ref >60)
Glucose, Bld: 84 mg/dL (ref 70–99)
Potassium: 4.5 mmol/L (ref 3.5–5.1)
Sodium: 134 mmol/L — ABNORMAL LOW (ref 135–145)

## 2023-11-23 MED ORDER — LORAZEPAM 1 MG PO TABS
1.0000 mg | ORAL_TABLET | Freq: Once | ORAL | Status: AC
  Administered 2023-11-23: 1 mg via ORAL
  Filled 2023-11-23: qty 1

## 2023-11-23 NOTE — ED Provider Notes (Signed)
 [place] EMERGENCY DEPARTMENT AT [facility] Provider Note  CSN: [id number] Arrival date & time: 11/23/23 1756  Chief Complaint(s) Shortness of Breath  HPI Dylan Sweeney is a 53 y.o. male history of end-stage renal disease on dialysis, hypertension, asthma presenting to the emergency department shortness of breath.  Patient reports shortness of breath today.  Did get better after dialysis.  Reports that they remove 3 L but he is still about 2 above his dry weight.  He is going to try to go back tomorrow to get additional dialysis.  He reports also feeling anxious about his shortness of breath and he is not sure if the shortness of breath is causing the anxiety or the anxiety is causing the shortness of breath.  He denies any chest pain, leg swelling.  Denies any orthopnea.  Denies any cough, runny nose, sore throat, fevers or chills.  Denies any chest pain, back pain, abdominal pain.  Denies any other new symptoms.   Past Medical History Past Medical History:  Diagnosis Date   Asthma    Depression    PTSD   Enlarged heart    History of kidney stones    in kidneys - no problems per patient 02/02/20   Hypertension    Peripheral vascular disease (HCC)    Renal disorder    stage 4   Renal insufficiency    Sickle cell anemia (HCC)    Has the trait-per patient (03/12/15)   Sickle cell trait (HCC)    Sleep apnea    Per pt on 02/02/20 - uses 2-3 x week as needed when patient can not sleep   Patient Active Problem List   Diagnosis Date Noted   Asthma exacerbation 07/06/2023   CKD (chronic kidney disease), stage V (HCC) 10/22/2021   Acute urinary retention 10/22/2021   Acute blood loss anemia 10/22/2021   UTI (urinary tract infection) 10/22/2021   Persistent gross hematuria 10/18/2021   PTSD (post-traumatic stress disorder) 10/18/2021   Acute kidney injury superimposed on chronic kidney disease (HCC) 01/09/2020   Hypertensive emergency 09/21/2018   AKI (acute kidney  injury) (HCC) 09/21/2018   CKD (chronic kidney disease) stage 3, GFR 30-59 ml/min (HCC) 09/21/2018   Vomiting 09/21/2018   Hypokalemia 09/21/2018   Hypertensive urgency 03/12/2015   Hypertensive urgency, malignant 03/12/2015   Marijuana use 03/12/2015   Back pain    Renal artery stenosis (HCC)    Home Medication(s) Prior to Admission medications   Medication Sig Start Date End Date Taking? Authorizing Provider  albuterol  (PROVENTIL  HFA;VENTOLIN  HFA) 108 (90 BASE) MCG/ACT inhaler Inhale 2 puffs into the lungs every 6 (six) hours as needed for wheezing or shortness of breath.     [provider]  amLODipine  (NORVASC ) 10 MG tablet Take 1 tablet (10 mg total) by mouth daily. 03/19/15   Abrol, Nayana, MD  calcitRIOL  (ROCALTROL ) 0.25 MCG capsule Take 0.25 mcg by mouth daily.    [provider]  finasteride (PROSCAR) 5 MG tablet Take 5 mg by mouth daily.    [provider]  folic acid  (FOLVITE ) 1 MG tablet Take 1 tablet (1 mg total) by mouth daily. 03/19/15   Abrol, Nayana, MD  folic acid -vitamin b complex-vitamin c-selenium-zinc (DIALYVITE) 3 MG TABS tablet Take 1 tablet by mouth daily.    [provider]  hydrALAZINE  (APRESOLINE ) 100 MG tablet Take 1 tablet (100 mg total) by mouth every 8 (eight) hours. Patient not taking: Reported on 10/19/2021 03/19/15   Abrol, Nayana,  MD  HYDROcodone -acetaminophen  (NORCO/VICODIN) 5-325 MG tablet Take 1 tablet by mouth every 6 (six) hours as needed for moderate pain or severe pain. 10/23/21   Briana Elgin LABOR, MD  HYDROcodone -acetaminophen  (NORCO/VICODIN) 5-325 MG tablet Take 1 tablet by mouth every 4 (four) hours as needed. 11/01/21   Haviland, Julie, MD  labetalol  (NORMODYNE ) 300 MG tablet Take 300 mg by mouth 3 (three) times daily.    [provider]  tamsulosin  (FLOMAX ) 0.4 MG CAPS capsule Take 2 capsules (0.8 mg total) by mouth daily after supper. 10/23/21   Briana Elgin LABOR, MD  vitamin B-12 (CYANOCOBALAMIN) 500 MCG  tablet Take 500 mcg by mouth daily.    [provider]                                                                                                                                    Past Surgical History Past Surgical History:  Procedure Laterality Date   AV FISTULA PLACEMENT Left 02/05/2020   Procedure: ARTERIOVENOUS (AV) FISTULA CREATION LEFT;  Surgeon: Gretta Lonni PARAS, MD;  Location: [facility] OR;  Service: Vascular;  Laterality: Left;   NO PAST SURGERIES     Family History Family History  Problem Relation Age of Onset   Heart disease Mother    Hypertension Sister    Asthma Sister    Hypertension Brother     Social History Social History   Tobacco Use   Smoking status: Former    Types: Cigarettes   Smokeless tobacco: Never   Tobacco comments:    one pack would last a month-08/27/17  Vaping Use   Vaping status: Never Used  Substance Use Topics   Alcohol use: Yes    Alcohol/week: 6.0 standard drinks of alcohol    Types: 6 Cans of beer per week    Comment: 6 pack/week   Drug use: Yes    Types: Marijuana    Comment: Last use marijuana 01/27/20   Allergies Lisinopril and Other  Review of Systems Review of Systems  All other systems reviewed and are negative.   Physical Exam Vital Signs  I have reviewed the triage vital signs BP 121/83   Pulse 93   Temp 98.8 F (37.1 C) (Oral)   Resp 18   Ht 5' 11 (1.803 m)   Wt 77.1 kg   SpO2 100%   BMI 23.71 kg/m  Physical Exam Vitals and nursing note reviewed.  Constitutional:      General: He is not in acute distress.    Appearance: Normal appearance.  HENT:     Mouth/Throat:     Mouth: Mucous membranes are moist.  Eyes:     Conjunctiva/sclera: Conjunctivae normal.  Neck:     Vascular: No hepatojugular reflux or JVD.  Cardiovascular:     Rate and Rhythm: Normal rate and regular rhythm.  Pulmonary:     Effort: Pulmonary effort is normal. No respiratory distress.  Breath sounds: Normal breath  sounds.  Abdominal:     General: Abdomen is flat.     Palpations: Abdomen is soft.     Tenderness: There is no abdominal tenderness.  Musculoskeletal:     Right lower leg: No edema.     Left lower leg: No edema.     Comments: Right upper extremity fistula with thrill  Skin:    General: Skin is warm and dry.     Capillary Refill: Capillary refill takes less than 2 seconds.  Neurological:     Mental Status: He is alert and oriented to person, place, and time. Mental status is at baseline.  Psychiatric:        Mood and Affect: Mood normal.        Behavior: Behavior normal.     ED Results and Treatments Labs (all labs ordered are listed, but only abnormal results are displayed) Labs Reviewed  BASIC METABOLIC PANEL WITH GFR - Abnormal; Notable for the following components:      Result Value   Sodium 134 (*)    Chloride 92 (*)    BUN 23 (*)    Creatinine, Ser 7.34 (*)    GFR, Estimated 8 (*)    Anion gap 16 (*)    All other components within normal limits  CBC - Abnormal; Notable for the following components:   RBC 3.23 (*)    Hemoglobin 9.7 (*)    HCT 29.8 (*)    All other components within normal limits                                                                                                                          Radiology DG Chest 2 View Result Date: 11/23/2023 CLINICAL DATA:  Shortness of breath EXAM: CHEST - 2 VIEW COMPARISON:  07/05/2023, 10/08/2022, 09/11/2016 FINDINGS: The heart size and mediastinal contours are within normal limits. Both lungs are clear. The visualized skeletal structures are unremarkable. IMPRESSION: No active cardiopulmonary disease. Electronically Signed   By: Luke Bun M.D.   On: 11/23/2023 18:50    Pertinent labs & imaging results that were available during my care of the patient were reviewed by me and considered in my medical decision making (see MDM for details).  Medications Ordered in ED Medications  LORazepam  (ATIVAN ) tablet  1 mg (has no administration in time range)  Procedures Procedures  (including critical care time)  Medical Decision Making / ED Course   MDM:  53 year old presenting to the emergency department shortness of breath.  Patient is overall well-appearing, physical examination without focal finding.  Unclear cause of shortness of breath.  Chest x-ray clear without signs of pulmonary edema, pneumonia, pneumothorax.  Labs reassuring with chronic unchanged anemia.  Clinically, patient is without signs of volume overload such as lower extremity edema or JVD.  Patient does report feeling very anxious, this could be potentially cause, he does report chronic anxiety and sometimes feels anxious after dialysis.  Considered other process such as pulmonary embolism, but no chest pain, no hypoxia, no tachycardia, no leg swelling, recent travel or surgery.  Considered ACS, no chest pain or other concerning symptoms and EKG reassuring overall, given reassuring exam, reassuring workup, feel patient is stable for discharge, close follow-up and strict return precautions.  Patient feels comfortable with this.  He feels less anxious and short of breath after I discussed his reassuring workup.  Will discharge patient to home. All questions answered. Patient comfortable with plan of discharge. Return precautions discussed with patient and specified on the after visit summary.       Additional history obtained:  -External records from outside source obtained and reviewed including: Chart review including previous notes, labs, imaging, consultation notes including prior notes    Lab Tests: -I ordered, reviewed, and interpreted labs.   The pertinent results include:   Labs Reviewed  BASIC METABOLIC PANEL WITH GFR - Abnormal; Notable for the following components:      Result Value    Sodium 134 (*)    Chloride 92 (*)    BUN 23 (*)    Creatinine, Ser 7.34 (*)    GFR, Estimated 8 (*)    Anion gap 16 (*)    All other components within normal limits  CBC - Abnormal; Notable for the following components:   RBC 3.23 (*)    Hemoglobin 9.7 (*)    HCT 29.8 (*)    All other components within normal limits    Notable for chronic anemia   EKG   EKG Interpretation Date/Time:  Tuesday November 23 2023 18:33:39 EDT Ventricular Rate:  98 PR Interval:  177 QRS Duration:  92 QT Interval:  377 QTC Calculation: 482 R Axis:   71  Text Interpretation: Sinus rhythm Consider left atrial enlargement Left ventricular hypertrophy Anterior Q waves, possibly due to LVH No significant change since last tracing Confirmed by Francesca Fallow ([id number]) on 11/23/2023 8:00:09 PM         Imaging Studies ordered: I ordered imaging studies including CXR On my interpretation imaging demonstrates no acute process I independently visualized and interpreted imaging. I agree with the radiologist interpretation   Medicines ordered and prescription drug management: Meds ordered this encounter  Medications   LORazepam  (ATIVAN ) tablet 1 mg    -I have reviewed the patients home medicines and have made adjustments as needed   Reevaluation: After the interventions noted above, I reevaluated the patient and found that their symptoms have improved  Co morbidities that complicate the patient evaluation  Past Medical History:  Diagnosis Date   Asthma    Depression    PTSD   Enlarged heart    History of kidney stones    in kidneys - no problems per patient 02/02/20   Hypertension    Peripheral vascular disease (HCC)    Renal disorder    stage 4  Renal insufficiency    Sickle cell anemia (HCC)    Has the trait-per patient (03/12/15)   Sickle cell trait (HCC)    Sleep apnea    Per pt on 02/02/20 - uses 2-3 x week as needed when patient can not sleep      Dispostion: Disposition decision  including need for hospitalization was considered, and patient discharged from emergency department.    Final Clinical Impression(s) / ED Diagnoses Final diagnoses:  Shortness of breath     This chart was dictated using voice recognition software.  Despite best efforts to proofread,  errors can occur which can change the documentation meaning.    Francesca Elsie CROME, MD 11/23/23 2006

## 2023-11-23 NOTE — Discharge Instructions (Addendum)
 We evaluated you for your shortness of breath.  Your testing in the emergency department is reassuring, your chest x-ray did not show any retained fluid and your laboratory testing was also reassuring.  We feel that is safe to go home.  Please follow-up closely with your primary doctor.  If you develop any new symptoms like chest pain, worsening shortness of breath, lightheadedness or dizziness, fainting, cough, fevers or chills, leg swelling, or any other new symptoms, please return to the emergency department for reassessment.

## 2023-11-23 NOTE — ED Triage Notes (Signed)
 Patient c/o anxiety and SOB after his dialysis treatment today. Patient report SOB progressively getting worse tonight. Patient denies chest pain. Patient denies N/V. Patient denies fever.

## 2023-12-25 ENCOUNTER — Emergency Department (HOSPITAL_COMMUNITY)

## 2023-12-25 ENCOUNTER — Emergency Department (HOSPITAL_COMMUNITY)
Admission: EM | Admit: 2023-12-25 | Discharge: 2023-12-26 | Disposition: A | Discharge status: Home / Self Care | Attending: Emergency Medicine | Admitting: Emergency Medicine

## 2023-12-25 ENCOUNTER — Other Ambulatory Visit: Payer: Self-pay

## 2023-12-25 ENCOUNTER — Encounter (HOSPITAL_COMMUNITY): Payer: Self-pay

## 2023-12-25 DIAGNOSIS — Z992 Dependence on renal dialysis: Secondary | ICD-10-CM | POA: Diagnosis not present

## 2023-12-25 DIAGNOSIS — Z79899 Other long term (current) drug therapy: Secondary | ICD-10-CM | POA: Diagnosis not present

## 2023-12-25 DIAGNOSIS — R109 Unspecified abdominal pain: Secondary | ICD-10-CM | POA: Insufficient documentation

## 2023-12-25 DIAGNOSIS — R0789 Other chest pain: Secondary | ICD-10-CM | POA: Diagnosis present

## 2023-12-25 DIAGNOSIS — N186 End stage renal disease: Secondary | ICD-10-CM | POA: Diagnosis not present

## 2023-12-25 DIAGNOSIS — E871 Hypo-osmolality and hyponatremia: Secondary | ICD-10-CM | POA: Diagnosis not present

## 2023-12-25 DIAGNOSIS — R739 Hyperglycemia, unspecified: Secondary | ICD-10-CM | POA: Diagnosis not present

## 2023-12-25 DIAGNOSIS — D72829 Elevated white blood cell count, unspecified: Secondary | ICD-10-CM | POA: Diagnosis not present

## 2023-12-25 DIAGNOSIS — J45909 Unspecified asthma, uncomplicated: Secondary | ICD-10-CM | POA: Diagnosis not present

## 2023-12-25 DIAGNOSIS — I120 Hypertensive chronic kidney disease with stage 5 chronic kidney disease or end stage renal disease: Secondary | ICD-10-CM | POA: Insufficient documentation

## 2023-12-25 LAB — CBC WITH DIFFERENTIAL/PLATELET
Abs Immature Granulocytes: 0.04 K/uL (ref 0.00–0.07)
Basophils Absolute: 0.1 K/uL (ref 0.0–0.1)
Basophils Relative: 1 %
Eosinophils Absolute: 0.6 K/uL — ABNORMAL HIGH (ref 0.0–0.5)
Eosinophils Relative: 4 %
HCT: 42.4 % (ref 39.0–52.0)
Hemoglobin: 14.1 g/dL (ref 13.0–17.0)
Immature Granulocytes: 0 %
Lymphocytes Relative: 36 %
Lymphs Abs: 5.2 K/uL — ABNORMAL HIGH (ref 0.7–4.0)
MCH: 31.3 pg (ref 26.0–34.0)
MCHC: 33.3 g/dL (ref 30.0–36.0)
MCV: 94.2 fL (ref 80.0–100.0)
Monocytes Absolute: 1.2 K/uL — ABNORMAL HIGH (ref 0.1–1.0)
Monocytes Relative: 8 %
Neutro Abs: 7.4 K/uL (ref 1.7–7.7)
Neutrophils Relative %: 51 %
Platelets: 403 K/uL — ABNORMAL HIGH (ref 150–400)
RBC: 4.5 MIL/uL (ref 4.22–5.81)
RDW: 14.4 % (ref 11.5–15.5)
WBC: 14.5 K/uL — ABNORMAL HIGH (ref 4.0–10.5)
nRBC: 0 % (ref 0.0–0.2)

## 2023-12-25 LAB — LIPASE, BLOOD: Lipase: 52 U/L — ABNORMAL HIGH (ref 11–51)

## 2023-12-25 LAB — TROPONIN I (HIGH SENSITIVITY): Troponin I (High Sensitivity): 16 ng/L (ref <18)

## 2023-12-25 MED ORDER — LIDOCAINE VISCOUS HCL 2 % MT SOLN
15.0000 mL | Freq: Once | OROMUCOSAL | Status: AC
  Administered 2023-12-26: 15 mL via ORAL
  Filled 2023-12-25: qty 15

## 2023-12-25 MED ORDER — ALUM & MAG HYDROXIDE-SIMETH 200-200-20 MG/5ML PO SUSP
30.0000 mL | Freq: Once | ORAL | Status: AC
  Administered 2023-12-26: 30 mL via ORAL
  Filled 2023-12-25: qty 30

## 2023-12-25 MED ORDER — ONDANSETRON HCL 4 MG/2ML IJ SOLN: 4.0000 mg | Freq: Once | INTRAMUSCULAR | Status: DC | PRN

## 2023-12-25 NOTE — ED Triage Notes (Signed)
 Pt coming in with complaints of epigastric/central burning abdominal pain coming on gradually about an hour ago with associated N/V. 2 episodes vomiting reported. Emesis was dark per patient. Pt received HD earlier today and reports not feeling well since leaving. Denies cardiac history. Denies fever, diarrhea. Associated dizziness reported.

## 2023-12-25 NOTE — ED Notes (Signed)
 Pt unable to give urine sample at the moment. Pt has been given a urinal for when he is able to go. Pt informed to use call button so sample can be retrieved.

## 2023-12-25 NOTE — ED Provider Notes (Signed)
 [place] EMERGENCY DEPARTMENT AT [facility] Provider Note  CSN: [id number] Arrival date & time: 12/25/23 2241  Chief Complaint(s) Chest Pain  HPI Dylan Sweeney is a 53 y.o. male {Add pertinent medical, surgical, social history, OB history to HPI:1}    Chest Pain   Past Medical History Past Medical History:  Diagnosis Date   Asthma    Depression    PTSD   Enlarged heart    History of kidney stones    in kidneys - no problems per patient 02/02/20   Hypertension    Peripheral vascular disease (HCC)    Renal disorder    stage 4   Renal insufficiency    Sickle cell anemia (HCC)    Has the trait-per patient (03/12/15)   Sickle cell trait (HCC)    Sleep apnea    Per pt on 02/02/20 - uses 2-3 x week as needed when patient can not sleep   Patient Active Problem List   Diagnosis Date Noted   Asthma exacerbation 07/06/2023   CKD (chronic kidney disease), stage V (HCC) 10/22/2021   Acute urinary retention 10/22/2021   Acute blood loss anemia 10/22/2021   UTI (urinary tract infection) 10/22/2021   Persistent gross hematuria 10/18/2021   PTSD (post-traumatic stress disorder) 10/18/2021   Acute kidney injury superimposed on chronic kidney disease (HCC) 01/09/2020   Hypertensive emergency 09/21/2018   AKI (acute kidney injury) (HCC) 09/21/2018   CKD (chronic kidney disease) stage 3, GFR 30-59 ml/min (HCC) 09/21/2018   Vomiting 09/21/2018   Hypokalemia 09/21/2018   Hypertensive urgency 03/12/2015   Hypertensive urgency, malignant 03/12/2015   Marijuana use 03/12/2015   Back pain    Renal artery stenosis (HCC)    Home Medication(s) Prior to Admission medications   Medication Sig Start Date End Date Taking? Authorizing Provider  albuterol  (PROVENTIL  HFA;VENTOLIN  HFA) 108 (90 BASE) MCG/ACT inhaler Inhale 2 puffs into the lungs every 6 (six) hours as needed for wheezing or shortness of breath.     [provider]  amLODipine  (NORVASC ) 10 MG tablet Take 1  tablet (10 mg total) by mouth daily. 03/19/15   Abrol, Nayana, MD  calcitRIOL  (ROCALTROL ) 0.25 MCG capsule Take 0.25 mcg by mouth daily.    [provider]  finasteride (PROSCAR) 5 MG tablet Take 5 mg by mouth daily.    [provider]  folic acid  (FOLVITE ) 1 MG tablet Take 1 tablet (1 mg total) by mouth daily. 03/19/15   Abrol, Nayana, MD  folic acid -vitamin b complex-vitamin c-selenium-zinc (DIALYVITE) 3 MG TABS tablet Take 1 tablet by mouth daily.    [provider]  hydrALAZINE  (APRESOLINE ) 100 MG tablet Take 1 tablet (100 mg total) by mouth every 8 (eight) hours. Patient not taking: Reported on 10/19/2021 03/19/15   Abrol, Nayana, MD  HYDROcodone -acetaminophen  (NORCO/VICODIN) 5-325 MG tablet Take 1 tablet by mouth every 6 (six) hours as needed for moderate pain or severe pain. 10/23/21   Briana Elgin LABOR, MD  HYDROcodone -acetaminophen  (NORCO/VICODIN) 5-325 MG tablet Take 1 tablet by mouth every 4 (four) hours as needed. 11/01/21   Haviland, Julie, MD  labetalol  (NORMODYNE ) 300 MG tablet Take 300 mg by mouth 3 (three) times daily.    [provider]  tamsulosin  (FLOMAX ) 0.4 MG CAPS capsule Take 2 capsules (0.8 mg total) by mouth daily after supper. 10/23/21   Briana Elgin LABOR, MD  vitamin B-12 (CYANOCOBALAMIN) 500 MCG tablet Take 500 mcg by mouth daily.    [provider]  Allergies Lisinopril and Other  Review of Systems Review of Systems  Cardiovascular:  Positive for chest pain.   As noted in HPI  Physical Exam Vital Signs  I have reviewed the triage vital signs BP (!) 144/103 (BP Location: Right Arm)   Pulse (!) 117   Temp 98.2 F (36.8 C) (Oral)   Resp 18   Ht 5' 11 (1.803 m)   Wt 72.6 kg   SpO2 100%   BMI 22.32 kg/m  *** Physical Exam  ED Results and Treatments Labs (all labs ordered are listed, but  only abnormal results are displayed) Labs Reviewed  LIPASE, BLOOD - Abnormal; Notable for the following components:      Result Value   Lipase 52 (*)    All other components within normal limits  CBC WITH DIFFERENTIAL/PLATELET - Abnormal; Notable for the following components:   WBC 14.5 (*)    Platelets 403 (*)    Lymphs Abs 5.2 (*)    Monocytes Absolute 1.2 (*)    Eosinophils Absolute 0.6 (*)    All other components within normal limits  COMPREHENSIVE METABOLIC PANEL WITH GFR  URINALYSIS, ROUTINE W REFLEX MICROSCOPIC  TROPONIN I (HIGH SENSITIVITY)                                                                                                                         EKG  EKG Interpretation Date/Time:    Ventricular Rate:    PR Interval:    QRS Duration:    QT Interval:    QTC Calculation:   R Axis:      Text Interpretation:         Radiology DG Chest 2 View Result Date: 12/25/2023 CLINICAL DATA:  Epigastric pain and vomiting. EXAM: CHEST - 2 VIEW COMPARISON:  November 23, 2023 FINDINGS: The heart size and mediastinal contours are within normal limits. Both lungs are clear. The visualized skeletal structures are unremarkable. IMPRESSION: No active cardiopulmonary disease. Electronically Signed   By: Suzen Dials M.D.   On: 12/25/2023 23:28    Medications Ordered in ED Medications  ondansetron  (ZOFRAN ) injection 4 mg (has no administration in time range)   Procedures Procedures  (including critical care time) Medical Decision Making / ED Course   Medical Decision Making Amount and/or Complexity of Data Reviewed Labs: ordered. Radiology: ordered.  Risk Prescription drug management.    ***    Final Clinical Impression(s) / ED Diagnoses Final diagnoses:  None    This chart was dictated using voice recognition software.  Despite best efforts to proofread,  errors can occur which can change the documentation meaning.

## 2023-12-26 LAB — COMPREHENSIVE METABOLIC PANEL WITH GFR
ALT: 16 U/L (ref 0–44)
AST: 45 U/L — ABNORMAL HIGH (ref 15–41)
Albumin: 4.1 g/dL (ref 3.5–5.0)
Alkaline Phosphatase: 68 U/L (ref 38–126)
Anion gap: 20 — ABNORMAL HIGH (ref 5–15)
BUN: 26 mg/dL — ABNORMAL HIGH (ref 6–20)
CO2: 25 mmol/L (ref 22–32)
Calcium: 9.8 mg/dL (ref 8.9–10.3)
Chloride: 86 mmol/L — ABNORMAL LOW (ref 98–111)
Creatinine, Ser: 7.46 mg/dL — ABNORMAL HIGH (ref 0.61–1.24)
GFR, Estimated: 8 mL/min — ABNORMAL LOW (ref >60)
Glucose, Bld: 187 mg/dL — ABNORMAL HIGH (ref 70–99)
Potassium: 4.1 mmol/L (ref 3.5–5.1)
Sodium: 131 mmol/L — ABNORMAL LOW (ref 135–145)
Total Bilirubin: 1 mg/dL (ref 0.0–1.2)
Total Protein: 8.7 g/dL — ABNORMAL HIGH (ref 6.5–8.1)

## 2023-12-26 LAB — TROPONIN I (HIGH SENSITIVITY): Troponin I (High Sensitivity): 19 ng/L — ABNORMAL HIGH (ref <18)

## 2023-12-26 MED ORDER — HYDROXYZINE HCL 25 MG PO TABS
25.0000 mg | ORAL_TABLET | Freq: Once | ORAL | Status: AC
  Administered 2023-12-26: 25 mg via ORAL
  Filled 2023-12-26: qty 1

## 2023-12-31 ENCOUNTER — Encounter (HOSPITAL_COMMUNITY): Payer: Self-pay

## 2024-03-09 ENCOUNTER — Encounter (HOSPITAL_COMMUNITY): Payer: Self-pay

## 2024-03-09 ENCOUNTER — Emergency Department (HOSPITAL_COMMUNITY)

## 2024-03-09 ENCOUNTER — Observation Stay (HOSPITAL_COMMUNITY)
Admission: EM | Admit: 2024-03-09 | Discharge: 2024-03-10 | Disposition: A | Discharge status: Home / Self Care | Attending: Internal Medicine | Admitting: Internal Medicine

## 2024-03-09 DIAGNOSIS — F1290 Cannabis use, unspecified, uncomplicated: Secondary | ICD-10-CM | POA: Diagnosis not present

## 2024-03-09 DIAGNOSIS — D509 Iron deficiency anemia, unspecified: Principal | ICD-10-CM

## 2024-03-09 DIAGNOSIS — N289 Disorder of kidney and ureter, unspecified: Secondary | ICD-10-CM | POA: Diagnosis not present

## 2024-03-09 DIAGNOSIS — I3139 Other pericardial effusion (noninflammatory): Secondary | ICD-10-CM | POA: Insufficient documentation

## 2024-03-09 DIAGNOSIS — I120 Hypertensive chronic kidney disease with stage 5 chronic kidney disease or end stage renal disease: Secondary | ICD-10-CM | POA: Insufficient documentation

## 2024-03-09 DIAGNOSIS — G4733 Obstructive sleep apnea (adult) (pediatric): Secondary | ICD-10-CM | POA: Diagnosis not present

## 2024-03-09 DIAGNOSIS — D72829 Elevated white blood cell count, unspecified: Secondary | ICD-10-CM | POA: Diagnosis not present

## 2024-03-09 DIAGNOSIS — N400 Enlarged prostate without lower urinary tract symptoms: Secondary | ICD-10-CM | POA: Diagnosis not present

## 2024-03-09 DIAGNOSIS — J45909 Unspecified asthma, uncomplicated: Secondary | ICD-10-CM | POA: Diagnosis not present

## 2024-03-09 DIAGNOSIS — D649 Anemia, unspecified: Secondary | ICD-10-CM | POA: Diagnosis not present

## 2024-03-09 DIAGNOSIS — G2581 Restless legs syndrome: Secondary | ICD-10-CM | POA: Insufficient documentation

## 2024-03-09 DIAGNOSIS — N186 End stage renal disease: Secondary | ICD-10-CM

## 2024-03-09 DIAGNOSIS — R0602 Shortness of breath: Secondary | ICD-10-CM | POA: Diagnosis present

## 2024-03-09 DIAGNOSIS — F1090 Alcohol use, unspecified, uncomplicated: Secondary | ICD-10-CM | POA: Diagnosis not present

## 2024-03-09 DIAGNOSIS — C649 Malignant neoplasm of unspecified kidney, except renal pelvis: Secondary | ICD-10-CM

## 2024-03-09 DIAGNOSIS — I10 Essential (primary) hypertension: Secondary | ICD-10-CM

## 2024-03-09 DIAGNOSIS — Z87891 Personal history of nicotine dependence: Secondary | ICD-10-CM | POA: Insufficient documentation

## 2024-03-09 DIAGNOSIS — R079 Chest pain, unspecified: Secondary | ICD-10-CM | POA: Diagnosis present

## 2024-03-09 DIAGNOSIS — Z992 Dependence on renal dialysis: Secondary | ICD-10-CM

## 2024-03-09 DIAGNOSIS — R0789 Other chest pain: Secondary | ICD-10-CM | POA: Diagnosis not present

## 2024-03-09 DIAGNOSIS — Z79899 Other long term (current) drug therapy: Secondary | ICD-10-CM | POA: Diagnosis not present

## 2024-03-09 DIAGNOSIS — N2889 Other specified disorders of kidney and ureter: Secondary | ICD-10-CM

## 2024-03-09 LAB — IRON AND TIBC
Iron: 22 ug/dL — ABNORMAL LOW (ref 45–182)
Saturation Ratios: 12 % — ABNORMAL LOW (ref 17.9–39.5)
TIBC: 179 ug/dL — ABNORMAL LOW (ref 250–450)
UIBC: 158 ug/dL

## 2024-03-09 LAB — ABO/RH: ABO/RH(D): O POS

## 2024-03-09 LAB — CBC
HCT: 22.7 % — ABNORMAL LOW (ref 39.0–52.0)
Hemoglobin: 7.2 g/dL — ABNORMAL LOW (ref 13.0–17.0)
MCH: 28.1 pg (ref 26.0–34.0)
MCHC: 31.7 g/dL (ref 30.0–36.0)
MCV: 88.7 fL (ref 80.0–100.0)
Platelets: 405 K/uL — ABNORMAL HIGH (ref 150–400)
RBC: 2.56 MIL/uL — ABNORMAL LOW (ref 4.22–5.81)
RDW: 14.5 % (ref 11.5–15.5)
WBC: 14.6 K/uL — ABNORMAL HIGH (ref 4.0–10.5)
nRBC: 0 % (ref 0.0–0.2)

## 2024-03-09 LAB — VITAMIN B12: Vitamin B-12: 279 pg/mL (ref 180–914)

## 2024-03-09 LAB — FOLATE: Folate: <3 ng/mL — ABNORMAL LOW (ref >5.9)

## 2024-03-09 LAB — COMPREHENSIVE METABOLIC PANEL WITH GFR
ALT: <5 U/L (ref 0–44)
AST: 14 U/L — ABNORMAL LOW (ref 15–41)
Albumin: 3.6 g/dL (ref 3.5–5.0)
Alkaline Phosphatase: 71 U/L (ref 38–126)
Anion gap: 10 (ref 5–15)
BUN: 11 mg/dL (ref 6–20)
CO2: 33 mmol/L — ABNORMAL HIGH (ref 22–32)
Calcium: 8.8 mg/dL — ABNORMAL LOW (ref 8.9–10.3)
Chloride: 91 mmol/L — ABNORMAL LOW (ref 98–111)
Creatinine, Ser: 4.25 mg/dL — ABNORMAL HIGH (ref 0.61–1.24)
GFR, Estimated: 16 mL/min — ABNORMAL LOW (ref >60)
Glucose, Bld: 164 mg/dL — ABNORMAL HIGH (ref 70–99)
Potassium: 3.5 mmol/L (ref 3.5–5.1)
Sodium: 134 mmol/L — ABNORMAL LOW (ref 135–145)
Total Bilirubin: 0.4 mg/dL (ref 0.0–1.2)
Total Protein: 8.1 g/dL (ref 6.5–8.1)

## 2024-03-09 LAB — TROPONIN T, HIGH SENSITIVITY
Troponin T High Sensitivity: 36 ng/L — ABNORMAL HIGH (ref 0–19)
Troponin T High Sensitivity: 38 ng/L — ABNORMAL HIGH (ref 0–19)

## 2024-03-09 LAB — PREPARE RBC (CROSSMATCH)

## 2024-03-09 LAB — RETICULOCYTES
Immature Retic Fract: 13.1 % (ref 2.3–15.9)
RBC.: 2.37 MIL/uL — ABNORMAL LOW (ref 4.22–5.81)
Retic Count, Absolute: 44.3 K/uL (ref 19.0–186.0)
Retic Ct Pct: 1.9 % (ref 0.4–3.1)

## 2024-03-09 LAB — FERRITIN: Ferritin: 1100 ng/mL — ABNORMAL HIGH (ref 24–336)

## 2024-03-09 LAB — SEDIMENTATION RATE: Sed Rate: 85 mm/h — ABNORMAL HIGH (ref 0–16)

## 2024-03-09 LAB — PRO BRAIN NATRIURETIC PEPTIDE: Pro Brain Natriuretic Peptide: 6186 pg/mL — ABNORMAL HIGH (ref <300.0)

## 2024-03-09 LAB — POC OCCULT BLOOD, ED: Fecal Occult Bld: NEGATIVE

## 2024-03-09 LAB — C-REACTIVE PROTEIN: CRP: 7.3 mg/dL — ABNORMAL HIGH (ref <1.0)

## 2024-03-09 MED ORDER — HYDROMORPHONE HCL 1 MG/ML IJ SOLN
0.5000 mg | INTRAMUSCULAR | Status: DC | PRN
  Administered 2024-03-09 – 2024-03-10 (×2): 1 mg via INTRAVENOUS
  Filled 2024-03-09 (×2): qty 1

## 2024-03-09 MED ORDER — FUROSEMIDE 10 MG/ML IJ SOLN
80.0000 mg | Freq: Once | INTRAMUSCULAR | Status: AC
  Administered 2024-03-09: 80 mg via INTRAVENOUS
  Filled 2024-03-09: qty 8

## 2024-03-09 MED ORDER — DIPHENHYDRAMINE HCL 25 MG PO CAPS
25.0000 mg | ORAL_CAPSULE | Freq: Once | ORAL | Status: AC
  Administered 2024-03-09: 25 mg via ORAL
  Filled 2024-03-09: qty 1

## 2024-03-09 MED ORDER — SODIUM CHLORIDE 0.9% IV SOLUTION: Freq: Once | INTRAVENOUS | Status: AC

## 2024-03-09 MED ORDER — MORPHINE SULFATE (PF) 4 MG/ML IV SOLN
4.0000 mg | Freq: Once | INTRAVENOUS | Status: AC
  Administered 2024-03-09: 4 mg via INTRAVENOUS
  Filled 2024-03-09: qty 1

## 2024-03-09 MED ORDER — IOHEXOL 350 MG/ML SOLN
75.0000 mL | Freq: Once | INTRAVENOUS | Status: AC | PRN
  Administered 2024-03-09: 75 mL via INTRAVENOUS

## 2024-03-09 MED ORDER — HYDRALAZINE HCL 20 MG/ML IJ SOLN: 10.0000 mg | Freq: Four times a day (QID) | INTRAMUSCULAR | Status: DC | PRN

## 2024-03-09 MED ORDER — SENNOSIDES-DOCUSATE SODIUM 8.6-50 MG PO TABS: 1.0000 | ORAL_TABLET | Freq: Every evening | ORAL | Status: DC | PRN

## 2024-03-09 MED ORDER — ONDANSETRON HCL 4 MG/2ML IJ SOLN
4.0000 mg | Freq: Once | INTRAMUSCULAR | Status: AC
  Administered 2024-03-09: 4 mg via INTRAVENOUS
  Filled 2024-03-09: qty 2

## 2024-03-09 MED ORDER — SODIUM CHLORIDE 0.9% FLUSH
3.0000 mL | Freq: Two times a day (BID) | INTRAVENOUS | Status: DC
  Administered 2024-03-09 – 2024-03-10 (×2): 3 mL via INTRAVENOUS

## 2024-03-09 MED ORDER — ALBUTEROL SULFATE (2.5 MG/3ML) 0.083% IN NEBU
3.0000 mL | INHALATION_SOLUTION | Freq: Four times a day (QID) | RESPIRATORY_TRACT | Status: DC | PRN
Start: 2024-03-09 — End: 2024-03-11

## 2024-03-09 MED ORDER — ONDANSETRON HCL 4 MG/2ML IJ SOLN: 4.0000 mg | Freq: Four times a day (QID) | INTRAMUSCULAR | Status: DC | PRN

## 2024-03-09 MED ORDER — FOLIC ACID 1 MG PO TABS
1.0000 mg | ORAL_TABLET | Freq: Every day | ORAL | Status: DC
  Administered 2024-03-09 – 2024-03-10 (×2): 1 mg via ORAL
  Filled 2024-03-09 (×2): qty 1

## 2024-03-09 MED ORDER — CALCITRIOL 0.25 MCG PO CAPS
0.2500 ug | ORAL_CAPSULE | Freq: Every day | ORAL | Status: DC
  Administered 2024-03-10: 0.25 ug via ORAL
  Filled 2024-03-09: qty 1

## 2024-03-09 MED ORDER — TAMSULOSIN HCL 0.4 MG PO CAPS
0.8000 mg | ORAL_CAPSULE | Freq: Every day | ORAL | Status: DC
  Administered 2024-03-10: 0.8 mg via ORAL
  Filled 2024-03-09: qty 2

## 2024-03-09 MED ORDER — OXYCODONE HCL 5 MG PO TABS: 5.0000 mg | ORAL_TABLET | ORAL | Status: DC | PRN

## 2024-03-09 MED ORDER — FINASTERIDE 5 MG PO TABS
5.0000 mg | ORAL_TABLET | Freq: Every day | ORAL | Status: DC
  Administered 2024-03-10: 5 mg via ORAL
  Filled 2024-03-09: qty 1

## 2024-03-09 MED ORDER — ACETAMINOPHEN 325 MG PO TABS
650.0000 mg | ORAL_TABLET | Freq: Once | ORAL | Status: AC
  Administered 2024-03-09: 650 mg via ORAL
  Filled 2024-03-09: qty 2

## 2024-03-09 MED ORDER — ONDANSETRON HCL 4 MG PO TABS: 4.0000 mg | ORAL_TABLET | Freq: Four times a day (QID) | ORAL | Status: DC | PRN

## 2024-03-09 MED ORDER — SORBITOL 70 % SOLN: 30.0000 mL | Freq: Every day | Status: DC | PRN

## 2024-03-09 MED ORDER — AMLODIPINE BESYLATE 5 MG PO TABS
10.0000 mg | ORAL_TABLET | Freq: Every day | ORAL | Status: DC
  Administered 2024-03-09 – 2024-03-10 (×2): 10 mg via ORAL
  Filled 2024-03-09 (×2): qty 2

## 2024-03-09 MED ORDER — CYANOCOBALAMIN 500 MCG PO TABS: 500.0000 ug | ORAL_TABLET | Freq: Every day | ORAL | Status: DC

## 2024-03-09 NOTE — ED Provider Notes (Signed)
 [place] [facility] AT [facility] Provider Note   CSN: [id number] Arrival date & time: 03/09/24  1325     Patient presents with: Chest Pain   Dylan Sweeney is a 53 y.o. male.    Chest Pain    Patient has a history of hypertension and peripheral vascular disease, chronic renal failure on dialysis.  Patient states he was diagnosed with kidney cancer recently.  That was in October.  Patient has been having some pain on the left side.  Patient has been also has experiencing pain in the left side of his chest for the last 3 weeks.  Patient was seen in another medical facility prescribed muscle relaxers and tramadol.  Patient states he does not feel like the pain has gotten any better.  He is not having any fevers or coughing.  He states he is having some shortness of breath and the pain increases with breathing.   Prior to Admission medications   Medication Sig Start Date End Date Taking? Authorizing Provider  albuterol  (PROVENTIL  HFA;VENTOLIN  HFA) 108 (90 BASE) MCG/ACT inhaler Inhale 2 puffs into the lungs every 6 (six) hours as needed for wheezing or shortness of breath.     [provider]  amLODipine  (NORVASC ) 10 MG tablet Take 1 tablet (10 mg total) by mouth daily. 03/19/15   Abrol, Nayana, MD  calcitRIOL  (ROCALTROL ) 0.25 MCG capsule Take 0.25 mcg by mouth daily.    [provider]  finasteride (PROSCAR) 5 MG tablet Take 5 mg by mouth daily.    [provider]  folic acid  (FOLVITE ) 1 MG tablet Take 1 tablet (1 mg total) by mouth daily. 03/19/15   Abrol, Nayana, MD  folic acid -vitamin b complex-vitamin c-selenium-zinc (DIALYVITE) 3 MG TABS tablet Take 1 tablet by mouth daily.    [provider]  hydrALAZINE  (APRESOLINE ) 100 MG tablet Take 1 tablet (100 mg total) by mouth every 8 (eight) hours. Patient not taking: Reported on 10/19/2021 03/19/15   Abrol, Nayana, MD  HYDROcodone -acetaminophen  (NORCO/VICODIN) 5-325 MG tablet Take  1 tablet by mouth every 6 (six) hours as needed for moderate pain or severe pain. 10/23/21   Briana Elgin LABOR, MD  HYDROcodone -acetaminophen  (NORCO/VICODIN) 5-325 MG tablet Take 1 tablet by mouth every 4 (four) hours as needed. 11/01/21   Haviland, Julie, MD  labetalol  (NORMODYNE ) 300 MG tablet Take 300 mg by mouth 3 (three) times daily.    [provider]  tamsulosin  (FLOMAX ) 0.4 MG CAPS capsule Take 2 capsules (0.8 mg total) by mouth daily after supper. 10/23/21   Briana Elgin LABOR, MD  vitamin B-12 (CYANOCOBALAMIN) 500 MCG tablet Take 500 mcg by mouth daily.    [provider]    Allergies: Lisinopril and Other    Review of Systems  Cardiovascular:  Positive for chest pain.    Updated Vital Signs BP (!) 144/98 (BP Location: Right Arm)   Pulse 97   Temp 98.6 F (37 C) (Oral)   Resp 18   SpO2 98%   Physical Exam Vitals and nursing note reviewed.  Constitutional:      General: He is not in acute distress.    Appearance: He is well-developed.  HENT:     Head: Normocephalic and atraumatic.     Right Ear: External ear normal.     Left Ear: External ear normal.  Eyes:     General: No scleral icterus.       Right eye: No discharge.  Left eye: No discharge.     Conjunctiva/sclera: Conjunctivae normal.  Neck:     Trachea: No tracheal deviation.  Cardiovascular:     Rate and Rhythm: Regular rhythm. Tachycardia present.  Pulmonary:     Effort: Pulmonary effort is normal. No respiratory distress.     Breath sounds: Normal breath sounds. No stridor. No wheezing or rales.  Chest:     Chest wall: Tenderness present. No mass.  Abdominal:     General: Bowel sounds are normal. There is no distension.     Palpations: Abdomen is soft.     Tenderness: There is no abdominal tenderness. There is no guarding or rebound.  Musculoskeletal:        General: No tenderness or deformity.     Cervical back: Neck supple.  Skin:    General: Skin is warm and dry.     Findings: No  rash.  Neurological:     General: No focal deficit present.     Mental Status: He is alert.     Cranial Nerves: No cranial nerve deficit, dysarthria or facial asymmetry.     Sensory: No sensory deficit.     Motor: No abnormal muscle tone or seizure activity.     Coordination: Coordination normal.  Psychiatric:        Mood and Affect: Mood normal.     (all labs ordered are listed, but only abnormal results are displayed) Labs Reviewed  CBC - Abnormal; Notable for the following components:      Result Value   WBC 14.6 (*)    RBC 2.56 (*)    Hemoglobin 7.2 (*)    HCT 22.7 (*)    Platelets 405 (*)    All other components within normal limits  COMPREHENSIVE METABOLIC PANEL WITH GFR - Abnormal; Notable for the following components:   Sodium 134 (*)    Chloride 91 (*)    CO2 33 (*)    Glucose, Bld 164 (*)    Creatinine, Ser 4.25 (*)    Calcium  8.8 (*)    AST 14 (*)    GFR, Estimated 16 (*)    All other components within normal limits  TROPONIN T, HIGH SENSITIVITY - Abnormal; Notable for the following components:   Troponin T High Sensitivity 36 (*)    All other components within normal limits  TROPONIN T, HIGH SENSITIVITY - Abnormal; Notable for the following components:   Troponin T High Sensitivity 38 (*)    All other components within normal limits  VITAMIN B12  FOLATE  IRON AND TIBC  FERRITIN  RETICULOCYTES  PRO BRAIN NATRIURETIC PEPTIDE  POC OCCULT BLOOD, ED  TYPE AND SCREEN    EKG: EKG Interpretation Date/Time:  Thursday March 09 2024 13:29:36 EST Ventricular Rate:  121 PR Interval:  145 QRS Duration:  87 QT Interval:  305 QTC Calculation: 433 R Axis:   74  Text Interpretation: Sinus tachycardia Probable left atrial enlargement Probable LVH with secondary repol abnrm , new Anterior Q waves, possibly due to LVH  Since last tracing rate faster  Confirmed by Randol Simmonds [phone]) on 03/09/2024 1:34:15 PM  Radiology: CT Angio Chest PE W and/or Wo  Contrast Result Date: 03/09/2024 CLINICAL DATA:  Recent diagnosis of renal cell cancer left-sided chest pain short of breath EXAM: CT ANGIOGRAPHY CHEST WITH CONTRAST TECHNIQUE: Multidetector CT imaging of the chest was performed using the standard protocol during bolus administration of intravenous contrast. Multiplanar CT image reconstructions and MIPs were obtained to evaluate  the vascular anatomy. RADIATION DOSE REDUCTION: This exam was performed according to the departmental dose-optimization program which includes automated exposure control, adjustment of the mA and/or kV according to patient size and/or use of iterative reconstruction technique. CONTRAST:  75mL OMNIPAQUE  IOHEXOL  350 MG/ML SOLN COMPARISON:  Chest x-ray 03/09/2024, CT chest 03/12/2015 FINDINGS: Cardiovascular: Satisfactory opacification of the pulmonary arteries to the segmental level. No evidence of pulmonary embolism. Nonaneurysmal aorta. Common origin of right brachiocephalic and left common carotid vessels. Direct origin of left vertebral artery from the distal arch. No dissection. Multi-vessel coronary vascular calcification. Cardiomegaly. Small circumferential pericardial effusion measuring up to 12 mm. Pericardial fluid demonstrates increased density values Mediastinum/Nodes: Patent trachea. No suspicious thyroid mass. Subcentimeter mediastinal lymph nodes. Esophagus within normal limits Lungs/Pleura: Trace left-sided pleural effusion. No focal airspace disease or pneumothorax. Upper Abdomen: No acute findings. Low-density in the upper pole left kidney and lobulated contour of the right kidney, series 5, image 173, further assessment limited without contrast. Musculoskeletal: No acute osseous abnormality Review of the MIP images confirms the above findings. IMPRESSION: 1. Negative for acute pulmonary embolus or aortic dissection. 2. Cardiomegaly with small circumferential pericardial effusion. Pericardial fluid demonstrates increased  density values, suggesting possible complex fluid. 3. Trace left-sided pleural effusion. 4. Low-density in the upper pole left kidney and lobulated contour of the right kidney, further assessment limited without contrast. Correlate with the patient's more recent prior imaging given clinical history. Electronically Signed   By: Luke Bun M.D.   On: 03/09/2024 16:12   DG Chest 2 View Result Date: 03/09/2024 EXAM: 2 VIEW(S) XRAY OF THE CHEST 03/09/2024 01:49:00 PM COMPARISON: 12/25/2023 CLINICAL HISTORY: cp. sob FINDINGS: LUNGS AND PLEURA: No focal pulmonary opacity. No pulmonary edema. No pleural effusion. No pneumothorax. HEART AND MEDIASTINUM: No acute abnormality of the cardiac and mediastinal silhouettes. BONES AND SOFT TISSUES: No acute osseous abnormality. IMPRESSION: 1. No acute cardiopulmonary abnormality. Electronically signed by: Lynwood Seip MD 03/09/2024 02:17 PM EST RP Workstation: [id number]     Procedures   Medications Ordered in the ED  morphine (PF) 4 MG/ML injection 4 mg (4 mg Intravenous Given 03/09/24 1357)  ondansetron  (ZOFRAN ) injection 4 mg (4 mg Intravenous Given 03/09/24 1357)  iohexol  (OMNIPAQUE ) 350 MG/ML injection 75 mL (75 mLs Intravenous Contrast Given 03/09/24 1538)    Clinical Course as of 03/09/24 1654  Thu Mar 09, 2024  1531 CBC(!) Anemia noted hemoglobin decreased compared to previous [JK]  1531 Comprehensive metabolic panel(!) Consistent with chronic renal failure [JK]  1531 Chest x-ray without acute findings [JK]  1653 Discussed with cardiology, Trish.  Cardiology team will see patient tomorrow in consultation [JK]    Clinical Course User Index [JK] Randol Simmonds, MD                                 Medical Decision Making Differential diagnosis includes but not limited to pain associated with his renal cell carcinoma, pulmonary embolism, known metastatic lung disease, pneumonia, pneumothorax, ACS  Problems Addressed: Anemia, unspecified type: acute  illness or injury Iron deficiency anemia, unspecified iron deficiency anemia type: undiagnosed new problem with uncertain prognosis  Amount and/or Complexity of Data Reviewed Labs: ordered. Decision-making details documented in ED Course. Radiology: ordered and independent interpretation performed.  Risk Prescription drug management.   Patient presented to the emergency room for evaluation of chest pain and shortness of breath.  Patient recently diagnosed with probable renal cell carcinoma.  Patient is a dialysis patient on chronic dialysis.  CT scan was performed and there is no signs of acute PE.  No signs of malignancy.  Patient however does have findings suggestive of pericardial effusion.  Patient also had to have slightly low troponins.  Noted to be acutely anemic compared to previous values.  Hemoccult was performed.  No gross blood noted.  Hemoccult pending.  With his new anemia pericardial effusion shortness of breath we will plan admission to the hospital for further treatment.  Cardiology has been consulted.  Patient does not require any emergent dialysis but he was in the hospital for a more prolonged period of time may end up needing his routine dialysis.  Dr Ruthe will discuss with hospitalist secvice    Final diagnoses:  Iron deficiency anemia, unspecified iron deficiency anemia type  Pericardial effusion without cardiac tamponade  Chronic kidney disease, unspecified CKD stage  Renal cell carcinoma, unspecified laterality [facility])    ED Discharge Orders     None          Randol Simmonds, MD 03/09/24 1657

## 2024-03-09 NOTE — ED Triage Notes (Signed)
 C/o chest pain for 3 weeks. Seen at [facility] and prescribed muscle relaxers and tramadol without relief. Last week found out he had kidney cancer. IS concerned it may be in his chest as well. Some SOB but speaking in full sentences without issue. States his HR is always high. No other cardiac or lung disorder hx.

## 2024-03-09 NOTE — ED Notes (Signed)
 Pt said he is unable to give urine sample at this time

## 2024-03-09 NOTE — H&P (Signed)
 History and Physical    Dylan Sweeney FMW:[id number] DOB: [birth year] DOA: 03/09/2024  PCP: [facility]  Patient coming from: Home  I have personally briefly reviewed patient's old medical records in [facility] Link  Chief Complaint: Chest pain  HPI: Dylan Sweeney is a 53 y.o. male with medical history significant of ESRD on hemodialysis (diagnosed April 2024) with HD on TTS, hypertension, restless leg syndrome, BPH, OSA, recent diagnosis of a kidney mass being seen by urology/oncology ([facility]) and patient scheduled for nephrectomy 05/24/2024, presenting to the ED with a 3-week history of constant left-sided chest pain with some associated shortness of breath with worsening chest pain on deep inspiration per patient.  Patient denies any worsening of symptoms with positional changes.  Patient states was seen at [facility] ED in [place] was given some tramadol and muscle relaxants however with no significant improvement presented to the ED.  Patient denies any fevers, no chills, no nausea, no vomiting, no abdominal pain, no diarrhea, no constipation, no dysuria, no melena, no hematemesis, no hematochezia, no syncopal episode.  Patient denies any recent upper respiratory infection.  ED Course: Patient seen in the ED, CBC obtained with a white count of 14.6, hemoglobin of 7.2, platelet count of 405.  Comprehensive metabolic profile with a sodium of 134, chloride of 91, bicarb of 33, glucose of 164, creatinine of 4.25, calcium  of 8.8 otherwise within normal limits.  High-sensitivity troponin elevated at 36>> 38 but flattened.  EKG with a sinus tachycardia with heart rate of 121, LVH with some T wave inversions in leads V4 through V6.  CT angiogram chest done negative for PE or aortic dissection.  Cardiomegaly with small circumferential pericardial effusion.  Pericardial fluid demonstrates increased density values, suggesting possible complex fluid.  Trace left-sided  pleural effusion.  Low density in the upper pole left kidney and lobulated contour of the right kidney, further assessment limited without contrast.  Chest x-ray done with no acute cardiopulmonary abnormality.  FOBT negative.  Review of Systems: As per HPI otherwise all other systems reviewed and are negative.  Past Medical History:  Diagnosis Date   Asthma    Depression    PTSD   Enlarged heart    History of kidney stones    in kidneys - no problems per patient 02/02/20   Hypertension    Peripheral vascular disease    Renal disorder    stage 4   Renal insufficiency    Sickle cell anemia (HCC)    Has the trait-per patient (03/12/15)   Sickle cell trait    Sleep apnea    Per pt on 02/02/20 - uses 2-3 x week as needed when patient can not sleep    Past Surgical History:  Procedure Laterality Date   AV FISTULA PLACEMENT Left 02/05/2020   Procedure: ARTERIOVENOUS (AV) FISTULA CREATION LEFT;  Surgeon: Gretta Lonni PARAS, MD;  Location: [facility] OR;  Service: Vascular;  Laterality: Left;   NO PAST SURGERIES      Social History  reports that he has quit smoking. His smoking use included cigarettes. He has never used smokeless tobacco. He reports current alcohol use of about 6.0 standard drinks of alcohol per week. He reports current drug use. Drug: Marijuana.  Allergies  Allergen Reactions   Lisinopril Cough   Other Other (See Comments)    G6 PD pill allergy : pill given before travel in [place]    Family History  Problem Relation Age of Onset  Heart disease Mother    Hypertension Sister    Asthma Sister    Hypertension Brother    Mother deceased age 68 from acute MI.  Father deceased in his 4s from alcoholism.  Prior to Admission medications   Medication Sig Start Date End Date Taking? Authorizing Provider  albuterol  (PROVENTIL  HFA;VENTOLIN  HFA) 108 (90 BASE) MCG/ACT inhaler Inhale 2 puffs into the lungs every 6 (six) hours as needed for wheezing or shortness of breath.      [provider]  amLODipine  (NORVASC ) 10 MG tablet Take 1 tablet (10 mg total) by mouth daily. 03/19/15   Abrol, Nayana, MD  calcitRIOL  (ROCALTROL ) 0.25 MCG capsule Take 0.25 mcg by mouth daily.    [provider]  finasteride (PROSCAR) 5 MG tablet Take 5 mg by mouth daily.    [provider]  folic acid  (FOLVITE ) 1 MG tablet Take 1 tablet (1 mg total) by mouth daily. 03/19/15   Abrol, Nayana, MD  folic acid -vitamin b complex-vitamin c-selenium-zinc (DIALYVITE) 3 MG TABS tablet Take 1 tablet by mouth daily.    [provider]  hydrALAZINE  (APRESOLINE ) 100 MG tablet Take 1 tablet (100 mg total) by mouth every 8 (eight) hours. Patient not taking: Reported on 10/19/2021 03/19/15   Abrol, Nayana, MD  HYDROcodone -acetaminophen  (NORCO/VICODIN) 5-325 MG tablet Take 1 tablet by mouth every 6 (six) hours as needed for moderate pain or severe pain. 10/23/21   Briana Elgin LABOR, MD  HYDROcodone -acetaminophen  (NORCO/VICODIN) 5-325 MG tablet Take 1 tablet by mouth every 4 (four) hours as needed. 11/01/21   Haviland, Julie, MD  labetalol  (NORMODYNE ) 300 MG tablet Take 300 mg by mouth 3 (three) times daily.    [provider]  tamsulosin  (FLOMAX ) 0.4 MG CAPS capsule Take 2 capsules (0.8 mg total) by mouth daily after supper. 10/23/21   Briana Elgin LABOR, MD  vitamin B-12 (CYANOCOBALAMIN) 500 MCG tablet Take 500 mcg by mouth daily.    [provider]    Physical Exam: Vitals:   03/09/24 1330 03/09/24 1620  BP: 125/67 (!) 144/98  Pulse: (!) 124 97  Resp: 16 18  Temp: 99.6 F (37.6 C) 98.6 F (37 C)  TempSrc: Oral Oral  SpO2: 99% 98%    Constitutional: NAD, calm, comfortable Vitals:   03/09/24 1330 03/09/24 1620  BP: 125/67 (!) 144/98  Pulse: (!) 124 97  Resp: 16 18  Temp: 99.6 F (37.6 C) 98.6 F (37 C)  TempSrc: Oral Oral  SpO2: 99% 98%   Eyes: PERRL, lids and conjunctivae normal ENMT: Mucous membranes are moist. Posterior pharynx clear of any  exudate or lesions.Normal dentition.  Neck: normal, supple, no masses, no thyromegaly Respiratory: clear to auscultation bilaterally, no wheezing, no crackles. Normal respiratory effort. No accessory muscle use.  Cardiovascular: Regular rate and rhythm, no murmurs / rubs / gallops. No extremity edema. 2+ pedal pulses. No carotid bruits.  Abdomen: no tenderness, no masses palpated. No hepatosplenomegaly. Bowel sounds positive.  Musculoskeletal: no clubbing / cyanosis. No joint deformity upper and lower extremities. Good ROM, no contractures. Normal muscle tone.  Skin: no rashes, lesions, ulcers. No induration Neurologic: CN 2-12 grossly intact. Sensation intact, DTR normal. Strength 5/5 in all 4.  Psychiatric: Normal judgment and insight. Alert and oriented x 3. Normal mood.   Labs on Admission: I have personally reviewed following labs and imaging studies  CBC: Recent Labs  Lab 03/09/24 1351  WBC 14.6*  HGB 7.2*  HCT 22.7*  MCV 88.7  PLT 405*  Basic Metabolic Panel: Recent Labs  Lab 03/09/24 1351  NA 134*  K 3.5  CL 91*  CO2 33*  GLUCOSE 164*  BUN 11  CREATININE 4.25*  CALCIUM  8.8*    GFR: CrCl cannot be calculated (Unknown ideal weight.).  Liver Function Tests: Recent Labs  Lab 03/09/24 1351  AST 14*  ALT <5  ALKPHOS 71  BILITOT 0.4  PROT 8.1  ALBUMIN  3.6    Urine analysis:    Component Value Date/Time   COLORURINE STRAW (A) 11/01/2021 2003   APPEARANCEUR CLEAR 11/01/2021 2003   LABSPEC 1.004 (L) 11/01/2021 2003   PHURINE 6.0 11/01/2021 2003   GLUCOSEU 50 (A) 11/01/2021 2003   HGBUR SMALL (A) 11/01/2021 2003   BILIRUBINUR NEGATIVE 11/01/2021 2003   KETONESUR NEGATIVE 11/01/2021 2003   PROTEINUR 30 (A) 11/01/2021 2003   UROBILINOGEN 1.0 03/15/2015 1034   NITRITE NEGATIVE 11/01/2021 2003   LEUKOCYTESUR TRACE (A) 11/01/2021 2003    Radiological Exams on Admission: CT Angio Chest PE W and/or Wo Contrast Result Date: 03/09/2024 CLINICAL DATA:   Recent diagnosis of renal cell cancer left-sided chest pain short of breath EXAM: CT ANGIOGRAPHY CHEST WITH CONTRAST TECHNIQUE: Multidetector CT imaging of the chest was performed using the standard protocol during bolus administration of intravenous contrast. Multiplanar CT image reconstructions and MIPs were obtained to evaluate the vascular anatomy. RADIATION DOSE REDUCTION: This exam was performed according to the departmental dose-optimization program which includes automated exposure control, adjustment of the mA and/or kV according to patient size and/or use of iterative reconstruction technique. CONTRAST:  75mL OMNIPAQUE  IOHEXOL  350 MG/ML SOLN COMPARISON:  Chest x-ray 03/09/2024, CT chest 03/12/2015 FINDINGS: Cardiovascular: Satisfactory opacification of the pulmonary arteries to the segmental level. No evidence of pulmonary embolism. Nonaneurysmal aorta. Common origin of right brachiocephalic and left common carotid vessels. Direct origin of left vertebral artery from the distal arch. No dissection. Multi-vessel coronary vascular calcification. Cardiomegaly. Small circumferential pericardial effusion measuring up to 12 mm. Pericardial fluid demonstrates increased density values Mediastinum/Nodes: Patent trachea. No suspicious thyroid mass. Subcentimeter mediastinal lymph nodes. Esophagus within normal limits Lungs/Pleura: Trace left-sided pleural effusion. No focal airspace disease or pneumothorax. Upper Abdomen: No acute findings. Low-density in the upper pole left kidney and lobulated contour of the right kidney, series 5, image 173, further assessment limited without contrast. Musculoskeletal: No acute osseous abnormality Review of the MIP images confirms the above findings. IMPRESSION: 1. Negative for acute pulmonary embolus or aortic dissection. 2. Cardiomegaly with small circumferential pericardial effusion. Pericardial fluid demonstrates increased density values, suggesting possible complex fluid. 3.  Trace left-sided pleural effusion. 4. Low-density in the upper pole left kidney and lobulated contour of the right kidney, further assessment limited without contrast. Correlate with the patient's more recent prior imaging given clinical history. Electronically Signed   By: Luke Bun M.D.   On: 03/09/2024 16:12   DG Chest 2 View Result Date: 03/09/2024 EXAM: 2 VIEW(S) XRAY OF THE CHEST 03/09/2024 01:49:00 PM COMPARISON: 12/25/2023 CLINICAL HISTORY: cp. sob FINDINGS: LUNGS AND PLEURA: No focal pulmonary opacity. No pulmonary edema. No pleural effusion. No pneumothorax. HEART AND MEDIASTINUM: No acute abnormality of the cardiac and mediastinal silhouettes. BONES AND SOFT TISSUES: No acute osseous abnormality. IMPRESSION: 1. No acute cardiopulmonary abnormality. Electronically signed by: Lynwood Seip MD 03/09/2024 02:17 PM EST RP Workstation: [id number]    EKG: Independently reviewed.  Sinus tachycardia heart rate 121.  LVH.  T wave inversion V4 through V6.  Assessment/Plan Principal Problem:   Chest pain Active  Problems:   Renal insufficiency   Anemia   Shortness of breath   ESRD (end stage renal disease) on dialysis (HCC)   HTN (hypertension)   OSA (obstructive sleep apnea)   BPH (benign prostatic hyperplasia)   RLS (restless legs syndrome)   Right renal mass   Leukocytosis   #1 chest pain/shortness of breath -Patient presented with a 3-week history of chest pain/shortness of breath constant in nature worsening on deep inspiration.  Patient denies any recent upper respiratory tract infections.  No recent illnesses.  No change with chest pain with positional changes.  Patient denies any fevers. - CT angiogram chest done negative for PE however did show cardiomegaly with a small circumferential pericardial effusion that showed increased density value suggesting possible complex fluid. - Cardiac enzymes elevated but flattened. - Check a 2D echo. - Check a sed rate, CRP. - EDP, Dr.  Ruthe stated spoke with cardiology [facility]) who recommended hospitalist admission and patient will be seen in consultation on 03/10/2024. - Pain management. - Supportive care.  2.  Anemia -Patient noted with a hemoglobin of 7.2 on admission last hemoglobin noted at 14.1(12/25/2023) - Patient denies any overt bleeding. - FOBT done was negative. - Patient recently diagnosed with kidney mass and to undergo nephrectomy by urology on 05/24/2024. - Patient also with ESRD on HD. - Anemia panel ordered by EDP and pending. - Transfuse 1 unit PRBCs and will give Lasix  80 mg IV x 1 posttransfusion.  Discussed with nephrology. - Follow H&H.  3.  Hypertension -Resume home regimen Norvasc  10 mg daily.  4.  ESRD on HD (TTS) - Patient on ESRD on HD since April 2024. - Patient stated underwent his regular hemodialysis today on day of admission. - Anemia panel ordered and pending due to hemoglobin at 7.2 from 14.1(12/25/2023) - Resume home regimen calcitriol . - Nephrology consulted.  5.  Recently diagnosed right renal mass (03/03/2024) - Patient being followed by urology/oncology at [facility] (Dr. Hulda Finder) and patient scheduled for nephrectomy on 05/24/2024. - Outpatient follow-up with urology.  6.  OSA -CPAP nightly.  7.  BPH -Resume home regimen of Proscar, Flomax .  8.  Leukocytosis -CT angiogram chest negative for acute infiltrate. - Patient afebrile. - Check blood cultures x 2. - Check a UA with culture/sensitivities. - Monitor off antibiotics at this time.    DVT prophylaxis: SCDs Code Status:   Full Family Communication:  Updated patient.  No family at bedside. Disposition Plan:   Patient is from:  Home  Anticipated DC to:  Home  Anticipated DC date:  TBD  Anticipated DC barriers: Clinical improvement  Consults called:  Cardiology by EDP ([facility])/nephrology: Dr. Rayburn Admission status:  Place in observation/telemetry/[place]  Severity of  Illness: The appropriate patient status for this patient is OBSERVATION. Observation status is judged to be reasonable and necessary in order to provide the required intensity of service to ensure the patient's safety. The patient's presenting symptoms, physical exam findings, and initial radiographic and laboratory data in the context of their medical condition is felt to place them at decreased risk for further clinical deterioration. Furthermore, it is anticipated that the patient will be medically stable for discharge from the hospital within 2 midnights of admission.     Toribio Hummer MD [facility]  How to contact the [facility] Attending or Consulting provider 7A - 7P or covering provider during after hours 7P -7A, for this patient?   Check the care team in [facility]  and look for a) attending/consulting [facility] provider listed and b) the [facility] team listed Log into [email or web address] and use [place]'s universal password to access. If you do not have the password, please contact the hospital operator. Locate the [facility] provider you are looking for under [facility] and page to a number that you can be directly reached. If you still have difficulty reaching the provider, please page the [facility] (Director on Call) for the Hospitalists listed on amion for assistance.  03/09/2024, 6:24 PM

## 2024-03-10 ENCOUNTER — Observation Stay (HOSPITAL_COMMUNITY)

## 2024-03-10 DIAGNOSIS — R072 Precordial pain: Secondary | ICD-10-CM

## 2024-03-10 DIAGNOSIS — I3139 Other pericardial effusion (noninflammatory): Secondary | ICD-10-CM

## 2024-03-10 DIAGNOSIS — G2581 Restless legs syndrome: Secondary | ICD-10-CM

## 2024-03-10 LAB — CBC WITH DIFFERENTIAL/PLATELET
Abs Immature Granulocytes: 0.06 K/uL (ref 0.00–0.07)
Basophils Absolute: 0 K/uL (ref 0.0–0.1)
Basophils Relative: 0 %
Eosinophils Absolute: 0.2 K/uL (ref 0.0–0.5)
Eosinophils Relative: 1 %
HCT: 23.9 % — ABNORMAL LOW (ref 39.0–52.0)
Hemoglobin: 7.7 g/dL — ABNORMAL LOW (ref 13.0–17.0)
Immature Granulocytes: 1 %
Lymphocytes Relative: 10 %
Lymphs Abs: 1.2 K/uL (ref 0.7–4.0)
MCH: 28.6 pg (ref 26.0–34.0)
MCHC: 32.2 g/dL (ref 30.0–36.0)
MCV: 88.8 fL (ref 80.0–100.0)
Monocytes Absolute: 0.7 K/uL (ref 0.1–1.0)
Monocytes Relative: 6 %
Neutro Abs: 10.8 K/uL — ABNORMAL HIGH (ref 1.7–7.7)
Neutrophils Relative %: 82 %
Platelets: 354 K/uL (ref 150–400)
RBC: 2.69 MIL/uL — ABNORMAL LOW (ref 4.22–5.81)
RDW: 15.3 % (ref 11.5–15.5)
WBC: 13 K/uL — ABNORMAL HIGH (ref 4.0–10.5)
nRBC: 0 % (ref 0.0–0.2)

## 2024-03-10 LAB — COMPREHENSIVE METABOLIC PANEL WITH GFR
ALT: <5 U/L (ref 0–44)
AST: 13 U/L — ABNORMAL LOW (ref 15–41)
Albumin: 3.3 g/dL — ABNORMAL LOW (ref 3.5–5.0)
Alkaline Phosphatase: 61 U/L (ref 38–126)
Anion gap: 11 (ref 5–15)
BUN: 21 mg/dL — ABNORMAL HIGH (ref 6–20)
CO2: 30 mmol/L (ref 22–32)
Calcium: 8.4 mg/dL — ABNORMAL LOW (ref 8.9–10.3)
Chloride: 89 mmol/L — ABNORMAL LOW (ref 98–111)
Creatinine, Ser: 6.7 mg/dL — ABNORMAL HIGH (ref 0.61–1.24)
GFR, Estimated: 9 mL/min — ABNORMAL LOW (ref >60)
Glucose, Bld: 89 mg/dL (ref 70–99)
Potassium: 3.9 mmol/L (ref 3.5–5.1)
Sodium: 131 mmol/L — ABNORMAL LOW (ref 135–145)
Total Bilirubin: 0.3 mg/dL (ref 0.0–1.2)
Total Protein: 7.3 g/dL (ref 6.5–8.1)

## 2024-03-10 LAB — RENAL FUNCTION PANEL
Albumin: 3.3 g/dL — ABNORMAL LOW (ref 3.5–5.0)
Anion gap: 12 (ref 5–15)
BUN: 21 mg/dL — ABNORMAL HIGH (ref 6–20)
CO2: 30 mmol/L (ref 22–32)
Calcium: 8.4 mg/dL — ABNORMAL LOW (ref 8.9–10.3)
Chloride: 89 mmol/L — ABNORMAL LOW (ref 98–111)
Creatinine, Ser: 6.71 mg/dL — ABNORMAL HIGH (ref 0.61–1.24)
GFR, Estimated: 9 mL/min — ABNORMAL LOW (ref >60)
Glucose, Bld: 91 mg/dL (ref 70–99)
Phosphorus: 4.7 mg/dL — ABNORMAL HIGH (ref 2.5–4.6)
Potassium: 3.9 mmol/L (ref 3.5–5.1)
Sodium: 131 mmol/L — ABNORMAL LOW (ref 135–145)

## 2024-03-10 LAB — ECHOCARDIOGRAM COMPLETE
Area-P 1/2: 6.54 cm2
S' Lateral: 2.6 cm

## 2024-03-10 LAB — PHOSPHORUS: Phosphorus: 4.7 mg/dL — ABNORMAL HIGH (ref 2.5–4.6)

## 2024-03-10 LAB — PREPARE RBC (CROSSMATCH)

## 2024-03-10 LAB — HIV ANTIBODY (ROUTINE TESTING W REFLEX): HIV Screen 4th Generation wRfx: NONREACTIVE

## 2024-03-10 MED ORDER — SODIUM CHLORIDE 0.9% IV SOLUTION: Freq: Once | INTRAVENOUS | Status: AC

## 2024-03-10 MED ORDER — AMLODIPINE BESYLATE 10 MG PO TABS: 10.0000 mg | ORAL_TABLET | Freq: Every day | ORAL | 2 refills | Status: DC

## 2024-03-10 MED ORDER — PANTOPRAZOLE SODIUM 40 MG PO TBEC: 40.0000 mg | DELAYED_RELEASE_TABLET | Freq: Every day | ORAL | 0 refills | Status: AC

## 2024-03-10 MED ORDER — FOLIC ACID 1 MG PO TABS: 1.0000 mg | ORAL_TABLET | Freq: Every day | ORAL | 2 refills | Status: DC

## 2024-03-10 MED ORDER — IBUPROFEN 200 MG PO TABS: 400.0000 mg | ORAL_TABLET | Freq: Two times a day (BID) | ORAL | Status: DC

## 2024-03-10 MED ORDER — HYDRALAZINE HCL 100 MG PO TABS: 100.0000 mg | ORAL_TABLET | Freq: Three times a day (TID) | ORAL | Status: AC

## 2024-03-10 MED ORDER — LABETALOL HCL 100 MG PO TABS
300.0000 mg | ORAL_TABLET | Freq: Three times a day (TID) | ORAL | Status: DC
  Administered 2024-03-10: 300 mg via ORAL
  Filled 2024-03-10: qty 3

## 2024-03-10 MED ORDER — IBUPROFEN 400 MG PO TABS: 400.0000 mg | ORAL_TABLET | Freq: Two times a day (BID) | ORAL | Status: AC

## 2024-03-10 MED ORDER — IBUPROFEN 200 MG PO TABS
400.0000 mg | ORAL_TABLET | Freq: Three times a day (TID) | ORAL | Status: DC
  Administered 2024-03-10: 400 mg via ORAL
  Filled 2024-03-10: qty 2

## 2024-03-10 MED ORDER — HYDRALAZINE HCL 25 MG PO TABS: 100.0000 mg | ORAL_TABLET | Freq: Three times a day (TID) | ORAL | Status: DC

## 2024-03-10 MED ORDER — VITAMIN B-12 500 MCG PO TABS: 500.0000 ug | ORAL_TABLET | Freq: Every day | ORAL | Status: DC

## 2024-03-10 MED ORDER — PANTOPRAZOLE SODIUM 40 MG PO TBEC
40.0000 mg | DELAYED_RELEASE_TABLET | Freq: Every day | ORAL | Status: DC
  Administered 2024-03-10: 40 mg via ORAL
  Filled 2024-03-10: qty 1

## 2024-03-10 NOTE — Care Management CC44 (Signed)
 Condition Code 44 Documentation Completed  Patient Details  Name: Dylan Sweeney MRN: [id number] Date of Birth: July 31, 1970   Condition Code 44 given:   yes Patient signature on Condition Code 44 notice: yes   Documentation of 2 MD's agreement:   yes Code 44 added to claim:   yes    Raydell Maners G., RN 03/10/2024, 6:22 PM

## 2024-03-10 NOTE — Discharge Summary (Addendum)
 Physician Discharge Summary  Dylan Sweeney FMW:[id number] DOB: 1970/05/25 DOA: 03/09/2024  PCP: [facility]  Admit date: 03/09/2024 Discharge date: 03/10/2024  Time spent: 60 minutes  Recommendations for Outpatient Follow-up:  Follow-up with [facility] in 2 weeks.  On follow-up patient's chest pain will need to be reassessed.  Patient will need a basic metabolic profile, CBC done to follow-up on electrolytes and counts.  Blood pressure will need to be reassessed on follow-up. Follow-up at regular hemodialysis center as scheduled on 03/11/2024.   Discharge Diagnoses:  Principal Problem:   Chest pain Active Problems:   Renal insufficiency   Anemia   Shortness of breath   ESRD (end stage renal disease) on dialysis (HCC)   HTN (hypertension)   OSA (obstructive sleep apnea)   BPH (benign prostatic hyperplasia)   RLS (restless legs syndrome)   Right renal mass   Leukocytosis   Pericardial effusion   Discharge Condition: Stable  Diet recommendation: Renal diet  There were no vitals filed for this visit.  History of present illness:  Dylan Sweeney is a 53 y.o. male with medical history significant of ESRD on hemodialysis (diagnosed April 2024) with HD on TTS, hypertension, restless leg syndrome, BPH, OSA, recent diagnosis of a kidney mass being seen by urology/oncology ([facility]) and patient scheduled for nephrectomy 05/24/2024, presenting to the ED with a 3-week history of constant left-sided chest pain with some associated shortness of breath with worsening chest pain on deep inspiration per patient.  Patient denies any worsening of symptoms with positional changes.  Patient states was seen at [facility] ED in [place] was given some tramadol and muscle relaxants however with no significant improvement presented to the ED.  Patient denies any fevers, no chills, no nausea, no vomiting, no abdominal pain, no diarrhea, no constipation, no  dysuria, no melena, no hematemesis, no hematochezia, no syncopal episode.  Patient denies any recent upper respiratory infection.   ED Course: Patient seen in the ED, CBC obtained with a white count of 14.6, hemoglobin of 7.2, platelet count of 405.  Comprehensive metabolic profile with a sodium of 134, chloride of 91, bicarb of 33, glucose of 164, creatinine of 4.25, calcium  of 8.8 otherwise within normal limits.  High-sensitivity troponin elevated at 36>> 38 but flattened.  EKG with a sinus tachycardia with heart rate of 121, LVH with some T wave inversions in leads V4 through V6.  CT angiogram chest done negative for PE or aortic dissection.  Cardiomegaly with small circumferential pericardial effusion.  Pericardial fluid demonstrates increased density values, suggesting possible complex fluid.  Trace left-sided pleural effusion.  Low density in the upper pole left kidney and lobulated contour of the right kidney, further assessment limited without contrast.  Chest x-ray done with no acute cardiopulmonary abnormality.  FOBT negative.  Hospital Course:  #1 chest pain/shortness of breath -Patient presented with a 3-week history of chest pain/shortness of breath constant in nature worsening on deep inspiration.  Patient denies any recent upper respiratory tract infections.  No recent illnesses.  No change with chest pain with positional changes.  Patient denies any fevers. - CT angiogram chest done negative for PE however did show cardiomegaly with a small circumferential pericardial effusion that showed increased density value suggesting possible complex fluid. - Cardiac enzymes elevated but flattened. -Sed rate noted elevated at 85. -CRP elevated at 7.3. -2D echo done with EF of 60 to 65%,NWMA, small pericardial effusion is present and posterior to the  left ventricle.  No evidence of cardiac tamponade.  Mild MVR. -Patient seen in consultation by cardiology that his chest pain was precipitated by  lifting weights a few weeks ago. - EKG done with no signs of ischemia. - Cardiac enzymes minimally elevated by flattened. - Per cardiology patient had a prior stress echo at [facility] 02/2024 which was normal. - Per cardiology doubt any clinical significance of CT findings of pericardial effusion noted and per cardiology if 2D echo was okay patient was cleared for discharge from cardiology's perspective. - Patient will be discharged home on 5 days of ibuprofen  400 mg twice daily in addition to PPI. - Outpatient follow-up with PCP.   2.  Anemia of chronic kidney disease -Patient noted with a hemoglobin of 7.2 on admission last hemoglobin noted at 14.1(12/25/2023) - Patient denied any overt bleeding. - FOBT done was negative. - Patient recently diagnosed with kidney mass and to undergo nephrectomy by urology on 05/24/2024. - Patient also with ESRD on HD. - Anemia panel with iron level of 22, folate <3, ferritin of 1100, TIBC 179, vitamin B12 of 279. -Patient transfused a total of 2 units PRBCs during this hospitalization per nephrology recommendations. -Per nephrology patient has been off his ESA since 01/04/2024 and recommending resumption of ESA in the outpatient setting per nephrology. -Outpatient follow-up with PCP and nephrologist. -Will need repeat labs done on hemodialysis tomorrow, 03/11/2024.   3.  Hypertension - Patient placed back on Norvasc  10 mg daily as well as labetalol  300 mg 3 times daily as noted on med rec.   - Patient noted to be on other antihypertensive medications on day of discharge which will be resumed on discharge.   - Outpatient follow-up with PCP.    4.  ESRD on HD (TTS) - Patient on ESRD on HD since April 2024. - Patient stated underwent his regular hemodialysis on day of admission. - Anemia panel ordered and due to hemoglobin at 7.2 from 14.1(12/25/2023). -Anemia panel with iron level of 22, folate <3, ferritin of 1100, TIBC 179, vitamin B12 of 279. - Resumed home  regimen calcitriol . -Patient seen in consultation by nephrology and patient cleared for discharge after transfusion of 2 units PRBCs with outpatient follow-up at his HD unit at the [facility]. -Outpatient follow-up with primary nephrologist.   5.  Recently diagnosed right renal mass (03/03/2024) - Patient being followed by urology/oncology at [facility] (Dr. Hulda Finder) and patient scheduled for nephrectomy on 05/24/2024. - Outpatient follow-up with urology.   6.  OSA -CPAP nightly.   7.  BPH - Patient placed back on home regimen of Proscar and Flomax  however in review of med rec looks like those have been discontinued.   - Outpatient follow-up with PCP.    8.  Leukocytosis -CT angiogram chest negative for acute infiltrate. - Patient afebrile. - Patient remained afebrile.   - Blood cultures ordered but were pending at time of discharge.  -Outpatient follow-up with PCP.      Procedures: CT angiogram chest 03/09/2024 Chest x-ray 03/09/2024 2D echo 03/10/2024 Transfused 2 units PRBCs.  Consultations: Cardiology: Dr. Delford 03/10/2024 Nephrology: Dr. Jerrye 03/10/2024  Discharge Exam: Vitals:   03/10/24 1611 03/10/24 1819  BP: (!) 181/95 129/71  Pulse: (!) 103 91  Resp: 20 20  Temp: 98.7 F (37.1 C) 98.9 F (37.2 C)  SpO2:      General: NAD Cardiovascular: RRR no murmurs rubs or gallops.  No JVD.  No lower extremity edema Respiratory: Clear to  auscultation bilaterally.  No wheezes, no crackles, no rhonchi.  Fair air movement.  Speaking in full sentences.  Discharge Instructions   Discharge Instructions     Diet renal with fluid restriction   Complete by: As directed    Increase activity slowly   Complete by: As directed    Increase activity slowly   Complete by: As directed       Allergies as of 03/10/2024       Reactions   Clonidine     Other Reaction(s): skin burns   Ace Inhibitors Cough, Other (See Comments)   Other Reaction(s): Cough Other  Reaction(s): cough Couldn't stop coughing   Nsaids Other (See Comments)   Other Reaction(s): Serum creatinine above reference range   Ketorolac  Tromethamine     Other Reaction(s): Serum creatinine above reference range   Lisinopril Cough   Other Other (See Comments)   G6 PD pill allergy : pill given before travel in [employer] (reaction?)        Medication List     TAKE these medications    albuterol  108 (90 Base) MCG/ACT inhaler Commonly known as: VENTOLIN  HFA Inhale 2 puffs into the lungs every 6 (six) hours as needed for wheezing or shortness of breath.   amLODipine  10 MG tablet Commonly known as: NORVASC  Take 1 tablet (10 mg total) by mouth daily.   busPIRone 10 MG tablet Commonly known as: BUSPAR Take 5 mg by mouth 2 (two) times daily.   cinacalcet 30 MG tablet Commonly known as: SENSIPAR Take 30 mg by mouth See admin instructions. Tuesday, Thursday, and Saturday's. Along with 60mg  for total dose of 90mg .   cinacalcet 60 MG tablet Commonly known as: SENSIPAR Take 60 mg by mouth daily.   folic acid  1 MG tablet Commonly known as: FOLVITE  Take 1 tablet (1 mg total) by mouth daily.   hydrALAZINE  100 MG tablet Commonly known as: APRESOLINE  Take 1 tablet (100 mg total) by mouth every 8 (eight) hours.   hydrOXYzine  25 MG tablet Commonly known as: ATARAX  Take 25-50 mg by mouth 3 (three) times daily as needed for anxiety.   ibuprofen  400 MG tablet Commonly known as: ADVIL  Take 1 tablet (400 mg total) by mouth 2 (two) times daily for 5 days. Start taking on: March 11, 2024   labetalol  300 MG tablet Commonly known as: NORMODYNE  Take 300 mg by mouth 3 (three) times daily.   losartan  100 MG tablet Commonly known as: COZAAR  Take 100 mg by mouth daily.   methocarbamol 500 MG tablet Commonly known as: ROBAXIN Take 500 mg by mouth 2 (two) times daily as needed for muscle spasms.   NIFEdipine 60 MG 24 hr tablet Commonly known as: ADALAT CC Take 120 mg by mouth  daily.   pantoprazole 40 MG tablet Commonly known as: PROTONIX Take 1 tablet (40 mg total) by mouth daily. Start taking on: March 11, 2024   sevelamer carbonate 2.4 g Pack Commonly known as: RENVELA Take 2.4 g by mouth 3 (three) times daily.   traMADol 50 MG tablet Commonly known as: ULTRAM Take 50 mg by mouth every 6 (six) hours as needed for moderate pain (pain score 4-6).   vitamin B-12 500 MCG tablet Commonly known as: CYANOCOBALAMIN Take 1 tablet (500 mcg total) by mouth daily.       Allergies  Allergen Reactions   Clonidine      Other Reaction(s): skin burns   Ace Inhibitors Cough and Other (See Comments)    Other Reaction(s): Cough  Other  Reaction(s): cough  Couldn't stop coughing   Nsaids Other (See Comments)    Other Reaction(s): Serum creatinine above reference range   Ketorolac  Tromethamine      Other Reaction(s): Serum creatinine above reference range   Lisinopril Cough   Other Other (See Comments)    G6 PD pill allergy : pill given before travel in [employer] (reaction?)    Follow-up Information     [facility] Schedule an appointment as soon as possible for a visit in 2 week(s).   Contact information: [address] [facility] [place] [address] [phone]         HD Follow up on 03/11/2024.   Why: Follow-up as scheduled for regular HD session on 03/11/2024                 The results of significant diagnostics from this hospitalization (including imaging, microbiology, ancillary and laboratory) are listed below for reference.    Significant Diagnostic Studies: ECHOCARDIOGRAM COMPLETE Result Date: 03/10/2024    ECHOCARDIOGRAM REPORT   Patient Name:   Dylan Sweeney Date of Exam: 03/10/2024 Medical Rec #:  [id number]       Height:       71.0 in Accession #:    [id number]      Weight:       160.0 lb Date of Birth:  1970/11/26        BSA:          1.918 m Patient Age:    53 years        BP:           168/88 mmHg  Patient Gender: M               HR:           101 bpm. Exam Location:  Inpatient Procedure: 2D Echo, Cardiac Doppler, Color Doppler and Strain Analysis (Both            Spectral and Color Flow Doppler were utilized during procedure). Indications:    Pericardial effusion  History:        Patient has prior history of Echocardiogram examinations, most                 recent 09/21/2018. Signs/Symptoms:Chest Pain and Shortness of                 Breath; Risk Factors:Hypertension and Sleep Apnea. CKD.  Sonographer:    Philomena Daring Referring Phys: [id number] Miho Monda V Dushawn Pusey  Sonographer Comments: Global longitudinal strain was attempted. IMPRESSIONS  1. Left ventricular ejection fraction, by estimation, is 60 to 65%. The left ventricle has normal function. The left ventricle has no regional wall motion abnormalities. Left ventricular diastolic parameters were normal. The average left ventricular global longitudinal strain is -16.5 %. The global longitudinal strain is abnormal.  2. Right ventricular systolic function is normal. The right ventricular size is normal. There is normal pulmonary artery systolic pressure. The estimated right ventricular systolic pressure is 29.6 mmHg.  3. A small pericardial effusion is present. The pericardial effusion is posterior to the left ventricle. There is no evidence of cardiac tamponade.  4. The mitral valve is normal in structure. Mild mitral valve regurgitation. No evidence of mitral stenosis.  5. The aortic valve is tricuspid. Aortic valve regurgitation is not visualized. Aortic valve sclerosis is present, with no evidence of aortic valve stenosis.  6. The inferior vena cava is normal in size with greater than 50% respiratory variability, suggesting right atrial  pressure of 3 mmHg. Comparison(s): A prior study was performed on 09/21/2018. No significant change from prior study. FINDINGS  Left Ventricle: Left ventricular ejection fraction, by estimation, is 60 to 65%. The left ventricle  has normal function. The left ventricle has no regional wall motion abnormalities. The average left ventricular global longitudinal strain is -16.5 %. Strain was performed and the global longitudinal strain is abnormal. The left ventricular internal cavity size was normal in size. There is borderline left ventricular hypertrophy. Left ventricular diastolic parameters were normal. Right Ventricle: The right ventricular size is normal. No increase in right ventricular wall thickness. Right ventricular systolic function is normal. There is normal pulmonary artery systolic pressure. The tricuspid regurgitant velocity is 2.58 m/s, and  with an assumed right atrial pressure of 3 mmHg, the estimated right ventricular systolic pressure is 29.6 mmHg. Left Atrium: Left atrial size was normal in size. Right Atrium: Right atrial size was normal in size. Pericardium: A small pericardial effusion is present. The pericardial effusion is posterior to the left ventricle. There is no evidence of cardiac tamponade. Mitral Valve: The mitral valve is normal in structure. Mild mitral valve regurgitation. No evidence of mitral valve stenosis. Tricuspid Valve: The tricuspid valve is normal in structure. Tricuspid valve regurgitation is mild . No evidence of tricuspid stenosis. Aortic Valve: The aortic valve is tricuspid. Aortic valve regurgitation is not visualized. Aortic valve sclerosis is present, with no evidence of aortic valve stenosis. Pulmonic Valve: The pulmonic valve was normal in structure. Pulmonic valve regurgitation is not visualized. No evidence of pulmonic stenosis. Aorta: The aortic root and ascending aorta are structurally normal, with no evidence of dilitation. Venous: The inferior vena cava is normal in size with greater than 50% respiratory variability, suggesting right atrial pressure of 3 mmHg. IAS/Shunts: The atrial septum is grossly normal.  LEFT VENTRICLE PLAX 2D LVIDd:         3.80 cm   Diastology LVIDs:          2.60 cm   LV e' medial:    7.18 cm/s LV PW:         1.20 cm   LV E/e' medial:  11.3 LV IVS:        1.20 cm   LV e' lateral:   9.79 cm/s LVOT diam:     2.10 cm   LV E/e' lateral: 8.3 LV SV:         54 LV SV Index:   28        2D Longitudinal Strain LVOT Area:     3.46 cm  2D Strain GLS Avg:     -16.5 % LV IVRT:       63 msec  RIGHT VENTRICLE             IVC RV Basal diam:  3.70 cm     IVC diam: 1.90 cm RV Mid diam:    3.00 cm RV S prime:     10.70 cm/s TAPSE (M-mode): 1.6 cm LEFT ATRIUM             Index        RIGHT ATRIUM           Index LA diam:        3.50 cm 1.83 cm/m   RA Area:     18.00 cm LA Vol (A2C):   61.6 ml 32.12 ml/m  RA Volume:   47.60 ml  24.82 ml/m LA Vol (A4C):   59.7 ml 31.13  ml/m LA Biplane Vol: 60.3 ml 31.44 ml/m  AORTIC VALVE LVOT Vmax:   109.00 cm/s LVOT Vmean:  72.200 cm/s LVOT VTI:    0.156 m  AORTA Ao Root diam: 3.10 cm Ao Asc diam:  3.70 cm MITRAL VALVE               TRICUSPID VALVE MV Area (PHT): 6.54 cm    TR Peak grad:   26.6 mmHg MV Decel Time: 116 msec    TR Vmax:        258.00 cm/s MV E velocity: 81.00 cm/s MV A velocity: 86.40 cm/s  SHUNTS MV E/A ratio:  0.94        Systemic VTI:  0.16 m                            Systemic Diam: 2.10 cm Sunit Tolia Electronically signed by Madonna Large Signature Date/Time: 03/10/2024/2:20:37 PM    Final    CT Angio Chest PE W and/or Wo Contrast Result Date: 03/09/2024 CLINICAL DATA:  Recent diagnosis of renal cell cancer left-sided chest pain short of breath EXAM: CT ANGIOGRAPHY CHEST WITH CONTRAST TECHNIQUE: Multidetector CT imaging of the chest was performed using the standard protocol during bolus administration of intravenous contrast. Multiplanar CT image reconstructions and MIPs were obtained to evaluate the vascular anatomy. RADIATION DOSE REDUCTION: This exam was performed according to the departmental dose-optimization program which includes automated exposure control, adjustment of the mA and/or kV according to patient size and/or  use of iterative reconstruction technique. CONTRAST:  75mL OMNIPAQUE  IOHEXOL  350 MG/ML SOLN COMPARISON:  Chest x-ray 03/09/2024, CT chest 03/12/2015 FINDINGS: Cardiovascular: Satisfactory opacification of the pulmonary arteries to the segmental level. No evidence of pulmonary embolism. Nonaneurysmal aorta. Common origin of right brachiocephalic and left common carotid vessels. Direct origin of left vertebral artery from the distal arch. No dissection. Multi-vessel coronary vascular calcification. Cardiomegaly. Small circumferential pericardial effusion measuring up to 12 mm. Pericardial fluid demonstrates increased density values Mediastinum/Nodes: Patent trachea. No suspicious thyroid mass. Subcentimeter mediastinal lymph nodes. Esophagus within normal limits Lungs/Pleura: Trace left-sided pleural effusion. No focal airspace disease or pneumothorax. Upper Abdomen: No acute findings. Low-density in the upper pole left kidney and lobulated contour of the right kidney, series 5, image 173, further assessment limited without contrast. Musculoskeletal: No acute osseous abnormality Review of the MIP images confirms the above findings. IMPRESSION: 1. Negative for acute pulmonary embolus or aortic dissection. 2. Cardiomegaly with small circumferential pericardial effusion. Pericardial fluid demonstrates increased density values, suggesting possible complex fluid. 3. Trace left-sided pleural effusion. 4. Low-density in the upper pole left kidney and lobulated contour of the right kidney, further assessment limited without contrast. Correlate with the patient's more recent prior imaging given clinical history. Electronically Signed   By: Luke Bun M.D.   On: 03/09/2024 16:12   DG Chest 2 View Result Date: 03/09/2024 EXAM: 2 VIEW(S) XRAY OF THE CHEST 03/09/2024 01:49:00 PM COMPARISON: 12/25/2023 CLINICAL HISTORY: cp. sob FINDINGS: LUNGS AND PLEURA: No focal pulmonary opacity. No pulmonary edema. No pleural effusion. No  pneumothorax. HEART AND MEDIASTINUM: No acute abnormality of the cardiac and mediastinal silhouettes. BONES AND SOFT TISSUES: No acute osseous abnormality. IMPRESSION: 1. No acute cardiopulmonary abnormality. Electronically signed by: Lynwood Seip MD 03/09/2024 02:17 PM EST RP Workstation: [id number]    Microbiology: Recent Results (from the past 240 hours)  Culture, blood (Routine X 2) w Reflex to ID Panel  Status: None (Preliminary result)   Collection Time: 03/09/24  7:11 PM   Specimen: BLOOD  Result Value Ref Range Status   Specimen Description   Final    BLOOD RIGHT ANTECUBITAL Performed at [facility], [address] [place], [place] [address]    Special Requests   Final    BOTTLES DRAWN AEROBIC AND ANAEROBIC Blood Culture results may not be optimal due to an inadequate volume of blood received in culture bottles Performed at [facility], [address], [place], [place] [address]    Culture   Final    NO GROWTH < 12 HOURS Performed at [facility], [address], [place], [place] [address]    Report Status PENDING  Incomplete  Culture, blood (Routine X 2) w Reflex to ID Panel     Status: None (Preliminary result)   Collection Time: 03/09/24  7:13 PM   Specimen: BLOOD  Result Value Ref Range Status   Specimen Description   Final    BLOOD BLOOD RIGHT FOREARM Performed at [facility], [address], [place], [place] [address]    Special Requests   Final    BOTTLES DRAWN AEROBIC AND ANAEROBIC Blood Culture adequate volume Performed at [facility], [address], [place], [place] [address]    Culture   Final    NO GROWTH < 12 HOURS Performed at [facility], [address], [place], [place] [address]    Report Status PENDING  Incomplete     Labs: Basic Metabolic Panel: Recent Labs  Lab 03/09/24 1351 03/10/24 1248  NA 134* 131*  131*  K 3.5 3.9  3.9  CL 91* 89*  89*   CO2 33* 30  30  GLUCOSE 164* 89  91  BUN 11 21*  21*  CREATININE 4.25* 6.70*  6.71*  CALCIUM  8.8* 8.4*  8.4*  PHOS  --  4.7*  4.7*   Liver Function Tests: Recent Labs  Lab 03/09/24 1351 03/10/24 1248  AST 14* 13*  ALT <5 <5  ALKPHOS 71 61  BILITOT 0.4 0.3  PROT 8.1 7.3  ALBUMIN  3.6 3.3*  3.3*   No results for input(s): LIPASE, AMYLASE in the last 168 hours. No results for input(s): AMMONIA in the last 168 hours. CBC: Recent Labs  Lab 03/09/24 1351 03/10/24 1248  WBC 14.6* 13.0*  NEUTROABS  --  10.8*  HGB 7.2* 7.7*  HCT 22.7* 23.9*  MCV 88.7 88.8  PLT 405* 354   Cardiac Enzymes: No results for input(s): CKTOTAL, CKMB, CKMBINDEX, TROPONINI in the last 168 hours. BNP: BNP (last 3 results) No results for input(s): BNP in the last 8760 hours.  ProBNP (last 3 results) Recent Labs    03/09/24 1904  PROBNP 6,186.0*    CBG: No results for input(s): GLUCAP in the last 168 hours.     Signed:  Toribio Hummer MD.  [facility] 03/10/2024, 6:37 PM

## 2024-03-10 NOTE — Care Management CC44 (Signed)
 Condition Code 44 Documentation Completed  Patient Details  Name: Dylan Sweeney MRN: [id number] Date of Birth: [birth year]   Condition Code 44 given:    Patient signature on Condition Code 44 notice:    Documentation of 2 MD's agreement:    Code 44 added to claim:       Orlando Thalmann G., RN 03/10/2024, 6:32 PM

## 2024-03-10 NOTE — Care Management Obs Status (Signed)
 MEDICARE OBSERVATION STATUS NOTIFICATION   Patient Details  Name: Dylan Sweeney MRN: [id number] Date of Birth: May 08, 1970   Medicare Observation Status Notification Given:  Yes    Cayton Cuevas G., RN 03/10/2024, 6:22 PM

## 2024-03-10 NOTE — Consult Note (Signed)
 [facility] Renal Consultation Note  Requesting MD: Toribio Hummer, MD Indication for Consultation:  ESRD  Chief complaint: chest pain   HPI:  Dylan Sweeney is a 53 y.o. male with a history of end-stage renal disease on HD (TTS at the [facility]), HTN, and recent diagnosis of kidney mass who presented to the ER for evaluation of chest pain.  Chest pain first happened three weeks ago when he was lifting weights at the gym.  He thinks he pulled a muscle.  The patient states that he has tried a muscle relaxer and tramadol for his chest discomfort and they haven't helped.  He states that he can walk up a flight of stairs without an issue.  He has been seen by cardiology who assessed that he was ok to discharge home from a cardiology perspective if his echo was ok.  He is still waiting on a bed at [facility].  He denies any shortness of breath and no n/v.  He thinks that they discontinued EPO because his hemoglobin levels were too high.  Per outpatient dialysis unit, Epogen  was stopped on 9/2.  Hb was 10.5 on 10/9 and then 7.3 on 11/6.  He denies any blood in his stool or dark BM's.  Unfortunately, last week he was just diagnosed with a kidney mass and is scheduled for nephrectomy with [facility] on 05/24/2024 per charting.    He would like to go and dialyze at his outpatient clinic tomorrow.     PMHx:   Past Medical History:  Diagnosis Date   Asthma    Depression    PTSD   Enlarged heart    History of kidney stones    in kidneys - no problems per patient 02/02/20   Hypertension    Peripheral vascular disease    Renal disorder    stage 4   Renal insufficiency    Sickle cell anemia (HCC)    Has the trait-per patient (03/12/15)   Sickle cell trait    Sleep apnea    Per pt on 02/02/20 - uses 2-3 x week as needed when patient can not sleep    Past Surgical History:  Procedure Laterality Date   AV FISTULA PLACEMENT Left 02/05/2020   Procedure: ARTERIOVENOUS (AV) FISTULA CREATION  LEFT;  Surgeon: Gretta Lonni PARAS, MD;  Location: [facility] OR;  Service: Vascular;  Laterality: Left;   NO PAST SURGERIES      Family Hx:  Family History  Problem Relation Age of Onset   Heart disease Mother    Hypertension Sister    Asthma Sister    Hypertension Brother     Social History:  reports that he has quit smoking. His smoking use included cigarettes. He has never used smokeless tobacco. He reports current alcohol use of about 6.0 standard drinks of alcohol per week. He reports current drug use. Drug: Marijuana.  Allergies:  Allergies  Allergen Reactions   Clonidine      Other Reaction(s): skin burns   Ace Inhibitors Cough and Other (See Comments)    Other Reaction(s): Cough  Other Reaction(s): cough  Couldn't stop coughing   Nsaids Other (See Comments)    Other Reaction(s): Serum creatinine above reference range   Ketorolac  Tromethamine      Other Reaction(s): Serum creatinine above reference range   Lisinopril Cough   Other Other (See Comments)    G6 PD pill allergy : pill given before travel in [employer] (reaction?)    Medications: Prior to Admission medications  Medication Sig Start Date End Date Taking? Authorizing Provider  albuterol  (PROVENTIL  HFA;VENTOLIN  HFA) 108 (90 BASE) MCG/ACT inhaler Inhale 2 puffs into the lungs every 6 (six) hours as needed for wheezing or shortness of breath.    Yes [provider]  busPIRone (BUSPAR) 10 MG tablet Take 5 mg by mouth 2 (two) times daily. 01/27/24  Yes [provider]  cinacalcet (SENSIPAR) 30 MG tablet Take 30 mg by mouth See admin instructions. Tuesday, Thursday, and Saturday's. Along with 60mg  for total dose of 90mg .   Yes [provider]  cinacalcet (SENSIPAR) 60 MG tablet Take 60 mg by mouth daily.   Yes [provider]  hydrALAZINE  (APRESOLINE ) 100 MG tablet Take 1 tablet (100 mg total) by mouth every 8 (eight) hours. 03/19/15  Yes Abrol, Nayana, MD  hydrOXYzine  (ATARAX ) 25 MG  tablet Take 25-50 mg by mouth 3 (three) times daily as needed for anxiety. 01/27/24  Yes [provider]  labetalol  (NORMODYNE ) 300 MG tablet Take 300 mg by mouth 3 (three) times daily.   Yes [provider]  losartan  (COZAAR ) 100 MG tablet Take 100 mg by mouth daily.   Yes [provider]  methocarbamol (ROBAXIN) 500 MG tablet Take 500 mg by mouth 2 (two) times daily as needed for muscle spasms. 02/08/24  Yes [provider]  NIFEdipine (ADALAT CC) 60 MG 24 hr tablet Take 120 mg by mouth daily. 08/24/23  Yes [provider]  sevelamer carbonate (RENVELA) 2.4 g PACK Take 2.4 g by mouth 3 (three) times daily. 12/14/23  Yes [provider]  traMADol (ULTRAM) 50 MG tablet Take 50 mg by mouth every 6 (six) hours as needed for moderate pain (pain score 4-6). 02/22/24  Yes [provider]   I have reviewed the patient's current and reported prior to admission medications.   Labs:     Latest Ref Rng & Units 03/10/2024   12:48 PM 03/09/2024    1:51 PM 12/25/2023   10:58 PM  BMP  Glucose 70 - 99 mg/dL 70 - 99 mg/dL 89    91  835  812   BUN 6 - 20 mg/dL 6 - 20 mg/dL 21    21  11  26    Creatinine 0.61 - 1.24 mg/dL 9.38 - 8.75 mg/dL 3.29    3.28  5.74  2.53   Sodium 135 - 145 mmol/L 135 - 145 mmol/L 131    131  134  131   Potassium 3.5 - 5.1 mmol/L 3.5 - 5.1 mmol/L 3.9    3.9  3.5  4.1   Chloride 98 - 111 mmol/L 98 - 111 mmol/L 89    89  91  86   CO2 22 - 32 mmol/L 22 - 32 mmol/L 30    30  33  25   Calcium  8.9 - 10.3 mg/dL 8.9 - 89.6 mg/dL 8.4    8.4  8.8  9.8      ROS:  Pertinent items noted in HPI and remainder of comprehensive ROS otherwise negative.  Physical Exam: Vitals:   03/10/24 1106 03/10/24 1553  BP: (!) 178/101 (!) 179/90  Pulse: (!) 104 (!) 105  Resp: 18 20  Temp: 98.8 F (37.1 C) 98.5 F (36.9 C)  SpO2: 96% 93%     General: Adult male in stretcher in no acute distress at rest HEENT: Normocephalic  atraumatic Eyes: EXTR ocular movements intact sclera anicteric Neck: Supple trachea midline Heart: S1-S2 no rub Lungs: Clear  to auscultation bilaterally normal work of breathing at rest Abdomen: Soft nontender nondistended Extremities: No edema appreciated no cyanosis or clubbing Skin: No rash on extremities exposed Psych normal mood and affect Neuro: Alert and oriented x 3 provides a history follows commands Access left upper extremity AV fistula with bruit and thrill, aneurysmal  Assessment/Plan:  # Chest pain  - Cardiology has assessed him as acceptable for discharge from a cardiology standpoint if his echo is alright  - He points to an injury at the gym as when his chest pain started  - reasonable to do a short course (i.e. 5 days) of NSAID's with GI ppx    # ESRD  - HD per TTS schedule  - acceptable for HD tomorrow - he is requesting to be discharged which is reasonable.  If he is discharged after blood, he is able to go to his outpatient HD unit tomorrow at the [facility] - Discussed to limit fluid intake  # Anemia of CKD - Patient has been off of his ESA since 01/04/24 - he denies dark stools or blood per rectum  - s/p PRBC's x 1 unit - Would give additional 1 unit - discussed with primary team  - Would assess restarting ESA outpatient   Acceptable for discharge from a renal standpoint.  Can do HD [facility] 03/10/2024, 5:00 PM

## 2024-03-10 NOTE — Consult Note (Addendum)
 Cardiology Consultation  Patient ID: Dylan Sweeney MRN: [id number]; DOB: March 25, 1971  Admit date: 03/09/2024 Date of Consult: 03/10/2024  PCP:  Clinic, [facility] Providers Cardiologist:  None    Patient Profile: Dylan Sweeney is a 53 y.o. male with a hx of ESRD on HD (started April 2024, Tue/Thur/Sat), hypertension, hyperlipidemia, peripheral vascular disease, recent diagnosis of renal cell carcinoma, OSA not on CPAP, sickle cell trait, G6PD deficiency, history of CVA who is being seen 03/10/2024 for the evaluation of atypical chest pain at the request of Dr. Sebastian.  History of Present Illness: Mr. Dylan Sweeney has past medical history as listed above.  It appears that he gets most of his primary care through the [facility].  He had recently presented to [facility] emergency department on 02/22/2024 for chest pain, pulled muscle.  He reported that he had strained some muscles in his chest the week prior when he was lifting weights.  He had previously gone to the [place], they drew routine labs and a EKG which was normal.  He reports that he was given a muscle relaxer however this did not relieve his pain.  He was given IM tramadol while he was in the hospital and discharged with a prescription for oral tramadol 50 mg every 6 hours as needed for up to 5 days.  He had denied any further workup including labs/EKG at this emergency department as he had just recently had them done.  He then presents to [facility] emergency department on 03/09/2024 complaining of similar chest pain that has been going on for about 3 weeks.  Relevant workup since in the ED includes: Troponin 36 ? 38, proBNP elevated at 6186, metabolic panel consistent with end-stage renal disease, CBC shows anemia with hemoglobin 7.2, hematocrit 22.7, folate < 3, iron, TIBC both decreased, ferritin elevated at 1100.  CTPA was negative for PE/dissection, it did show cardiomegaly with a small circumferential pericardial  effusion suggesting possible complex fluid, trace left-sided pleural effusion.  It appears that he was undergoing workup for possible kidney transplant in September 2025.  This workup included various imaging.  He had an echocardiogram which showed LVEF 50 to 55%, mildly dilated left atrium, mild TR, normal IVC, no pericardial effusion.  He also underwent a stress echo which showed normal LV function and global wall motion with stress, no wall motion abnormalities postexercise, normal increase in global LV function postexercise noted to be greater than 70% with stress.  After speaking with the patient, he tells me that he has been having this same dull constant pain for about 3 weeks now. He does note that his pain is worse with deep inspiration but denies any other aggravating or alleviating factors. He tells me that he currently is feeling rather well. He denies any recent exertional component to his chest pain.  Past Medical History:  Diagnosis Date   Asthma    Depression    PTSD   Enlarged heart    History of kidney stones    in kidneys - no problems per patient 02/02/20   Hypertension    Peripheral vascular disease    Renal disorder    stage 4   Renal insufficiency    Sickle cell anemia (HCC)    Has the trait-per patient (03/12/15)   Sickle cell trait    Sleep apnea    Per pt on 02/02/20 - uses 2-3 x week as needed when patient can not sleep   Past Surgical  History:  Procedure Laterality Date   AV FISTULA PLACEMENT Left 02/05/2020   Procedure: ARTERIOVENOUS (AV) FISTULA CREATION LEFT;  Surgeon: Dylan Lonni PARAS, MD;  Location: [facility] OR;  Service: Vascular;  Laterality: Left;   NO PAST SURGERIES      Home Medications:  Prior to Admission medications   Medication Sig Start Date End Date Taking? Authorizing Provider  methocarbamol (ROBAXIN) 500 MG tablet Take 500 mg by mouth 2 (two) times daily as needed for muscle spasms. 02/08/24  Yes [provider]  sevelamer carbonate  (RENVELA) 2.4 g PACK Take 2.4 g by mouth 3 (three) times daily. 12/14/23  Yes [provider]  albuterol  (PROVENTIL  HFA;VENTOLIN  HFA) 108 (90 BASE) MCG/ACT inhaler Inhale 2 puffs into the lungs every 6 (six) hours as needed for wheezing or shortness of breath.     [provider]  amLODipine  (NORVASC ) 10 MG tablet Take 1 tablet (10 mg total) by mouth daily. 03/19/15   Sweeney, Nayana, MD  busPIRone (BUSPAR) 10 MG tablet Take 5 mg by mouth 2 (two) times daily. 01/27/24   [provider]  calcitRIOL  (ROCALTROL ) 0.25 MCG capsule Take 0.25 mcg by mouth daily.    [provider]  finasteride (PROSCAR) 5 MG tablet Take 5 mg by mouth daily.    [provider]  folic acid  (FOLVITE ) 1 MG tablet Take 1 tablet (1 mg total) by mouth daily. 03/19/15   Sweeney, Nayana, MD  folic acid -vitamin b complex-vitamin c-selenium-zinc (DIALYVITE) 3 MG TABS tablet Take 1 tablet by mouth daily.    [provider]  hydrALAZINE  (APRESOLINE ) 100 MG tablet Take 1 tablet (100 mg total) by mouth every 8 (eight) hours. Patient not taking: Reported on 10/19/2021 03/19/15   Sweeney, Nayana, MD  HYDROcodone -acetaminophen  (NORCO/VICODIN) 5-325 MG tablet Take 1 tablet by mouth every 6 (six) hours as needed for moderate pain or severe pain. 10/23/21   Dylan Sweeney LABOR, MD  HYDROcodone -acetaminophen  (NORCO/VICODIN) 5-325 MG tablet Take 1 tablet by mouth every 4 (four) hours as needed. 11/01/21   Sweeney, Julie, MD  hydrOXYzine  (ATARAX ) 25 MG tablet Take 25-50 mg by mouth 3 (three) times daily as needed for anxiety. 01/27/24   [provider]  labetalol  (NORMODYNE ) 300 MG tablet Take 300 mg by mouth 3 (three) times daily.    [provider]  tamsulosin  (FLOMAX ) 0.4 MG CAPS capsule Take 2 capsules (0.8 mg total) by mouth daily after supper. 10/23/21   Dylan Sweeney LABOR, MD  traMADol (ULTRAM) 50 MG tablet Take 50 mg by mouth every 6 (six) hours as needed for moderate pain (pain score  4-6). 02/22/24   [provider]  vitamin B-12 (CYANOCOBALAMIN) 500 MCG tablet Take 500 mcg by mouth daily.    [provider]   Scheduled Meds:  amLODipine   10 mg Oral Daily   calcitRIOL   0.25 mcg Oral Daily   finasteride  5 mg Oral Daily   folic acid   1 mg Oral Daily   labetalol   300 mg Oral TID   sodium chloride  flush  3 mL Intravenous Q12H   tamsulosin   0.8 mg Oral QPC supper   vitamin B-12  500 mcg Oral Daily   Continuous Infusions:  PRN Meds: albuterol , hydrALAZINE , HYDROmorphone  (DILAUDID ) injection, ondansetron  **OR** ondansetron  (ZOFRAN ) IV, oxyCODONE , senna-docusate, sorbitol  Allergies:    Allergies  Allergen Reactions   Clonidine      Other Reaction(s): skin burns   Ace Inhibitors Cough and Other (See Comments)    Other Reaction(s):  Cough  Other Reaction(s): cough  Couldn't stop coughing   Nsaids Other (See Comments)    Other Reaction(s): Serum creatinine above reference range   Ketorolac  Tromethamine      Other Reaction(s): Serum creatinine above reference range   Lisinopril Cough   Other Other (See Comments)    G6 PD pill allergy : pill given before travel in [employer] (reaction?)   Social History:   Social History   Socioeconomic History   Marital status: Divorced    Spouse name: Not on file   Number of children: Not on file   Years of education: Not on file   Highest education level: Not on file  Occupational History   Not on file  Tobacco Use   Smoking status: Former    Types: Cigarettes   Smokeless tobacco: Never   Tobacco comments:    one pack would last a month-08/27/17  Vaping Use   Vaping status: Never Used  Substance and Sexual Activity   Alcohol use: Yes    Alcohol/week: 6.0 standard drinks of alcohol    Types: 6 Cans of beer per week    Comment: 6 pack/week   Drug use: Yes    Types: Marijuana    Comment: Last use marijuana 01/27/20   Sexual activity: Not on file  Other Topics Concern   Not on file  Social History  Narrative   Not on file   Social Drivers of Health   Financial Resource Strain: Not on file  Food Insecurity: Low Risk  (07/07/2023)   Received from [facility]   Hunger Vital Sign    Within the past 12 months, you worried that your food would run out before you got money to buy more: Never true    Within the past 12 months, the food you bought just didn't last and you didn't have money to get more. : Never true  Transportation Needs: No Transportation Needs (07/07/2023)   Received from [facility]    In the past 12 months, has lack of reliable transportation kept you from medical appointments, meetings, work or from getting things needed for daily living? : No  Physical Activity: Not on file  Stress: Not on file  Social Connections: Not on file  Intimate Partner Violence: Not At Risk (02/22/2024)   Received from [facility]   HITS    Over the last 12 months how often did your partner physically hurt you?: Never    Over the last 12 months how often did your partner insult you or talk down to you?: Never    Over the last 12 months how often did your partner threaten you with physical harm?: Never    Over the last 12 months how often did your partner scream or curse at you?: Never    Family History:   Family History  Problem Relation Age of Onset   Heart disease Mother    Hypertension Sister    Asthma Sister    Hypertension Brother     ROS:  Please see the history of present illness.  All other ROS reviewed and negative.     Physical Exam/Data: Vitals:   03/10/24 0615 03/10/24 0630 03/10/24 0700 03/10/24 0715  BP: (!) 157/85 (!) 164/87 (!) 162/86 (!) 166/93  Pulse: 100 100 (!) 102 99  Resp:    18  Temp:    98.8 F (37.1 C)  TempSrc:    Oral  SpO2: 92% 91% 93% 93%    Intake/Output Summary (  Last 24 hours) at 03/10/2024 0950 Last data filed at 03/10/2024 0531 Gross per 24 hour  Intake 555 ml  Output --  Net 555 ml      12/25/2023   10:49 PM  11/23/2023    6:17 PM 03/12/2022    8:58 AM  Last 3 Weights  Weight (lbs) 160 lb 169 lb 15.6 oz 170 lb  Weight (kg) 72.576 kg 77.1 kg 77.111 kg     There is no height or weight on file to calculate BMI.   General:  in no acute distress, on room air HEENT: normal Neck: no JVD Vascular: Distal pulses 2+ bilaterally Cardiac:  RRR; + murmur Lungs:  no increased respiratory effort, decreased at bases  Abd: soft, nontender, no hepatomegaly  Ext: no edema Musculoskeletal:  No deformities Skin: warm and dry  Neuro:  no focal abnormalities noted Psych:  Normal affect   EKG:  The EKG was personally reviewed and demonstrates:  sinus tachycardia, HR 121, LVH, no acute changes when compared to prior   Telemetry:  Telemetry was personally reviewed and demonstrates:  sinus rhythm, HR 90-100s  Relevant CV Studies:  Stress echo, 01/31/2024, [facility]  Normal left ventricular function and global wall motion with stress.  There were no segmental wall motion abnormalities post exercise. There was normal increase in global LV function post exercise. The estimated LV ejection fraction is >70% with stress   Echocardiogram, 01/31/2024, from [facility] The left ventricular size is normal with mild concentric hypertrophy.  Left ventricular systolic function is low normal.  LV ejection fraction = 50-55%.  Left ventricular filling pattern is prolonged relaxation.  The right ventricle is normal in size and function.  The left atrium is mildly dilated.  There is mild tricuspid regurgitation.  Estimated right ventricular systolic pressure is 22 mmHg.  The IVC is normal in size with an inspiratory collapse of greater than 50%, suggesting normal right atrial pressure.  There is no pericardial effusion.   Laboratory Data: High Sensitivity Troponin:  No results for input(s): TROPONINIHS in the last 720 hours.   Chemistry Recent Labs  Lab 03/09/24 1351  NA 134*  K 3.5  CL 91*  CO2  33*  GLUCOSE 164*  BUN 11  CREATININE 4.25*  CALCIUM  8.8*  GFRNONAA 16*  ANIONGAP 10    Recent Labs  Lab 03/09/24 1351  PROT 8.1  ALBUMIN  3.6  AST 14*  ALT <5  ALKPHOS 71  BILITOT 0.4   Lipids No results for input(s): CHOL, TRIG, HDL, LABVLDL, LDLCALC, CHOLHDL in the last 168 hours.  Hematology Recent Labs  Lab 03/09/24 1351 03/09/24 1904  WBC 14.6*  --   RBC 2.56* 2.37*  HGB 7.2*  --   HCT 22.7*  --   MCV 88.7  --   MCH 28.1  --   MCHC 31.7  --   RDW 14.5  --   PLT 405*  --    Thyroid No results for input(s): TSH, FREET4 in the last 168 hours.  BNP Recent Labs  Lab 03/09/24 1904  PROBNP 6,186.0*    DDimer No results for input(s): DDIMER in the last 168 hours.  Radiology/Studies:  CT Angio Chest PE W and/or Wo Contrast Result Date: 03/09/2024 CLINICAL DATA:  Recent diagnosis of renal cell cancer left-sided chest pain short of breath EXAM: CT ANGIOGRAPHY CHEST WITH CONTRAST TECHNIQUE: Multidetector CT imaging of the chest was performed using the standard protocol during bolus administration of intravenous contrast. Multiplanar CT  image reconstructions and MIPs were obtained to evaluate the vascular anatomy. RADIATION DOSE REDUCTION: This exam was performed according to the departmental dose-optimization program which includes automated exposure control, adjustment of the mA and/or kV according to patient size and/or use of iterative reconstruction technique. CONTRAST:  75mL OMNIPAQUE  IOHEXOL  350 MG/ML SOLN COMPARISON:  Chest x-ray 03/09/2024, CT chest 03/12/2015 FINDINGS: Cardiovascular: Satisfactory opacification of the pulmonary arteries to the segmental level. No evidence of pulmonary embolism. Nonaneurysmal aorta. Common origin of right brachiocephalic and left common carotid vessels. Direct origin of left vertebral artery from the distal arch. No dissection. Multi-vessel coronary vascular calcification. Cardiomegaly. Small circumferential pericardial  effusion measuring up to 12 mm. Pericardial fluid demonstrates increased density values Mediastinum/Nodes: Patent trachea. No suspicious thyroid mass. Subcentimeter mediastinal lymph nodes. Esophagus within normal limits Lungs/Pleura: Trace left-sided pleural effusion. No focal airspace disease or pneumothorax. Upper Abdomen: No acute findings. Low-density in the upper pole left kidney and lobulated contour of the right kidney, series 5, image 173, further assessment limited without contrast. Musculoskeletal: No acute osseous abnormality Review of the MIP images confirms the above findings. IMPRESSION: 1. Negative for acute pulmonary embolus or aortic dissection. 2. Cardiomegaly with small circumferential pericardial effusion. Pericardial fluid demonstrates increased density values, suggesting possible complex fluid. 3. Trace left-sided pleural effusion. 4. Low-density in the upper pole left kidney and lobulated contour of the right kidney, further assessment limited without contrast. Correlate with the patient's more recent prior imaging given clinical history. Electronically Signed   By: Luke Bun M.D.   On: 03/09/2024 16:12   DG Chest 2 View Result Date: 03/09/2024 EXAM: 2 VIEW(S) XRAY OF THE CHEST 03/09/2024 01:49:00 PM COMPARISON: 12/25/2023 CLINICAL HISTORY: cp. sob FINDINGS: LUNGS AND PLEURA: No focal pulmonary opacity. No pulmonary edema. No pleural effusion. No pneumothorax. HEART AND MEDIASTINUM: No acute abnormality of the cardiac and mediastinal silhouettes. BONES AND SOFT TISSUES: No acute osseous abnormality. IMPRESSION: 1. No acute cardiopulmonary abnormality. Electronically signed by: Lynwood Seip MD 03/09/2024 02:17 PM EST RP Workstation: [id number]   Assessment and Plan:  Atypical chest pain Pericardial effusion  [facility] the recent past regarding this chest pain, that has been ongoing for 3 weeks Previously treated with muscle relaxers, this did  not help much Describes the chest pain as constant, worse with deep inspiration CTA negative for PE, did show small circumferential pericardial effusion CT notes that it appears to be complex fluid, concerning for malignancy with recent RCC workup Troponin 36 ? 38 CRP elevated at 7.3, ESR elevated at 85 Recent echocardiogram from [place] showed LVEF 50 to 55%, no significant abnormalities Recent stress echo from the [place] was normal EKG showed sinus tachycardia, LVH, no significant changes when compared to August 2025 Overall patient is resting fairly comfortably Euvolemic on exam  With recent normal stress echo in Sept. 2025, mildly elevated/flat troponin and atypical chest pain features do not think this warrants any further ischemic evaluation, will discuss with MD Pending updated echocardiogram per primary to further evalate effusion   Per primary Hypertension Anemia Leukocytosis ESRD on HD (Tue/Thur/Sat) Renal mass, possible RCC OSA on CPAP BPH  Risk Assessment/Risk Scores:       For questions or updates, please contact [facility] Please consult [email or web address] for contact info under    Signed, Waddell DELENA Donath, PA-C  03/10/2024 9:50 AM

## 2024-03-13 LAB — BPAM RBC
Blood Product Expiration Date: 202512082359
Blood Product Expiration Date: 202512122359
ISSUE DATE / TIME: 202511062018
ISSUE DATE / TIME: 202511071541
Unit Type and Rh: 5100
Unit Type and Rh: 5100

## 2024-03-13 LAB — TYPE AND SCREEN
ABO/RH(D): O POS
Antibody Screen: NEGATIVE
Unit division: 0
Unit division: 0

## 2024-03-14 LAB — CULTURE, BLOOD (ROUTINE X 2)
Culture: NO GROWTH
Culture: NO GROWTH
Special Requests: ADEQUATE

## 2024-04-21 NOTE — Progress Notes (Addendum)
 New Patient Encounter:   Patient Name: Dylan Sweeney Date of Birth: 02-04-1971 MRN: [id number] Today's Date: 04/21/2024  Referring MD:  Leonce Onita Riley, MD Primary MD: Pcp Not In [place]   Assessment, Plan and Recommendations:    Pericardial effusion, small detected on echocardiogram August 2025.  Will check limited echocardiogram. Chest pain, atypical-resolved with negative prior cardiac evaluation including stress echocardiogram September 2025. 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  Blood pressure being followed by nephrology.  Blood pressure elevated in the clinic today.  Patient states he has not taken his medications. Hyperlipidemia on atorvastatin.  Followed at the [facility]. Tobacco use.  Patient counseled From a cardiac standpoint and given his/her underlying cardiovascular disease, patient is encouraged to receive annual flu and COVID vaccines in addition to being up-to-date on his other vaccinations (pneumococcal, RSV and zoster). Will follow-up post echocardiogram if there are concerning findings.  Otherwise follow-up as needed    Chief Complaint: Pericardial effusion  History of Present Illness: Dylan Sweeney is a 53 y.o. old male here to follow-up on the pericardial effusion that was picked up on an echocardiogram back in the summer.  History is limited.  He has had off-and-on chest pains for some time.  He states that it was constant however it has resolved for the past month or 2.  He has been to the emergency room couple of times for that (see details below) with negative workup.  He follows with the [facility].  No comprehensive records available to review.  He went to the emergency room August 2025 with abdominal and chest burning.  Workup was negative.  He also reported vomiting at that time.  He subsequently underwent a stress echocardiogram September 2025 which reported normal stress EKG with adequate/good  exercise tolerance and negative stress echo for ischemia.  He also went to the emergency room November 2025 after missing dialysis and found to have hyperkalemia.  At that time he also reported chest pain.  There is mention of a new renal cell carcinoma diagnosis at that time.  His cardiac workup was negative.  Past medical history includes end-stage kidney disease, depression, PTSD, CVA hypertension, PVD, sickle cell trait/anemia, OSA, THC use and ? RAS, anemia requiring transfusion    Patient counseled regarding importance/benefit of tobacco cessation both short-term and long-term (>5 min).    Previous Medical History: Medical History[1]  Social hx: Smoker: yes ETOH:no Employment: Retired hotel manager  Family Hx:  1st degree relative with CAD:  Male <55 Or Male: < 65 no    Surgical History[2]  Allergies[3]   Medications:  Current Medications[4]  REVIEW of Systems:  General: Denies any significant weight gain or weight loss in the past year. HEENT: no visual disturbances or hearing loss.  GU: No urinary frequency or hesitancy.  Endocrinology: no excessive thirst,  Musculoskeletal: No kyphosis or scoliosis. No bone deformities.  Integumentary: no skin rashes or lesions noted.  Hematology: no bleeding disorders.  Respiratory: No history of TB, Pneumonia, asthma, or bronchitis.  GI: no history of hiatal hernia or PUD. No nausea, vomiting, diarrhea, or constipation. Cardiac: as above. Psychiatry: No psychosis, no depression, no anxiety Neurology: No headaches, no seizures, no dizziness   ECG: EKG from March 24, 2024 reviewed.  Sinus rhythm, poor R wave progression.  Objective: BP (!) 153/100 (BP Location: Left arm, Patient Position: Sitting)   Pulse 87   Wt 73.3 kg (161 lb 8 oz)   SpO2 100%  BMI 22.53 kg/m    Physical Exam:   G: Comfortable, in NAD HENT: AT/[place] Neck: Supple, No JVD, No bruits, no masses or LN Lungs, CTA/T2P, symmetrical air movement, no rales  crackles or wheezes CV: PMI non displaced, RRR, Nl S1/S2, murmur/vascular hum, No H/R/T  Carotids: 2+ symmetrical ,  No bruits,   B/R: 2+ symmetrical  Pedal: Palpable bilaterally     Ext: Preserved range of motion, No edema,  Neuro: A&O x3, No GFMD, appropriate affect       Rony L. Roselind, MD, CODY CHURCHES, [facility], FASE Clinical and Interventional Cardiology Hypertension Specialist    Current and old records, outside records, ECG's,radiology reports, echocardiograms, Labs, procedure notes, cath films(if any) were reviewed.    This note was generated using a voice recognition system. May contain unedited mistakes.        [1] Past Medical History: Diagnosis Date   Cancer    (CMD)    Chronic kidney disease    Hyperlipidemia    Hypertension    Stroke    (CMD)   [2] Past Surgical History: Procedure Laterality Date   AV FISTULA PLACEMENT     Procedure: AV FISTULA PLACEMENT   TRANSURETHRAL RESECTION OF PROSTATE N/A 11/14/2021   Procedure: CYSTOSCOPY W/ TURP;  Surgeon: Lamar Lynwood Sar, MD;  Location: [facility] MAIN OR;  Service: Urology;  Laterality: N/A;  [3] Allergies Allergen Reactions   Lisinopril Other (See Comments)    Couldn't stop coughing  [4]  Current Outpatient Medications:    albuterol  HFA (PROVENTIL  HFA;VENTOLIN  HFA;PROAIR  HFA) 90 mcg/actuation inhaler, Inhale 2 puffs as needed in the morning and 2 puffs as needed at noon and 2 puffs as needed in the evening and 2 puffs as needed before bedtime for wheezing or shortness of breath., Disp: , Rfl:    atorvastatin (LIPITOR) 20 mg tablet, Take 1 tablet (20 mg total) by mouth at bedtime., Disp: 90 tablet, Rfl: 3   B complex 11-folic-C-biot-zinc (Dialyvite) 1-100-300-50 mg-mg-mcg-mg tab, Take 1 tablet by mouth daily., Disp: , Rfl:    calcium  acetate (PHOSLO ) 667 mg (169 mg calcium ) capsule, Take 3 capsules (2,001 mg total) by mouth in the morning and 3 capsules (2,001 mg total) at noon and 3 capsules (2,001  mg total) in the evening. Take with meals., Disp: 270 capsule, Rfl: 0   cinacalcet (SENSIPAR) 30 mg tablet, Take 1 tablet by mouth on Sunday, Monday, Wednesday and Friday, Disp: , Rfl:    cinacalcet (SENSIPAR) 60 mg tablet, , Disp: , Rfl:    hydrALAZINE  (APRESOLINE ) 100 mg tablet, Take 100 mg by mouth 3 (three) times a day., Disp: , Rfl:    labetaloL  (NORMODYNE ) 300 mg tablet, Take 300 mg by mouth 3 (three) times a day., Disp: , Rfl:    lidocaine -prilocaine (EMLA) 2.5-2.5 % cream, Apply 1 Application topically as needed for mild pain (1-3)., Disp: , Rfl:    losartan  (COZAAR ) 100 mg tablet, Take 100 mg by mouth daily., Disp: , Rfl:    mirtazapine (REMERON) 15 mg tablet, Take 7.5 mg by mouth at bedtime., Disp: , Rfl:    NIFEdipine (PROCARDIA XL) 90 mg 24 hr tablet, Take 1 tablet (90 mg total) by mouth daily., Disp: 90 tablet, Rfl: 0   sertraline  (ZOLOFT ) 100 mg tablet, Take 150 mg by mouth daily., Disp: , Rfl:    sildenafiL (REVATIO) 20 mg tablet, Take 2-5 tablets as needed to achieve erection. Do not use more than once per day. Strength: 20 mg, Disp: 20  tablet, Rfl: 2   tamsulosin  (FLOMAX ) 0.4 mg cap, Take 0.8 mg by mouth at bedtime., Disp: , Rfl:

## 2024-05-11 ENCOUNTER — Observation Stay (HOSPITAL_BASED_OUTPATIENT_CLINIC_OR_DEPARTMENT_OTHER)
Admission: EM | Admit: 2024-05-11 | Discharge: 2024-05-12 | Disposition: A | Discharge status: Home / Self Care | Source: Ambulatory Visit | Attending: Emergency Medicine | Admitting: Emergency Medicine

## 2024-05-11 ENCOUNTER — Other Ambulatory Visit: Payer: Self-pay

## 2024-05-11 ENCOUNTER — Encounter (HOSPITAL_BASED_OUTPATIENT_CLINIC_OR_DEPARTMENT_OTHER): Payer: Self-pay | Admitting: Emergency Medicine

## 2024-05-11 DIAGNOSIS — Z87891 Personal history of nicotine dependence: Secondary | ICD-10-CM | POA: Insufficient documentation

## 2024-05-11 DIAGNOSIS — Z85528 Personal history of other malignant neoplasm of kidney: Secondary | ICD-10-CM | POA: Insufficient documentation

## 2024-05-11 DIAGNOSIS — D631 Anemia in chronic kidney disease: Secondary | ICD-10-CM | POA: Insufficient documentation

## 2024-05-11 DIAGNOSIS — N186 End stage renal disease: Secondary | ICD-10-CM | POA: Insufficient documentation

## 2024-05-11 DIAGNOSIS — R799 Abnormal finding of blood chemistry, unspecified: Secondary | ICD-10-CM | POA: Diagnosis present

## 2024-05-11 DIAGNOSIS — Z79899 Other long term (current) drug therapy: Secondary | ICD-10-CM | POA: Diagnosis not present

## 2024-05-11 DIAGNOSIS — I120 Hypertensive chronic kidney disease with stage 5 chronic kidney disease or end stage renal disease: Secondary | ICD-10-CM | POA: Insufficient documentation

## 2024-05-11 DIAGNOSIS — Z992 Dependence on renal dialysis: Secondary | ICD-10-CM | POA: Diagnosis not present

## 2024-05-11 DIAGNOSIS — J45909 Unspecified asthma, uncomplicated: Secondary | ICD-10-CM | POA: Diagnosis not present

## 2024-05-11 DIAGNOSIS — D649 Anemia, unspecified: Principal | ICD-10-CM | POA: Diagnosis present

## 2024-05-11 DIAGNOSIS — I10 Essential (primary) hypertension: Secondary | ICD-10-CM | POA: Diagnosis not present

## 2024-05-11 DIAGNOSIS — Z743 Need for continuous supervision: Secondary | ICD-10-CM | POA: Diagnosis not present

## 2024-05-11 LAB — CBC WITH DIFFERENTIAL/PLATELET
Abs Immature Granulocytes: 0.02 K/uL (ref 0.00–0.07)
Basophils Absolute: 0 K/uL (ref 0.0–0.1)
Basophils Relative: 1 %
Eosinophils Absolute: 0.4 K/uL (ref 0.0–0.5)
Eosinophils Relative: 6 %
HCT: 20.9 % — ABNORMAL LOW (ref 39.0–52.0)
Hemoglobin: 7 g/dL — ABNORMAL LOW (ref 13.0–17.0)
Immature Granulocytes: 0 %
Lymphocytes Relative: 21 %
Lymphs Abs: 1.5 K/uL (ref 0.7–4.0)
MCH: 28.8 pg (ref 26.0–34.0)
MCHC: 33.5 g/dL (ref 30.0–36.0)
MCV: 86 fL (ref 80.0–100.0)
Monocytes Absolute: 0.7 K/uL (ref 0.1–1.0)
Monocytes Relative: 9 %
Neutro Abs: 4.5 K/uL (ref 1.7–7.7)
Neutrophils Relative %: 63 %
Platelets: 208 K/uL (ref 150–400)
RBC: 2.43 MIL/uL — ABNORMAL LOW (ref 4.22–5.81)
RDW: 16.1 % — ABNORMAL HIGH (ref 11.5–15.5)
WBC: 7.1 K/uL (ref 4.0–10.5)
nRBC: 0 % (ref 0.0–0.2)

## 2024-05-11 LAB — BASIC METABOLIC PANEL WITH GFR
Anion gap: 9 (ref 5–15)
BUN: 16 mg/dL (ref 6–20)
CO2: 33 mmol/L — ABNORMAL HIGH (ref 22–32)
Calcium: 8.4 mg/dL — ABNORMAL LOW (ref 8.9–10.3)
Chloride: 96 mmol/L — ABNORMAL LOW (ref 98–111)
Creatinine, Ser: 4.89 mg/dL — ABNORMAL HIGH (ref 0.61–1.24)
GFR, Estimated: 13 mL/min — ABNORMAL LOW
Glucose, Bld: 94 mg/dL (ref 70–99)
Potassium: 3.4 mmol/L — ABNORMAL LOW (ref 3.5–5.1)
Sodium: 138 mmol/L (ref 135–145)

## 2024-05-11 NOTE — ED Triage Notes (Addendum)
 Patient reports having dialysis today and was told he needed a blood transfusion for a hgb of 6.9. Denies symptoms.   Had transfusion last month at [facility].

## 2024-05-11 NOTE — ED Provider Notes (Addendum)
 " [place] EMERGENCY DEPARTMENT AT [facility] Provider Note   CSN: [id number] Arrival date & time: 05/11/24  1427     Patient presents with: Abnormal Lab   Dylan Sweeney is a 54 y.o. male.   Patient 54 year old male who presents with anemia.  He has a history of end-stage renal disease on dialysis, hypertension, sickle cell trait, renal cell mass that is awaiting nephrectomy at [facility].  He was seen in November for anemia with a hemoglobin of 7.2.  Required a blood transfusion.  He also had a small pericardial effusion which was not felt to be significant by cardiology.  He had his normal dialysis this morning which she does on Tuesday Thursday Saturday and they noted him to have a hemoglobin of 6.9.  He does say has been feeling fatigued.  He has a little bit of intermittent shortness of breath.  No chest pain.  No visible blood in his stool or melena.       Prior to Admission medications  Medication Sig Start Date End Date Taking? Authorizing Provider  albuterol  (PROVENTIL  HFA;VENTOLIN  HFA) 108 (90 BASE) MCG/ACT inhaler Inhale 2 puffs into the lungs every 6 (six) hours as needed for wheezing or shortness of breath.     [provider]  amLODipine  (NORVASC ) 10 MG tablet Take 1 tablet (10 mg total) by mouth daily. 03/10/24   Sebastian Toribio GAILS, MD  busPIRone (BUSPAR) 10 MG tablet Take 5 mg by mouth 2 (two) times daily. 01/27/24   [provider]  cinacalcet (SENSIPAR) 30 MG tablet Take 30 mg by mouth See admin instructions. Tuesday, Thursday, and Saturday's. Along with 60mg  for total dose of 90mg .    [provider]  cinacalcet (SENSIPAR) 60 MG tablet Take 60 mg by mouth daily.    [provider]  folic acid  (FOLVITE ) 1 MG tablet Take 1 tablet (1 mg total) by mouth daily. 03/10/24   Sebastian Toribio GAILS, MD  hydrALAZINE  (APRESOLINE ) 100 MG tablet Take 1 tablet (100 mg total) by mouth every 8 (eight) hours. 03/10/24   Sebastian Toribio GAILS, MD   hydrOXYzine  (ATARAX ) 25 MG tablet Take 25-50 mg by mouth 3 (three) times daily as needed for anxiety. 01/27/24   [provider]  labetalol  (NORMODYNE ) 300 MG tablet Take 300 mg by mouth 3 (three) times daily.    [provider]  losartan  (COZAAR ) 100 MG tablet Take 100 mg by mouth daily.    [provider]  methocarbamol (ROBAXIN) 500 MG tablet Take 500 mg by mouth 2 (two) times daily as needed for muscle spasms. 02/08/24   [provider]  NIFEdipine (ADALAT CC) 60 MG 24 hr tablet Take 120 mg by mouth daily. 08/24/23   [provider]  pantoprazole  (PROTONIX ) 40 MG tablet Take 1 tablet (40 mg total) by mouth daily. 03/11/24   Sebastian Toribio GAILS, MD  sevelamer carbonate (RENVELA) 2.4 g PACK Take 2.4 g by mouth 3 (three) times daily. 12/14/23   [provider]  traMADol (ULTRAM) 50 MG tablet Take 50 mg by mouth every 6 (six) hours as needed for moderate pain (pain score 4-6). 02/22/24   [provider]  vitamin B-12 (CYANOCOBALAMIN ) 500 MCG tablet Take 1 tablet (500 mcg total) by mouth daily. 03/10/24   Sebastian Toribio GAILS, MD    Allergies: Clonidine , Ace inhibitors, Nsaids, Ketorolac  tromethamine , Lisinopril, and Other    Review of Systems  Constitutional:  Positive for fatigue. Negative for chills, diaphoresis and  fever.  HENT:  Negative for congestion, rhinorrhea and sneezing.   Eyes: Negative.   Respiratory:  Positive for shortness of breath. Negative for cough and chest tightness.   Cardiovascular:  Negative for chest pain and leg swelling.  Gastrointestinal:  Negative for abdominal pain, blood in stool, diarrhea, nausea and vomiting.  Genitourinary:  Negative for difficulty urinating, flank pain, frequency and hematuria.  Musculoskeletal:  Negative for arthralgias and back pain.  Skin:  Negative for rash.  Neurological:  Negative for dizziness, speech difficulty, weakness, numbness and headaches.    Updated Vital Signs BP  129/80 (BP Location: Right Arm)   Pulse 90   Temp 98.6 F (37 C)   Resp 18   SpO2 98%   Physical Exam Constitutional:      Appearance: He is well-developed.  HENT:     Head: Normocephalic and atraumatic.  Eyes:     Pupils: Pupils are equal, round, and reactive to light.  Cardiovascular:     Rate and Rhythm: Normal rate and regular rhythm.     Heart sounds: Normal heart sounds.  Pulmonary:     Effort: Pulmonary effort is normal. No respiratory distress.     Breath sounds: Normal breath sounds. No wheezing or rales.  Chest:     Chest wall: No tenderness.  Abdominal:     General: Bowel sounds are normal.     Palpations: Abdomen is soft.     Tenderness: There is no abdominal tenderness. There is no guarding or rebound.  Musculoskeletal:        General: Normal range of motion.     Cervical back: Normal range of motion and neck supple.     Comments: No significant edema or calf tenderness  Lymphadenopathy:     Cervical: No cervical adenopathy.  Skin:    General: Skin is warm and dry.     Findings: No rash.  Neurological:     Mental Status: He is alert and oriented to person, place, and time.     (all labs ordered are listed, but only abnormal results are displayed) Labs Reviewed  CBC WITH DIFFERENTIAL/PLATELET - Abnormal; Notable for the following components:      Result Value   RBC 2.43 (*)    Hemoglobin 7.0 (*)    HCT 20.9 (*)    RDW 16.1 (*)    All other components within normal limits  BASIC METABOLIC PANEL WITH GFR - Abnormal; Notable for the following components:   Potassium 3.4 (*)    Chloride 96 (*)    CO2 33 (*)    Creatinine, Ser 4.89 (*)    Calcium  8.4 (*)    GFR, Estimated 13 (*)    All other components within normal limits    EKG: EKG Interpretation Date/Time:  Thursday May 11 2024 16:49:20 EST Ventricular Rate:  88 PR Interval:  169 QRS Duration:  92 QT Interval:  402 QTC Calculation: 487 R Axis:   71  Text Interpretation: Sinus rhythm  Left ventricular hypertrophy Anterior Q waves, possibly due to LVH Nonspecific T abnormalities, lateral leads since last tracing no significant change Confirmed by Lenor Hollering [phone]) on 05/11/2024 5:06:21 PM  Radiology: No results found.   Procedures   Medications Ordered in the ED - No data to display                                  Medical Decision Making Amount  and/or Complexity of Data Reviewed Labs: ordered.  Risk Decision regarding hospitalization.   This patient presents to the ED for concern of low hemoglobin, this involves an extensive number of treatment options, and is a complaint that carries with it a high risk of complications and morbidity.  I considered the following differential and admission for this acute, potentially life threatening condition.  The differential diagnosis includes anemia of chronic disease, GI bleed, error in lab value  MDM:    Patient is a 54 year old who presents with anemia that was found during his dialysis session this morning.  He completed his dialysis session.  He has some fatigue but otherwise is not having other symptoms.  His hemoglobin today is 7.  It looks like his baseline is around 9-10.  Other labs are nonconcerning.  His creatinine is elevated which is baseline.  His potassium is normal.  Discussed with Dr. Judeth who is excepted patient for admission.  (Labs, imaging, consults)  Labs: I Ordered, and personally interpreted labs.  The pertinent results include: Hemoglobin 7  Imaging Studies ordered: I ordered imaging studies including none I independently visualized and interpreted imaging. I agree with the radiologist interpretation  Additional history obtained from family member at bedside.  External records from outside source obtained and reviewed including prior notes  Cardiac Monitoring: The patient was not maintained on a cardiac monitor.  If on the cardiac monitor, I personally viewed and interpreted the cardiac  monitored which showed an underlying rhythm of:    Reevaluation: After the interventions noted above, I reevaluated the patient and found that they have :stayed the same  Social Determinants of Health:    Disposition: Admit to hospital  Co morbidities that complicate the patient evaluation  Past Medical History:  Diagnosis Date   Asthma    Depression    PTSD   Enlarged heart    History of kidney stones    in kidneys - no problems per patient 02/02/20   Hypertension    Peripheral vascular disease    Renal disorder    stage 4   Renal insufficiency    Sickle cell anemia (HCC)    Has the trait-per patient (03/12/15)   Sickle cell trait    Sleep apnea    Per pt on 02/02/20 - uses 2-3 x week as needed when patient can not sleep     Medicines No orders of the defined types were placed in this encounter.   I have reviewed the patients home medicines and have made adjustments as needed  Problem List / ED Course: Problem List Items Addressed This Visit       Other   Anemia - Primary             Final diagnoses:  Anemia, unspecified type    ED Discharge Orders     None          Lenor Hollering, MD 05/11/24 1730    Lenor Hollering, MD 05/11/24 1731  "

## 2024-05-11 NOTE — Progress Notes (Addendum)
 [facility] Transfer Acceptance Note  Transferring MD. Dr. Andrea Ness, EDP Transferring Facility: ED at [facility] Accepted to [facility], Medical Bed, Observation status  54 year old male with history of ESRD on hemodialysis, HTN, sickle cell trait, renal mass awaiting nephrectomy at OSH, sent from hemodialysis unit to ED after completing dialysis due to hemoglobin of 6.9, symptomatic.  No overt bleeding reported.  Vital stable.  Repeat hemoglobin 7.  BMP in keeping with ESRD diagnosis.  Admission for symptomatic anemia and blood transfusion.  Accepted to [facility], medical bed, observation status.  On arrival to [facility], will need H&P and admit orders.  Trenda Mar, MD,  FACP, [facility], [facility], [facility]   [facility] [place]  To contact the attending provider between 7A-7P or the covering provider during after hours 7P-7A, please log into the web site [email or web address] and access using universal [place] password for that web site. If you do not have the password, please call the hospital operator.

## 2024-05-12 LAB — CBC
HCT: 20.7 % — ABNORMAL LOW (ref 39.0–52.0)
HCT: 23.5 % — ABNORMAL LOW (ref 39.0–52.0)
HCT: 23.9 % — ABNORMAL LOW (ref 39.0–52.0)
Hemoglobin: 6.7 g/dL — CL (ref 13.0–17.0)
Hemoglobin: 7.9 g/dL — ABNORMAL LOW (ref 13.0–17.0)
Hemoglobin: 8 g/dL — ABNORMAL LOW (ref 13.0–17.0)
MCH: 27.9 pg (ref 26.0–34.0)
MCH: 28.2 pg (ref 26.0–34.0)
MCH: 28.1 pg (ref 26.0–34.0)
MCHC: 32.4 g/dL (ref 30.0–36.0)
MCHC: 33.6 g/dL (ref 30.0–36.0)
MCHC: 33.5 g/dL (ref 30.0–36.0)
MCV: 86.3 fL (ref 80.0–100.0)
MCV: 83.9 fL (ref 80.0–100.0)
MCV: 83.9 fL (ref 80.0–100.0)
Platelets: 237 K/uL (ref 150–400)
Platelets: 210 K/uL (ref 150–400)
Platelets: 214 K/uL (ref 150–400)
RBC: 2.4 MIL/uL — ABNORMAL LOW (ref 4.22–5.81)
RBC: 2.8 MIL/uL — ABNORMAL LOW (ref 4.22–5.81)
RBC: 2.85 MIL/uL — ABNORMAL LOW (ref 4.22–5.81)
RDW: 15.7 % — ABNORMAL HIGH (ref 11.5–15.5)
RDW: 16.9 % — ABNORMAL HIGH (ref 11.5–15.5)
RDW: 16.9 % — ABNORMAL HIGH (ref 11.5–15.5)
WBC: 7 K/uL (ref 4.0–10.5)
WBC: 6.5 K/uL (ref 4.0–10.5)
WBC: 6.1 K/uL (ref 4.0–10.5)
nRBC: 0 % (ref 0.0–0.2)
nRBC: 0 % (ref 0.0–0.2)
nRBC: 0 % (ref 0.0–0.2)

## 2024-05-12 LAB — PREPARE RBC (CROSSMATCH)

## 2024-05-12 MED ORDER — SODIUM CHLORIDE 0.9% IV SOLUTION: Freq: Once | INTRAVENOUS | Status: AC

## 2024-05-12 MED ORDER — LOSARTAN POTASSIUM 50 MG PO TABS
100.0000 mg | ORAL_TABLET | Freq: Every day | ORAL | Status: DC
  Administered 2024-05-12: 100 mg via ORAL
  Filled 2024-05-12: qty 2

## 2024-05-12 MED ORDER — AMLODIPINE BESYLATE 10 MG PO TABS
10.0000 mg | ORAL_TABLET | Freq: Every day | ORAL | Status: DC
  Administered 2024-05-12: 10 mg via ORAL
  Filled 2024-05-12: qty 1

## 2024-05-12 MED ORDER — MELATONIN 5 MG PO TABS: 5.0000 mg | ORAL_TABLET | Freq: Every evening | ORAL | Status: DC | PRN

## 2024-05-12 MED ORDER — HYDRALAZINE HCL 50 MG PO TABS
100.0000 mg | ORAL_TABLET | Freq: Three times a day (TID) | ORAL | Status: DC
  Administered 2024-05-12 (×2): 100 mg via ORAL
  Filled 2024-05-12 (×2): qty 2

## 2024-05-12 MED ORDER — PROCHLORPERAZINE EDISYLATE 10 MG/2ML IJ SOLN: 5.0000 mg | Freq: Four times a day (QID) | INTRAMUSCULAR | Status: DC | PRN

## 2024-05-12 MED ORDER — ACETAMINOPHEN 500 MG PO TABS: 500.0000 mg | ORAL_TABLET | Freq: Four times a day (QID) | ORAL | Status: DC | PRN

## 2024-05-12 MED ORDER — POLYETHYLENE GLYCOL 3350 17 G PO PACK: 17.0000 g | PACK | Freq: Every day | ORAL | Status: DC | PRN

## 2024-05-12 MED ORDER — LABETALOL HCL 300 MG PO TABS
300.0000 mg | ORAL_TABLET | Freq: Three times a day (TID) | ORAL | Status: DC
  Administered 2024-05-12 (×2): 300 mg via ORAL
  Filled 2024-05-12 (×3): qty 1

## 2024-05-12 MED ORDER — TRAZODONE HCL 50 MG PO TABS
50.0000 mg | ORAL_TABLET | Freq: Every day | ORAL | Status: DC
  Administered 2024-05-12: 50 mg via ORAL
  Filled 2024-05-12: qty 1

## 2024-05-12 MED ORDER — FLUOXETINE HCL 20 MG PO CAPS
20.0000 mg | ORAL_CAPSULE | Freq: Every day | ORAL | Status: DC
  Administered 2024-05-12: 20 mg via ORAL
  Filled 2024-05-12: qty 1

## 2024-05-12 NOTE — H&P (Addendum)
 " History and Physical  Dylan Sweeney FMW:[id number] DOB: [birth year] DOA: 05/11/2024  Referring physician: Accepted by Dr. Hongalgi, [facility], hospitalist service PCP: [facility]  Outpatient Specialists: Nephrology, cardiology, neurology, GI, transplant team [facility]. Patient coming from: Home through hemodialysis center.  Chief Complaint: Abnormal lab with low hemoglobin 6.9.  HPI: Dylan Sweeney is a 54 y.o. male with medical history significant for ESRD on HD TTS, hypertension, history of pericardial effusion managed conservatively by cardiology, renal cell mass (plan for nephrectomy on 05/25/2023 at [facility]), who presents to the ER after being sent from hemodialysis center due to abnormal lab result with hemoglobin of 6.9.  Endorses feeling fatigue for the past few days.  No chest pain.  Denies any overt bleeding.  Denies any fevers or chills.  In the ER at [facility], repeated hemoglobin is 7.0.  EDP requesting admission for blood transfusion.  Excepted by Dr. Murrel cardiology, [facility], hospitalist service.  Transfer to [facility] MedSurg unit as observation status.  At the time of this visit, the patient had no complaints.  ED Course: Temperature 98.4.  BP 152/97, pulse 84, respiratory rate 17, O2 saturation 96% on room air.  Review of Systems: Review of systems as noted in the HPI. All other systems reviewed and are negative.   Past Medical History:  Diagnosis Date   Asthma    Depression    PTSD   Enlarged heart    History of kidney stones    in kidneys - no problems per patient 02/02/20   Hypertension    Peripheral vascular disease    Renal disorder    stage 4   Renal insufficiency    Sickle cell anemia (HCC)    Has the trait-per patient (03/12/15)   Sickle cell trait    Sleep apnea    Per pt on 02/02/20 - uses 2-3 x week as needed when patient can not sleep   Past Surgical History:  Procedure Laterality Date   AV FISTULA  PLACEMENT Left 02/05/2020   Procedure: ARTERIOVENOUS (AV) FISTULA CREATION LEFT;  Surgeon: Gretta Lonni PARAS, MD;  Location: [facility] OR;  Service: Vascular;  Laterality: Left;   NO PAST SURGERIES      Social History:  reports that he has quit smoking. His smoking use included cigarettes. He has never used smokeless tobacco. He reports current alcohol use of about 6.0 standard drinks of alcohol per week. He reports current drug use. Drug: Marijuana.   Allergies[1]  Family History  Problem Relation Age of Onset   Heart disease Mother    Hypertension Sister    Asthma Sister    Hypertension Brother       Prior to Admission medications  Medication Sig Start Date End Date Taking? Authorizing Provider  albuterol  (PROVENTIL  HFA;VENTOLIN  HFA) 108 (90 BASE) MCG/ACT inhaler Inhale 2 puffs into the lungs every 6 (six) hours as needed for wheezing or shortness of breath.     [provider]  amLODipine  (NORVASC ) 10 MG tablet Take 1 tablet (10 mg total) by mouth daily. 03/10/24   Sebastian Toribio GAILS, MD  busPIRone (BUSPAR) 10 MG tablet Take 5 mg by mouth 2 (two) times daily. 01/27/24   [provider]  cinacalcet (SENSIPAR) 30 MG tablet Take 30 mg by mouth See admin instructions. Tuesday, Thursday, and Saturday's. Along with 60mg  for total dose of 90mg .    [provider]  cinacalcet (SENSIPAR) 60 MG tablet Take 60 mg by mouth  daily.    [provider]  folic acid  (FOLVITE ) 1 MG tablet Take 1 tablet (1 mg total) by mouth daily. 03/10/24   Sebastian Toribio GAILS, MD  hydrALAZINE  (APRESOLINE ) 100 MG tablet Take 1 tablet (100 mg total) by mouth every 8 (eight) hours. 03/10/24   Sebastian Toribio GAILS, MD  hydrOXYzine  (ATARAX ) 25 MG tablet Take 25-50 mg by mouth 3 (three) times daily as needed for anxiety. 01/27/24   [provider]  labetalol  (NORMODYNE ) 300 MG tablet Take 300 mg by mouth 3 (three) times daily.    [provider]  losartan  (COZAAR ) 100 MG tablet Take  100 mg by mouth daily.    [provider]  methocarbamol (ROBAXIN) 500 MG tablet Take 500 mg by mouth 2 (two) times daily as needed for muscle spasms. 02/08/24   [provider]  NIFEdipine (ADALAT CC) 60 MG 24 hr tablet Take 120 mg by mouth daily. 08/24/23   [provider]  pantoprazole  (PROTONIX ) 40 MG tablet Take 1 tablet (40 mg total) by mouth daily. 03/11/24   Sebastian Toribio GAILS, MD  sevelamer carbonate (RENVELA) 2.4 g PACK Take 2.4 g by mouth 3 (three) times daily. 12/14/23   [provider]  traMADol (ULTRAM) 50 MG tablet Take 50 mg by mouth every 6 (six) hours as needed for moderate pain (pain score 4-6). 02/22/24   [provider]  vitamin B-12 (CYANOCOBALAMIN ) 500 MCG tablet Take 1 tablet (500 mcg total) by mouth daily. 03/10/24   Sebastian Toribio GAILS, MD    Physical Exam: BP (!) 152/97 (BP Location: Right Arm)   Pulse 84   Temp 98.4 F (36.9 C)   Resp 17   SpO2 96%   General: 54 y.o. year-old male well developed well nourished in no acute distress.  Alert and oriented x3. Cardiovascular: Regular rate and rhythm with no rubs or gallops.  No thyromegaly or JVD noted.  No lower extremity edema. 2/4 pulses in all 4 extremities. Respiratory: Clear to auscultation with no wheezes or rales. Good inspiratory effort. Abdomen: Soft nontender nondistended with normal bowel sounds x4 quadrants. Muskuloskeletal: No cyanosis, clubbing or edema noted bilaterally Neuro: CN II-XII intact, strength, sensation, reflexes Skin: No ulcerative lesions noted or rashes Psychiatry: Judgement and insight appear normal. Mood is appropriate for condition and setting          Labs on Admission:  Basic Metabolic Panel: Recent Labs  Lab 05/11/24 1642  NA 138  K 3.4*  CL 96*  CO2 33*  GLUCOSE 94  BUN 16  CREATININE 4.89*  CALCIUM  8.4*   Liver Function Tests: No results for input(s): AST, ALT, ALKPHOS, BILITOT, PROT, ALBUMIN  in the last 168  hours. No results for input(s): LIPASE, AMYLASE in the last 168 hours. No results for input(s): AMMONIA in the last 168 hours. CBC: Recent Labs  Lab 05/11/24 1642  WBC 7.1  NEUTROABS 4.5  HGB 7.0*  HCT 20.9*  MCV 86.0  PLT 208   Cardiac Enzymes: No results for input(s): CKTOTAL, CKMB, CKMBINDEX, TROPONINI in the last 168 hours.  BNP (last 3 results) No results for input(s): BNP in the last 8760 hours.  ProBNP (last 3 results) Recent Labs    03/09/24 1904  PROBNP 6,186.0*    CBG: No results for input(s): GLUCAP in the last 168 hours.  Radiological Exams on Admission: No results found.  EKG: I independently viewed the EKG done and my findings are as followed: Sinus rhythm rate of 88.  QTc 487.  Assessment/Plan Present on Admission:  Symptomatic anemia  Principal Problem:   Symptomatic anemia  Symptomatic anemia in the setting of ESRD Feeling fatigue with hemoglobin of 6.9, repeat 7.0 with MCV of 86. 1 unit PRBCs ordered to be transfused No overt bleeding. Repeat CBC post blood transfusion.  ESRD on HD TTS Completed session of hemodialysis on Thursday, 05/11/2024. Resume home routine hemodialysis sessions outpatient.  Renal cell mass Plan for nephrectomy on 05/25/2023 at [facility]. Follow-up with transplant team outpatient.  Hypertension Resume home oral antihypertensives Closely monitor vital signs. .  Time: 75 minutes.   DVT prophylaxis: SCDs.  Code Status: Full code.  Family Communication: None at bedside.  Disposition Plan: Admitted to the MedSurg unit.  Consults called: None.  Admission status: Observation status.   Status is: Observation    Terry LOISE Hurst MD [facility] Pager [phone]  If 7PM-7AM, please contact night-coverage [email or web address] Password [facility]  05/12/2024, 12:47 AM      [1]  Allergies Allergen Reactions   Clonidine      Other Reaction(s): skin burns   Ace Inhibitors Cough  and Other (See Comments)    Other Reaction(s): Cough  Other Reaction(s): cough  Couldn't stop coughing   Nsaids Other (See Comments)    Other Reaction(s): Serum creatinine above reference range   Ketorolac  Tromethamine      Other Reaction(s): Serum creatinine above reference range   Lisinopril Cough   Other Other (See Comments)    G6 PD pill allergy : pill given before travel in [employer] (reaction?)   "

## 2024-05-12 NOTE — Plan of Care (Signed)

## 2024-05-12 NOTE — Progress Notes (Signed)
 Discharge Nurse Summary: DC order noted per MD. DC RN at bedside with patient. Patient agreeable with discharge plan, family at the bedside. AVS printed/reviewed. PIV removed, skin intact. No DME needs. No home/TOC. CP/Edu resolved. Telemonitor not present on assessment. All belongings accounted for. Patient wheeled downstairs for discharge by private auto.   Rosario EMERSON Lund, RN

## 2024-05-12 NOTE — Discharge Summary (Signed)
 " Physician Discharge Summary   Patient: Dylan Sweeney MRN: [id number] DOB: [birth year]  Admit date:     05/11/2024  Discharge date: 05/12/2024  Discharge Physician: Drue ONEIDA Potter   PCP: [facility]   Recommendations at discharge:  Follow-up with outpatient nephrologist  Discharge Diagnoses:  Symptomatic anemia in the setting of ESRD ESRD on HD TTS Renal cell mass Hypertension  Hospital Course:   Dylan Sweeney is a 54 y.o. male with medical history significant for ESRD on HD TTS, hypertension, history of pericardial effusion managed conservatively by cardiology, renal cell mass (plan for nephrectomy on 05/25/2023 at [facility]), who presents to the ER after being sent from hemodialysis center due to abnormal lab result with hemoglobin of 6.9.  Endorses feeling fatigue for the past few days.  No chest pain.  Denies any overt bleeding.  Denies any fevers or chills.   In the ER at [facility], repeated hemoglobin is 7.0.  EDP requesting admission for blood transfusion.  Excepted by Dr. Murrel cardiology, [facility], hospitalist service.  Transfer to [facility] MedSurg unit as observation status.   At the time of this visit, the patient had no complaints.   Patient underwent blood transfusion.  No signs of bleeding noted Blood counts have remained stable and therefore patient cleared for discharge today   Consultants: None Procedures performed: None Disposition: Home Diet recommendation:  Cardiac diet DISCHARGE MEDICATION: Allergies as of 05/12/2024       Reactions   Clonidine  Other (See Comments)   Skin burns   Ace Inhibitors Other (See Comments), Cough   Couldn't stop coughing   Nsaids Other (See Comments)    Serum creatinine above reference range   Ketorolac  Tromethamine  Other (See Comments)   Serum creatinine above reference range   Lisinopril Cough   Other Other (See Comments)   G6 PD pill allergy : pill given before travel in [employer] (reaction?)         Medication List     STOP taking these medications    busPIRone 10 MG tablet Commonly known as: BUSPAR       TAKE these medications    albuterol  108 (90 Base) MCG/ACT inhaler Commonly known as: VENTOLIN  HFA Inhale 2 puffs into the lungs every 6 (six) hours as needed for wheezing or shortness of breath.   cinacalcet 30 MG tablet Commonly known as: SENSIPAR Take 30 mg by mouth See admin instructions. Take 30 mg by mouth on Sunday, Monday, Wednesday, and Friday   cinacalcet 60 MG tablet Commonly known as: SENSIPAR Take 60 mg by mouth See admin instructions. Take 60 mg by mouth once daily on Tuesday, Thursday, and Saturday   FLUoxetine  20 MG capsule Commonly known as: PROZAC  Take 20 mg by mouth at bedtime.   hydrALAZINE  100 MG tablet Commonly known as: APRESOLINE  Take 1 tablet (100 mg total) by mouth every 8 (eight) hours. What changed: when to take this   labetalol  300 MG tablet Commonly known as: NORMODYNE  Take 300 mg by mouth 3 (three) times daily.   losartan  100 MG tablet Commonly known as: COZAAR  Take 100 mg by mouth daily.   NIFEdipine 60 MG 24 hr tablet Commonly known as: ADALAT CC Take 60 mg by mouth every evening.   pantoprazole  40 MG tablet Commonly known as: PROTONIX  Take 1 tablet (40 mg total) by mouth daily. What changed: when to take this   sevelamer carbonate 2.4 g Pack Commonly known as: RENVELA Take 2.4 g by mouth  3 (three) times daily.   traMADol 50 MG tablet Commonly known as: ULTRAM Take 50 mg by mouth at bedtime.   traZODone  50 MG tablet Commonly known as: DESYREL  Take 50 mg by mouth at bedtime.        Discharge Exam: Patient seen and examined at bedside this morning General: In no acute distress Abdomen: Nontender CVS: S1-S2 present and normal  Condition at discharge: good  The results of significant diagnostics from this hospitalization (including imaging, microbiology, ancillary and laboratory) are listed below for  reference.   Imaging Studies: No results found.  Microbiology: Results for orders placed or performed during the hospital encounter of 03/09/24  Culture, blood (Routine X 2) w Reflex to ID Panel     Status: None   Collection Time: 03/09/24  7:11 PM   Specimen: BLOOD  Result Value Ref Range Status   Specimen Description   Final    BLOOD RIGHT ANTECUBITAL Performed at [facility], [address], [place], [place] [address]    Special Requests   Final    BOTTLES DRAWN AEROBIC AND ANAEROBIC Blood Culture results may not be optimal due to an inadequate volume of blood received in culture bottles Performed at [facility], [address] [place], [place] [address]    Culture   Final    NO GROWTH 5 DAYS Performed at [facility], [address] [place], [place] [address]    Report Status 03/14/2024 FINAL  Final  Culture, blood (Routine X 2) w Reflex to ID Panel     Status: None   Collection Time: 03/09/24  7:13 PM   Specimen: BLOOD  Result Value Ref Range Status   Specimen Description   Final    BLOOD BLOOD RIGHT FOREARM Performed at [facility], [address] [place], [place] [address]    Special Requests   Final    BOTTLES DRAWN AEROBIC AND ANAEROBIC Blood Culture adequate volume Performed at [facility], [address] [place], [place] [address]    Culture   Final    NO GROWTH 5 DAYS Performed at [facility], [address], [place], [place] [address]    Report Status 03/14/2024 FINAL  Final    Labs: CBC: Recent Labs  Lab 05/11/24 1642 05/12/24 0114 05/12/24 0627 05/12/24 1400  WBC 7.1 7.0 6.5 6.1  NEUTROABS 4.5  --   --   --   HGB 7.0* 6.7* 7.9* 8.0*  HCT 20.9* 20.7* 23.5* 23.9*  MCV 86.0 86.3 83.9 83.9  PLT 208 237 210 214   Basic Metabolic Panel: Recent Labs  Lab 05/11/24 1642  NA 138  K 3.4*  CL 96*  CO2 33*  GLUCOSE 94  BUN 16  CREATININE 4.89*   CALCIUM  8.4*   Liver Function Tests: No results for input(s): AST, ALT, ALKPHOS, BILITOT, PROT, ALBUMIN  in the last 168 hours. CBG: No results for input(s): GLUCAP in the last 168 hours.  Discharge time spent:  37 minutes.  Signed: Drue ONEIDA Potter, MD [facility] 05/12/2024 "

## 2024-05-12 NOTE — Care Management (Signed)
" °  Transition of Care [facility]) Screening Note   Patient Details  Name: Dylan Sweeney Date of Birth: [birth year]   Transition [facility]) CM/SW Contact:    Corean JAYSON Canary, RN Phone Number: 05/12/2024, 2:56 PM    Transition of Care Department [facility]) has reviewed patient and no TOC needs have been identified at this time. We will continue to monitor patient advancement through interdisciplinary progression rounds. If new patient transition needs arise, please place a TOC consult.   "

## 2024-05-13 LAB — TYPE AND SCREEN
ABO/RH(D): O POS
Antibody Screen: NEGATIVE
Unit division: 0

## 2024-05-13 LAB — BPAM RBC
Blood Product Expiration Date: 202601282359
ISSUE DATE / TIME: 202601090248
Unit Type and Rh: 5100

## 2024-05-19 ENCOUNTER — Encounter (HOSPITAL_COMMUNITY): Payer: Self-pay
